# Patient Record
Sex: Female | Born: 1985
Health system: Southern US, Community
[De-identification: ages and names within clinical notes are randomized; demographics above are authoritative.]

## PROBLEM LIST (undated history)

## (undated) DIAGNOSIS — G4730 Sleep apnea, unspecified: Secondary | ICD-10-CM

## (undated) DIAGNOSIS — C189 Malignant neoplasm of colon, unspecified: Secondary | ICD-10-CM

## (undated) DIAGNOSIS — T7840XA Allergy, unspecified, initial encounter: Secondary | ICD-10-CM

## (undated) DIAGNOSIS — J45909 Unspecified asthma, uncomplicated: Secondary | ICD-10-CM

## (undated) DIAGNOSIS — K8020 Calculus of gallbladder without cholecystitis without obstruction: Secondary | ICD-10-CM

## (undated) DIAGNOSIS — K635 Polyp of colon: Secondary | ICD-10-CM

## (undated) DIAGNOSIS — C20 Malignant neoplasm of rectum: Secondary | ICD-10-CM

## (undated) DIAGNOSIS — G4710 Hypersomnia, unspecified: Secondary | ICD-10-CM

## (undated) DIAGNOSIS — E669 Obesity, unspecified: Secondary | ICD-10-CM

## (undated) DIAGNOSIS — K56609 Unspecified intestinal obstruction, unspecified as to partial versus complete obstruction: Secondary | ICD-10-CM

## (undated) HISTORY — PX: COLON SURGERY: SHX602

## (undated) HISTORY — DX: Polyp of colon: K63.5

## (undated) HISTORY — DX: Calculus of gallbladder without cholecystitis without obstruction: K80.20

## (undated) HISTORY — PX: HYSTEROTOMY: SHX1776

## (undated) HISTORY — DX: Sleep apnea, unspecified: G47.30

## (undated) HISTORY — PX: ABDOMINAL HYSTERECTOMY: SHX81

## (undated) HISTORY — DX: Obesity, unspecified: E66.9

## (undated) HISTORY — DX: Malignant neoplasm of colon, unspecified: C18.9

## (undated) HISTORY — PX: CHOLECYSTECTOMY: SHX55

## (undated) HISTORY — DX: Allergy, unspecified, initial encounter: T78.40XA

## (undated) HISTORY — PX: HERNIA REPAIR: SHX51

## (undated) HISTORY — PX: OTHER SURGICAL HISTORY: SHX169

## (undated) HISTORY — DX: Hypersomnia, unspecified: G47.10

## (undated) HISTORY — DX: Unspecified intestinal obstruction, unspecified as to partial versus complete obstruction: K56.609

## (undated) HISTORY — PX: TONSILLECTOMY AND ADENOIDECTOMY: SUR1326

---
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
## 2007-02-22 ENCOUNTER — Ambulatory Visit: Payer: Self-pay

## 2011-09-30 HISTORY — PX: OTHER SURGICAL HISTORY: SHX169

## 2011-09-30 HISTORY — PX: ABDOMINAL HYSTERECTOMY: SHX81

## 2011-09-30 HISTORY — PX: COLON SURGERY: SHX602

## 2011-11-04 ENCOUNTER — Telehealth: Payer: Self-pay | Admitting: Internal Medicine

## 2011-11-04 NOTE — Telephone Encounter (Signed)
[phone] Pt called has a new patient appointment 3/4 would like to be seen sooner if possible Pt having diagestive issues,  Going to bath room several times a day very painful   Pt started gulton free diet to see if this would help 3 weeks ago. Pt does not have primary care dr

## 2011-11-04 NOTE — Telephone Encounter (Signed)
Can I assume you have not found a way to work her in using two 15 minute slots ?

## 2011-11-05 NOTE — Telephone Encounter (Signed)
Appointment rescheduled.

## 2011-11-12 ENCOUNTER — Ambulatory Visit (INDEPENDENT_AMBULATORY_CARE_PROVIDER_SITE_OTHER): Payer: Self-pay | Admitting: Internal Medicine

## 2011-11-12 ENCOUNTER — Encounter: Payer: Self-pay | Admitting: *Deleted

## 2011-11-12 ENCOUNTER — Encounter: Payer: Self-pay | Admitting: Internal Medicine

## 2011-11-12 DIAGNOSIS — G4730 Sleep apnea, unspecified: Secondary | ICD-10-CM | POA: Insufficient documentation

## 2011-11-12 DIAGNOSIS — R197 Diarrhea, unspecified: Secondary | ICD-10-CM

## 2011-11-12 DIAGNOSIS — K529 Noninfective gastroenteritis and colitis, unspecified: Secondary | ICD-10-CM | POA: Insufficient documentation

## 2011-11-12 DIAGNOSIS — F5112 Insufficient sleep syndrome: Secondary | ICD-10-CM | POA: Insufficient documentation

## 2011-11-12 DIAGNOSIS — G4710 Hypersomnia, unspecified: Secondary | ICD-10-CM | POA: Insufficient documentation

## 2011-11-12 LAB — CBC WITH DIFFERENTIAL/PLATELET
Basophils Absolute: 0 10*3/uL (ref 0.0–0.1)
Basophils Relative: 0.4 % (ref 0.0–3.0)
Eosinophils Absolute: 0.2 10*3/uL (ref 0.0–0.7)
Eosinophils Relative: 3.6 % (ref 0.0–5.0)
HCT: 36.9 % (ref 36.0–46.0)
Hemoglobin: 12.1 g/dL (ref 12.0–15.0)
Lymphocytes Relative: 31 % (ref 12.0–46.0)
Lymphs Abs: 1.8 10*3/uL (ref 0.7–4.0)
MCHC: 32.8 g/dL (ref 30.0–36.0)
MCV: 86.1 fl (ref 78.0–100.0)
Monocytes Absolute: 0.4 10*3/uL (ref 0.1–1.0)
Monocytes Relative: 7.3 % (ref 3.0–12.0)
Neutro Abs: 3.4 10*3/uL (ref 1.4–7.7)
Neutrophils Relative %: 57.7 % (ref 43.0–77.0)
Platelets: 219 10*3/uL (ref 150.0–400.0)
RBC: 4.29 Mil/uL (ref 3.87–5.11)
RDW: 14.3 % (ref 11.5–14.6)
WBC: 6 10*3/uL (ref 4.5–10.5)

## 2011-11-12 LAB — COMPREHENSIVE METABOLIC PANEL
ALT: 18 U/L (ref 0–35)
AST: 19 U/L (ref 0–37)
Albumin: 3.9 g/dL (ref 3.5–5.2)
Alkaline Phosphatase: 57 U/L (ref 39–117)
BUN: 14 mg/dL (ref 6–23)
CO2: 24 mEq/L (ref 19–32)
Calcium: 9.2 mg/dL (ref 8.4–10.5)
Chloride: 105 mEq/L (ref 96–112)
Creatinine, Ser: 0.8 mg/dL (ref 0.4–1.2)
GFR: 91.03 mL/min (ref >60.00)
Glucose, Bld: 84 mg/dL (ref 70–99)
Potassium: 3.8 mEq/L (ref 3.5–5.1)
Sodium: 138 mEq/L (ref 135–145)
Total Bilirubin: 0.6 mg/dL (ref 0.3–1.2)
Total Protein: 6.9 g/dL (ref 6.0–8.3)

## 2011-11-12 LAB — MAGNESIUM: Magnesium: 1.9 mg/dL (ref 1.5–2.5)

## 2011-11-12 LAB — TSH: TSH: 0.7 u[IU]/mL (ref 0.35–5.50)

## 2011-11-12 LAB — SEDIMENTATION RATE: Sed Rate: 21 mm/hr (ref 0–22)

## 2011-11-12 MED ORDER — DIPHENOXYLATE-ATROPINE 2.5-0.025 MG PO TABS: 1.0000 | ORAL_TABLET | Freq: Four times a day (QID) | ORAL | Status: AC | PRN

## 2011-11-12 NOTE — Assessment & Plan Note (Signed)
Prior trials of Ritalin in college not helpful.  Now using metidate ., prescribed by Dr. Hazle Coca at [facility].

## 2011-11-12 NOTE — Progress Notes (Signed)
  Subjective:    Patient ID: Rachael Cowan, female    DOB: [birth year], 26 y.o.   MRN: [id number]  HPI  Rachael Cowan is a 26 year old white female who presents today to establish primary care. Chief complaint is chronic diarrhea which has been present since June 2012,  she has been having  8 to 10 ,  small volume liquid stools daily since Junew With no prior history of diarrhea or constipation, there is no family history of irritable bowel disease or inflammatory bowel disease she has occasionally noticed some blood  mixe d in with stools.   She has lost approximately 10 pounds over the last several weeks by switching to a gluten-free diet. The dietary changes have not affect the diarrhea very much she continues to have numerous stools daily . there is no history of  prior travel or camping and  she has not been  not sexually active since before the diarrhea began .  she did not not grow up on a farm.  She has  city water,   she has a long list of environmental allergies and was previously received allergy shots, but has not been taking any antihistamines for several months and  Past Medical History  Diagnosis Date  . Allergy   . Sleep apnea 2005    resolved with ENT surgery,  Erline Hau  . Hypersomnolence disorder 2005    managed with metidate   No current outpatient prescriptions on file prior to visit.    .      Review of Systems  Constitutional: Negative for fever, chills and unexpected weight change.  HENT: Negative for hearing loss, ear pain, nosebleeds, congestion, sore throat, facial swelling, rhinorrhea, sneezing, mouth sores, trouble swallowing, neck pain, neck stiffness, voice change, postnasal drip, sinus pressure, tinnitus and ear discharge.   Eyes: Negative for pain, discharge, redness and visual disturbance.  Respiratory: Negative for cough, chest tightness, shortness of breath, wheezing and stridor.   Cardiovascular: Negative for chest pain, palpitations and leg swelling.    Gastrointestinal: Positive for diarrhea.  Musculoskeletal: Negative for myalgias and arthralgias.  Skin: Negative for color change.  Neurological: Negative for dizziness, weakness, light-headedness and headaches.  Hematological: Negative for adenopathy.  Psychiatric/Behavioral: Positive for sleep disturbance.       Objective:   Physical Exam  Constitutional: She is oriented to person, place, and time. She appears well-developed and well-nourished.  HENT:  Mouth/Throat: Oropharynx is clear and moist.  Eyes: EOM are normal. Pupils are equal, round, and reactive to light. No scleral icterus.  Neck: Normal range of motion. Neck supple. No JVD present. No thyromegaly present.  Cardiovascular: Normal rate, regular rhythm, normal heart sounds and intact distal pulses.   Pulmonary/Chest: Effort normal and breath sounds normal.  Abdominal: Soft. Bowel sounds are normal. She exhibits no mass. There is no tenderness.  Musculoskeletal: Normal range of motion. She exhibits no edema.  Lymphadenopathy:    She has no cervical adenopathy.  Neurological: She is alert and oriented to person, place, and time.  Skin: Skin is warm and dry.  Psychiatric: She has a normal mood and affect.          Assessment & Plan:

## 2011-11-12 NOTE — Patient Instructions (Signed)
We will call you with the referral to the GI clinic for evaluation of your diarrhea and will call you with the results of your blood and stool tests  You may use the lomotil up to 4 times daily to control your diarrhea.

## 2011-11-14 ENCOUNTER — Telehealth: Payer: Self-pay | Admitting: *Deleted

## 2011-11-14 ENCOUNTER — Encounter: Payer: Self-pay | Admitting: Internal Medicine

## 2011-11-14 ENCOUNTER — Other Ambulatory Visit: Payer: BC Managed Care – PPO

## 2011-11-14 DIAGNOSIS — K529 Noninfective gastroenteritis and colitis, unspecified: Secondary | ICD-10-CM

## 2011-11-14 DIAGNOSIS — Z1211 Encounter for screening for malignant neoplasm of colon: Secondary | ICD-10-CM

## 2011-11-14 LAB — CELIAC DISEASE AB SCREEN W/RFX
Deamidated Gliadin Abs, IgA: 2 U (ref 0–19)
Immunoglobulin A, (IgA) QN, Serum: 116 mg/dL (ref 91–414)
t-Transglutaminase (tTG) IgA: <2 U/mL (ref 0–3)

## 2011-11-14 LAB — FECAL OCCULT BLOOD, IMMUNOCHEMICAL: Fecal Occult Bld: POSITIVE

## 2011-11-14 LAB — C-REACTIVE PROTEIN: CRP: 6.9 mg/L — ABNORMAL HIGH (ref 0.0–4.9)

## 2011-11-14 NOTE — Assessment & Plan Note (Signed)
Etiology is unclear and given the recurrence of blood in her stools inflammatory bowel disorder versus anal fissure could be at play here. Will check stool studies, electrolytes,  renal function,  thyroid function,  celiac panel and refer to GI for endoscopy. And given her prescription for Lomotil which she can use up to 4 daily.

## 2011-11-14 NOTE — Telephone Encounter (Signed)
NOted

## 2011-11-14 NOTE — Telephone Encounter (Signed)
Nothing to do.  We knew she was having blood in her stools,.  She has other labs pending and is being referred to GI for evaluation

## 2011-11-14 NOTE — Assessment & Plan Note (Signed)
By history, resolved with surgery on her soft palate. She has no symptoms of sleep apnea currently.

## 2011-11-14 NOTE — Telephone Encounter (Signed)
Got call from lab stating that patient's recent IFOB (stool test) was positive. They will be faxing over result.  Please advise.

## 2011-12-01 ENCOUNTER — Ambulatory Visit: Payer: Self-pay | Admitting: Internal Medicine

## 2011-12-03 LAB — GIARDIA ANTIGEN: Giardia Screen (EIA): NEGATIVE

## 2011-12-03 LAB — FECAL LACTOFERRIN, QUANT: Lactoferrin: POSITIVE

## 2011-12-06 LAB — STOOL CULTURE

## 2011-12-10 ENCOUNTER — Ambulatory Visit: Payer: Self-pay | Admitting: Internal Medicine

## 2012-01-05 ENCOUNTER — Other Ambulatory Visit: Payer: Self-pay

## 2012-01-05 ENCOUNTER — Ambulatory Visit: Payer: Self-pay | Admitting: Oncology

## 2012-01-05 ENCOUNTER — Telehealth: Payer: Self-pay

## 2012-01-05 DIAGNOSIS — C20 Malignant neoplasm of rectum: Secondary | ICD-10-CM

## 2012-01-05 LAB — CBC CANCER CENTER
Basophil #: 0 x10 3/mm (ref 0.0–0.1)
Basophil %: 0.4 %
Eosinophil #: 0.1 x10 3/mm (ref 0.0–0.7)
Eosinophil %: 2.4 %
HCT: 36.4 % (ref 35.0–47.0)
HGB: 12.1 g/dL (ref 12.0–16.0)
Lymphocyte #: 1.8 x10 3/mm (ref 1.0–3.6)
Lymphocyte %: 31.3 %
MCH: 27.6 pg (ref 26.0–34.0)
MCHC: 33.4 g/dL (ref 32.0–36.0)
MCV: 83 fL (ref 80–100)
Monocyte #: 0.5 x10 3/mm (ref 0.0–0.7)
Monocyte %: 8.2 %
Neutrophil #: 3.3 x10 3/mm (ref 1.4–6.5)
Neutrophil %: 57.7 %
Platelet: 245 x10 3/mm (ref 150–440)
RBC: 4.39 10*6/uL (ref 3.80–5.20)
RDW: 15.2 % — ABNORMAL HIGH (ref 11.5–14.5)
WBC: 5.8 x10 3/mm (ref 3.6–11.0)

## 2012-01-05 LAB — COMPREHENSIVE METABOLIC PANEL
Albumin: 3.7 g/dL (ref 3.4–5.0)
Alkaline Phosphatase: 70 U/L (ref 50–136)
Anion Gap: 10 (ref 7–16)
BUN: 15 mg/dL (ref 7–18)
Bilirubin,Total: 0.7 mg/dL (ref 0.2–1.0)
Calcium, Total: 9 mg/dL (ref 8.5–10.1)
Chloride: 105 mmol/L (ref 98–107)
Co2: 27 mmol/L (ref 21–32)
Creatinine: 0.8 mg/dL (ref 0.60–1.30)
EGFR (African American): >60
EGFR (Non-African Amer.): >60
Glucose: 85 mg/dL (ref 65–99)
Osmolality: 283 (ref 275–301)
Potassium: 3.5 mmol/L (ref 3.5–5.1)
SGOT(AST): 19 U/L (ref 15–37)
SGPT (ALT): 26 U/L
Sodium: 142 mmol/L (ref 136–145)
Total Protein: 7 g/dL (ref 6.4–8.2)

## 2012-01-05 NOTE — Telephone Encounter (Signed)
Pt has been instructed and meds reviewed.  She will call with any further questions or concerns.

## 2012-01-06 LAB — CEA: CEA: 19.8 ng/mL — ABNORMAL HIGH (ref 0.0–4.7)

## 2012-01-07 ENCOUNTER — Telehealth: Payer: Self-pay | Admitting: Internal Medicine

## 2012-01-07 DIAGNOSIS — K624 Stenosis of anus and rectum: Secondary | ICD-10-CM

## 2012-01-08 ENCOUNTER — Telehealth: Payer: Self-pay | Admitting: Gastroenterology

## 2012-01-08 ENCOUNTER — Telehealth: Payer: Self-pay | Admitting: Internal Medicine

## 2012-01-08 ENCOUNTER — Telehealth: Payer: Self-pay

## 2012-01-08 NOTE — Telephone Encounter (Signed)
Shawn called and cx the pt for tomorrow she is going to [facility] instead

## 2012-01-08 NOTE — Telephone Encounter (Signed)
Pt's mother had a question about where the pt's procedure will be. I explained to her it would be at [facility] and we discussed her instructions she had no other questions

## 2012-01-08 NOTE — Telephone Encounter (Signed)
I received her colonoscopy report but not the path report on the stricture Dr. Mechele Collin found.  Has his office contacted her with the results ?

## 2012-01-09 ENCOUNTER — Ambulatory Visit (HOSPITAL_COMMUNITY)
Admission: RE | Admit: 2012-01-09 | Payer: BC Managed Care – PPO | Source: Ambulatory Visit | Admitting: Gastroenterology

## 2012-01-09 ENCOUNTER — Encounter (HOSPITAL_COMMUNITY): Admission: RE | Payer: Self-pay | Source: Ambulatory Visit

## 2012-01-09 SURGERY — ULTRASOUND, LOWER GI TRACT, ENDOSCOPIC
Anesthesia: Moderate Sedation

## 2012-01-09 NOTE — Telephone Encounter (Signed)
Ok, thanks  Please let the referring provider know (Dr. Doylene Canning at [place])

## 2012-01-09 NOTE — Telephone Encounter (Signed)
I tried calling patient but she does not have a voicemail set up.  I will try again.

## 2012-01-09 NOTE — Telephone Encounter (Signed)
appt cx with anna at [facility]

## 2012-01-12 NOTE — Telephone Encounter (Signed)
Patient does not have voicemail set up, still no answer. Will try calling again later.

## 2012-01-13 ENCOUNTER — Encounter: Payer: Self-pay | Admitting: Internal Medicine

## 2012-01-13 DIAGNOSIS — Z85048 Personal history of other malignant neoplasm of rectum, rectosigmoid junction, and anus: Secondary | ICD-10-CM | POA: Insufficient documentation

## 2012-01-13 DIAGNOSIS — C20 Malignant neoplasm of rectum: Secondary | ICD-10-CM

## 2012-01-13 NOTE — Assessment & Plan Note (Signed)
Found on March 2013 colonoscopy for bloody diarrhea.  Referred to [facility] andd Mosca,  With plans for referral to [facility] for surgery. Staging underway

## 2012-01-13 NOTE — Telephone Encounter (Signed)
OPened in error.

## 2012-01-14 ENCOUNTER — Ambulatory Visit: Payer: Self-pay | Admitting: Oncology

## 2012-01-14 NOTE — Telephone Encounter (Signed)
I got in touch with patients mother and she stated patient has been contacted and she met with Dr. Koleen Nimrod last week and she is at [facility] right now meeting with another doctor, patient does have rectal cancer.

## 2012-01-22 ENCOUNTER — Ambulatory Visit: Payer: Self-pay | Admitting: Vascular Surgery

## 2012-01-22 LAB — HCG, QUANTITATIVE, PREGNANCY: Beta Hcg, Quant.: <1 m[IU]/mL — ABNORMAL LOW

## 2012-01-28 ENCOUNTER — Ambulatory Visit: Payer: Self-pay | Admitting: Oncology

## 2012-02-02 LAB — CBC CANCER CENTER
Basophil #: 0 x10 3/mm (ref 0.0–0.1)
Basophil %: 0.6 %
Eosinophil #: 0.2 x10 3/mm (ref 0.0–0.7)
Eosinophil %: 3.5 %
HCT: 36.3 % (ref 35.0–47.0)
HGB: 11.4 g/dL — ABNORMAL LOW (ref 12.0–16.0)
Lymphocyte #: 1.9 x10 3/mm (ref 1.0–3.6)
Lymphocyte %: 27 %
MCH: 26.7 pg (ref 26.0–34.0)
MCHC: 31.4 g/dL — ABNORMAL LOW (ref 32.0–36.0)
MCV: 85 fL (ref 80–100)
Monocyte #: 0.5 x10 3/mm (ref 0.2–0.9)
Monocyte %: 6.6 %
Neutrophil #: 4.4 x10 3/mm (ref 1.4–6.5)
Neutrophil %: 62.3 %
Platelet: 180 x10 3/mm (ref 150–440)
RBC: 4.26 10*6/uL (ref 3.80–5.20)
RDW: 15.8 % — ABNORMAL HIGH (ref 11.5–14.5)
WBC: 7.1 x10 3/mm (ref 3.6–11.0)

## 2012-02-02 LAB — COMPREHENSIVE METABOLIC PANEL
Albumin: 3.2 g/dL — ABNORMAL LOW (ref 3.4–5.0)
Alkaline Phosphatase: 77 U/L (ref 50–136)
Anion Gap: 7 (ref 7–16)
BUN: 15 mg/dL (ref 7–18)
Bilirubin,Total: 0.2 mg/dL (ref 0.2–1.0)
Calcium, Total: 8.7 mg/dL (ref 8.5–10.1)
Chloride: 109 mmol/L — ABNORMAL HIGH (ref 98–107)
Co2: 27 mmol/L (ref 21–32)
Creatinine: 0.84 mg/dL (ref 0.60–1.30)
EGFR (African American): >60
EGFR (Non-African Amer.): >60
Glucose: 100 mg/dL — ABNORMAL HIGH (ref 65–99)
Osmolality: 286 (ref 275–301)
Potassium: 4 mmol/L (ref 3.5–5.1)
SGOT(AST): 13 U/L — ABNORMAL LOW (ref 15–37)
SGPT (ALT): 27 U/L
Sodium: 143 mmol/L (ref 136–145)
Total Protein: 6.4 g/dL (ref 6.4–8.2)

## 2012-02-09 LAB — CBC CANCER CENTER
Basophil #: 0 x10 3/mm (ref 0.0–0.1)
Basophil %: 0.6 %
Eosinophil #: 0.1 x10 3/mm (ref 0.0–0.7)
Eosinophil %: 3.6 %
HCT: 35.1 % (ref 35.0–47.0)
HGB: 11.1 g/dL — ABNORMAL LOW (ref 12.0–16.0)
Lymphocyte #: 0.6 x10 3/mm — ABNORMAL LOW (ref 1.0–3.6)
Lymphocyte %: 19.2 %
MCH: 27 pg (ref 26.0–34.0)
MCHC: 31.6 g/dL — ABNORMAL LOW (ref 32.0–36.0)
MCV: 85 fL (ref 80–100)
Monocyte #: 0.3 x10 3/mm (ref 0.2–0.9)
Monocyte %: 8.6 %
Neutrophil #: 2.2 x10 3/mm (ref 1.4–6.5)
Neutrophil %: 68 %
Platelet: 160 x10 3/mm (ref 150–440)
RBC: 4.11 10*6/uL (ref 3.80–5.20)
RDW: 16.3 % — ABNORMAL HIGH (ref 11.5–14.5)
WBC: 3.2 x10 3/mm — ABNORMAL LOW (ref 3.6–11.0)

## 2012-02-16 LAB — CBC CANCER CENTER
Basophil #: 0 x10 3/mm (ref 0.0–0.1)
Basophil %: 0.5 %
Eosinophil #: 0.1 x10 3/mm (ref 0.0–0.7)
Eosinophil %: 5.4 %
HCT: 36.3 % (ref 35.0–47.0)
HGB: 11.6 g/dL — ABNORMAL LOW (ref 12.0–16.0)
Lymphocyte #: 0.5 x10 3/mm — ABNORMAL LOW (ref 1.0–3.6)
Lymphocyte %: 18.1 %
MCH: 27.1 pg (ref 26.0–34.0)
MCHC: 31.9 g/dL — ABNORMAL LOW (ref 32.0–36.0)
MCV: 85 fL (ref 80–100)
Monocyte #: 0.3 x10 3/mm (ref 0.2–0.9)
Monocyte %: 9.9 %
Neutrophil #: 1.7 x10 3/mm (ref 1.4–6.5)
Neutrophil %: 66.1 %
Platelet: 134 x10 3/mm — ABNORMAL LOW (ref 150–440)
RBC: 4.28 10*6/uL (ref 3.80–5.20)
RDW: 16.7 % — ABNORMAL HIGH (ref 11.5–14.5)
WBC: 2.6 x10 3/mm — ABNORMAL LOW (ref 3.6–11.0)

## 2012-02-24 LAB — CBC CANCER CENTER
Basophil #: 0 x10 3/mm (ref 0.0–0.1)
Basophil %: 0.5 %
Eosinophil #: 0.1 x10 3/mm (ref 0.0–0.7)
Eosinophil %: 4.3 %
HCT: 36.6 % (ref 35.0–47.0)
HGB: 11.7 g/dL — ABNORMAL LOW (ref 12.0–16.0)
Lymphocyte #: 0.3 x10 3/mm — ABNORMAL LOW (ref 1.0–3.6)
Lymphocyte %: 10.9 %
MCH: 27.2 pg (ref 26.0–34.0)
MCHC: 32.1 g/dL (ref 32.0–36.0)
MCV: 85 fL (ref 80–100)
Monocyte #: 0.3 x10 3/mm (ref 0.2–0.9)
Monocyte %: 11.3 %
Neutrophil #: 2 x10 3/mm (ref 1.4–6.5)
Neutrophil %: 73 %
Platelet: 125 x10 3/mm — ABNORMAL LOW (ref 150–440)
RBC: 4.32 10*6/uL (ref 3.80–5.20)
RDW: 17.4 % — ABNORMAL HIGH (ref 11.5–14.5)
WBC: 2.8 x10 3/mm — ABNORMAL LOW (ref 3.6–11.0)

## 2012-02-28 ENCOUNTER — Ambulatory Visit: Payer: Self-pay | Admitting: Oncology

## 2012-03-01 LAB — CBC CANCER CENTER
Basophil #: 0 x10 3/mm (ref 0.0–0.1)
Basophil %: 0.6 %
Eosinophil #: 0.1 x10 3/mm (ref 0.0–0.7)
Eosinophil %: 4.6 %
HCT: 36.8 % (ref 35.0–47.0)
HGB: 11.7 g/dL — ABNORMAL LOW (ref 12.0–16.0)
Lymphocyte #: 0.3 x10 3/mm — ABNORMAL LOW (ref 1.0–3.6)
Lymphocyte %: 8.4 %
MCH: 27.1 pg (ref 26.0–34.0)
MCHC: 31.7 g/dL — ABNORMAL LOW (ref 32.0–36.0)
MCV: 85 fL (ref 80–100)
Monocyte #: 0.3 x10 3/mm (ref 0.2–0.9)
Monocyte %: 8.5 %
Neutrophil #: 2.3 x10 3/mm (ref 1.4–6.5)
Neutrophil %: 77.9 %
Platelet: 129 x10 3/mm — ABNORMAL LOW (ref 150–440)
RBC: 4.32 10*6/uL (ref 3.80–5.20)
RDW: 17.8 % — ABNORMAL HIGH (ref 11.5–14.5)
WBC: 3 x10 3/mm — ABNORMAL LOW (ref 3.6–11.0)

## 2012-03-08 LAB — CBC CANCER CENTER
Basophil #: 0 x10 3/mm (ref 0.0–0.1)
Basophil %: 0.3 %
Eosinophil #: 0.1 x10 3/mm (ref 0.0–0.7)
Eosinophil %: 4.5 %
HCT: 35.6 % (ref 35.0–47.0)
HGB: 11.5 g/dL — ABNORMAL LOW (ref 12.0–16.0)
Lymphocyte #: 0.3 x10 3/mm — ABNORMAL LOW (ref 1.0–3.6)
Lymphocyte %: 8.7 %
MCH: 27.3 pg (ref 26.0–34.0)
MCHC: 32.3 g/dL (ref 32.0–36.0)
MCV: 84 fL (ref 80–100)
Monocyte #: 0.4 x10 3/mm (ref 0.2–0.9)
Monocyte %: 10.9 %
Neutrophil #: 2.5 x10 3/mm (ref 1.4–6.5)
Neutrophil %: 75.6 %
Platelet: 159 x10 3/mm (ref 150–440)
RBC: 4.22 10*6/uL (ref 3.80–5.20)
RDW: 17.9 % — ABNORMAL HIGH (ref 11.5–14.5)
WBC: 3.3 x10 3/mm — ABNORMAL LOW (ref 3.6–11.0)

## 2012-03-15 LAB — CBC CANCER CENTER
Basophil #: 0 x10 3/mm (ref 0.0–0.1)
Basophil %: 0.3 %
Eosinophil #: 0.1 x10 3/mm (ref 0.0–0.7)
Eosinophil %: 2.5 %
HCT: 37 % (ref 35.0–47.0)
HGB: 12 g/dL (ref 12.0–16.0)
Lymphocyte #: 0.2 x10 3/mm — ABNORMAL LOW (ref 1.0–3.6)
Lymphocyte %: 6.7 %
MCH: 27.5 pg (ref 26.0–34.0)
MCHC: 32.3 g/dL (ref 32.0–36.0)
MCV: 85 fL (ref 80–100)
Monocyte #: 0.3 x10 3/mm (ref 0.2–0.9)
Monocyte %: 9.9 %
Neutrophil #: 2.7 x10 3/mm (ref 1.4–6.5)
Neutrophil %: 80.6 %
Platelet: 149 x10 3/mm — ABNORMAL LOW (ref 150–440)
RBC: 4.35 10*6/uL (ref 3.80–5.20)
RDW: 18 % — ABNORMAL HIGH (ref 11.5–14.5)
WBC: 3.4 x10 3/mm — ABNORMAL LOW (ref 3.6–11.0)

## 2012-03-16 LAB — CEA: CEA: 5.1 ng/mL — ABNORMAL HIGH (ref 0.0–4.7)

## 2012-03-29 ENCOUNTER — Ambulatory Visit: Payer: Self-pay | Admitting: Oncology

## 2012-04-29 ENCOUNTER — Ambulatory Visit: Payer: Self-pay | Admitting: Oncology

## 2012-05-07 DIAGNOSIS — J45909 Unspecified asthma, uncomplicated: Secondary | ICD-10-CM | POA: Insufficient documentation

## 2012-05-07 DIAGNOSIS — G4710 Hypersomnia, unspecified: Secondary | ICD-10-CM | POA: Insufficient documentation

## 2012-05-07 DIAGNOSIS — G4733 Obstructive sleep apnea (adult) (pediatric): Secondary | ICD-10-CM | POA: Insufficient documentation

## 2012-05-13 DIAGNOSIS — C20 Malignant neoplasm of rectum: Secondary | ICD-10-CM

## 2012-05-13 DIAGNOSIS — Z780 Asymptomatic menopausal state: Secondary | ICD-10-CM | POA: Insufficient documentation

## 2012-05-13 HISTORY — DX: Malignant neoplasm of rectum: C20

## 2012-05-14 DIAGNOSIS — G8918 Other acute postprocedural pain: Secondary | ICD-10-CM | POA: Insufficient documentation

## 2012-05-28 DIAGNOSIS — Z4800 Encounter for change or removal of nonsurgical wound dressing: Secondary | ICD-10-CM | POA: Insufficient documentation

## 2012-05-28 DIAGNOSIS — Z0001 Encounter for general adult medical examination with abnormal findings: Secondary | ICD-10-CM | POA: Insufficient documentation

## 2012-05-30 ENCOUNTER — Ambulatory Visit: Payer: Self-pay | Admitting: Oncology

## 2012-06-01 DIAGNOSIS — R509 Fever, unspecified: Secondary | ICD-10-CM | POA: Insufficient documentation

## 2012-06-01 DIAGNOSIS — R1084 Generalized abdominal pain: Secondary | ICD-10-CM | POA: Insufficient documentation

## 2012-06-29 ENCOUNTER — Ambulatory Visit: Payer: Self-pay | Admitting: Oncology

## 2012-06-30 LAB — BASIC METABOLIC PANEL
Anion Gap: 12 (ref 7–16)
BUN: 8 mg/dL (ref 7–18)
Calcium, Total: 8.9 mg/dL (ref 8.5–10.1)
Chloride: 106 mmol/L (ref 98–107)
Co2: 23 mmol/L (ref 21–32)
Creatinine: 0.73 mg/dL (ref 0.60–1.30)
EGFR (African American): >60
EGFR (Non-African Amer.): >60
Glucose: 103 mg/dL — ABNORMAL HIGH (ref 65–99)
Osmolality: 280 (ref 275–301)
Potassium: 3.7 mmol/L (ref 3.5–5.1)
Sodium: 141 mmol/L (ref 136–145)

## 2012-06-30 LAB — CBC CANCER CENTER
Basophil #: 0.1 x10 3/mm (ref 0.0–0.1)
Basophil %: 2 %
Eosinophil #: 0.1 x10 3/mm (ref 0.0–0.7)
Eosinophil %: 3.1 %
HCT: 31.8 % — ABNORMAL LOW (ref 35.0–47.0)
HGB: 10.3 g/dL — ABNORMAL LOW (ref 12.0–16.0)
Lymphocyte #: 0.5 x10 3/mm — ABNORMAL LOW (ref 1.0–3.6)
Lymphocyte %: 15.3 %
MCH: 27.6 pg (ref 26.0–34.0)
MCHC: 32.4 g/dL (ref 32.0–36.0)
MCV: 85 fL (ref 80–100)
Monocyte #: 0.2 x10 3/mm (ref 0.2–0.9)
Monocyte %: 6.8 %
Neutrophil #: 2.5 x10 3/mm (ref 1.4–6.5)
Neutrophil %: 72.8 %
Platelet: 208 x10 3/mm (ref 150–440)
RBC: 3.74 10*6/uL — ABNORMAL LOW (ref 3.80–5.20)
RDW: 16 % — ABNORMAL HIGH (ref 11.5–14.5)
WBC: 3.4 x10 3/mm — ABNORMAL LOW (ref 3.6–11.0)

## 2012-07-21 LAB — CBC CANCER CENTER
Basophil #: 0 x10 3/mm (ref 0.0–0.1)
Basophil %: 1 %
Eosinophil #: 0 x10 3/mm (ref 0.0–0.7)
Eosinophil %: 2 %
HCT: 32 % — ABNORMAL LOW (ref 35.0–47.0)
HGB: 10.2 g/dL — ABNORMAL LOW (ref 12.0–16.0)
Lymphocyte #: 0.5 x10 3/mm — ABNORMAL LOW (ref 1.0–3.6)
Lymphocyte %: 23.4 %
MCH: 26.7 pg (ref 26.0–34.0)
MCHC: 31.8 g/dL — ABNORMAL LOW (ref 32.0–36.0)
MCV: 84 fL (ref 80–100)
Monocyte #: 0.4 x10 3/mm (ref 0.2–0.9)
Monocyte %: 19.2 %
Neutrophil #: 1.1 x10 3/mm — ABNORMAL LOW (ref 1.4–6.5)
Neutrophil %: 54.4 %
Platelet: 181 x10 3/mm (ref 150–440)
RBC: 3.81 10*6/uL (ref 3.80–5.20)
RDW: 16.2 % — ABNORMAL HIGH (ref 11.5–14.5)
WBC: 1.9 x10 3/mm — CL (ref 3.6–11.0)

## 2012-07-21 LAB — COMPREHENSIVE METABOLIC PANEL
Albumin: 3.2 g/dL — ABNORMAL LOW (ref 3.4–5.0)
Alkaline Phosphatase: 93 U/L (ref 50–136)
Anion Gap: 10 (ref 7–16)
BUN: 9 mg/dL (ref 7–18)
Bilirubin,Total: 0.2 mg/dL (ref 0.2–1.0)
Calcium, Total: 8.8 mg/dL (ref 8.5–10.1)
Chloride: 108 mmol/L — ABNORMAL HIGH (ref 98–107)
Co2: 24 mmol/L (ref 21–32)
Creatinine: 0.72 mg/dL (ref 0.60–1.30)
EGFR (African American): >60
EGFR (Non-African Amer.): >60
Glucose: 109 mg/dL — ABNORMAL HIGH (ref 65–99)
Osmolality: 282 (ref 275–301)
Potassium: 3.4 mmol/L — ABNORMAL LOW (ref 3.5–5.1)
SGOT(AST): 18 U/L (ref 15–37)
SGPT (ALT): 36 U/L (ref 12–78)
Sodium: 142 mmol/L (ref 136–145)
Total Protein: 6.4 g/dL (ref 6.4–8.2)

## 2012-07-22 LAB — CEA: CEA: 0.7 ng/mL (ref 0.0–4.7)

## 2012-07-28 LAB — COMPREHENSIVE METABOLIC PANEL
Albumin: 3.3 g/dL — ABNORMAL LOW (ref 3.4–5.0)
Alkaline Phosphatase: 198 U/L — ABNORMAL HIGH (ref 50–136)
Anion Gap: 13 (ref 7–16)
BUN: 13 mg/dL (ref 7–18)
Bilirubin,Total: 1 mg/dL (ref 0.2–1.0)
Calcium, Total: 9.3 mg/dL (ref 8.5–10.1)
Chloride: 104 mmol/L (ref 98–107)
Co2: 23 mmol/L (ref 21–32)
Creatinine: 0.73 mg/dL (ref 0.60–1.30)
EGFR (African American): >60
EGFR (Non-African Amer.): >60
Glucose: 94 mg/dL (ref 65–99)
Osmolality: 279 (ref 275–301)
Potassium: 4 mmol/L (ref 3.5–5.1)
SGOT(AST): 806 U/L — ABNORMAL HIGH (ref 15–37)
SGPT (ALT): 399 U/L — ABNORMAL HIGH (ref 12–78)
Sodium: 140 mmol/L (ref 136–145)
Total Protein: 6.9 g/dL (ref 6.4–8.2)

## 2012-07-28 LAB — CBC CANCER CENTER
Basophil #: 0 x10 3/mm (ref 0.0–0.1)
Basophil %: 0.6 %
Eosinophil #: 0 x10 3/mm (ref 0.0–0.7)
Eosinophil %: 1.1 %
HCT: 34 % — ABNORMAL LOW (ref 35.0–47.0)
HGB: 10.8 g/dL — ABNORMAL LOW (ref 12.0–16.0)
Lymphocyte #: 0.6 x10 3/mm — ABNORMAL LOW (ref 1.0–3.6)
Lymphocyte %: 15.6 %
MCH: 26.5 pg (ref 26.0–34.0)
MCHC: 31.8 g/dL — ABNORMAL LOW (ref 32.0–36.0)
MCV: 83 fL (ref 80–100)
Monocyte #: 0.3 x10 3/mm (ref 0.2–0.9)
Monocyte %: 7.2 %
Neutrophil #: 3.1 x10 3/mm (ref 1.4–6.5)
Neutrophil %: 75.5 %
Platelet: 160 x10 3/mm (ref 150–440)
RBC: 4.08 10*6/uL (ref 3.80–5.20)
RDW: 16.8 % — ABNORMAL HIGH (ref 11.5–14.5)
WBC: 4.1 x10 3/mm (ref 3.6–11.0)

## 2012-07-28 LAB — MAGNESIUM: Magnesium: 1.8 mg/dL

## 2012-07-30 ENCOUNTER — Ambulatory Visit: Payer: Self-pay | Admitting: Oncology

## 2012-08-04 LAB — COMPREHENSIVE METABOLIC PANEL
Albumin: 3.3 g/dL — ABNORMAL LOW (ref 3.4–5.0)
Alkaline Phosphatase: 120 U/L (ref 50–136)
Anion Gap: 13 (ref 7–16)
BUN: 17 mg/dL (ref 7–18)
Bilirubin,Total: 0.2 mg/dL (ref 0.2–1.0)
Calcium, Total: 8.7 mg/dL (ref 8.5–10.1)
Chloride: 100 mmol/L (ref 98–107)
Co2: 26 mmol/L (ref 21–32)
Creatinine: 0.8 mg/dL (ref 0.60–1.30)
EGFR (African American): >60
EGFR (Non-African Amer.): >60
Glucose: 92 mg/dL (ref 65–99)
Osmolality: 279 (ref 275–301)
Potassium: 3.6 mmol/L (ref 3.5–5.1)
SGOT(AST): 35 U/L (ref 15–37)
SGPT (ALT): 181 U/L — ABNORMAL HIGH (ref 12–78)
Sodium: 139 mmol/L (ref 136–145)
Total Protein: 6.7 g/dL (ref 6.4–8.2)

## 2012-08-04 LAB — CBC CANCER CENTER
Basophil #: 0 x10 3/mm (ref 0.0–0.1)
Basophil %: 0.7 %
Eosinophil #: 0.1 x10 3/mm (ref 0.0–0.7)
Eosinophil %: 1.9 %
HCT: 33.9 % — ABNORMAL LOW (ref 35.0–47.0)
HGB: 10.8 g/dL — ABNORMAL LOW (ref 12.0–16.0)
Lymphocyte #: 0.7 x10 3/mm — ABNORMAL LOW (ref 1.0–3.6)
Lymphocyte %: 21.7 %
MCH: 26.7 pg (ref 26.0–34.0)
MCHC: 31.9 g/dL — ABNORMAL LOW (ref 32.0–36.0)
MCV: 84 fL (ref 80–100)
Monocyte #: 0.3 x10 3/mm (ref 0.2–0.9)
Monocyte %: 7.8 %
Neutrophil #: 2.3 x10 3/mm (ref 1.4–6.5)
Neutrophil %: 67.9 %
Platelet: 173 x10 3/mm (ref 150–440)
RBC: 4.04 10*6/uL (ref 3.80–5.20)
RDW: 16.8 % — ABNORMAL HIGH (ref 11.5–14.5)
WBC: 3.4 x10 3/mm — ABNORMAL LOW (ref 3.6–11.0)

## 2012-08-11 LAB — CBC CANCER CENTER
Basophil #: 0 x10 3/mm (ref 0.0–0.1)
Basophil %: 0.8 %
Eosinophil #: 0.1 x10 3/mm (ref 0.0–0.7)
Eosinophil %: 2.4 %
HCT: 33.2 % — ABNORMAL LOW (ref 35.0–47.0)
HGB: 10.7 g/dL — ABNORMAL LOW (ref 12.0–16.0)
Lymphocyte #: 0.8 x10 3/mm — ABNORMAL LOW (ref 1.0–3.6)
Lymphocyte %: 26.1 %
MCH: 26.9 pg (ref 26.0–34.0)
MCHC: 32.1 g/dL (ref 32.0–36.0)
MCV: 84 fL (ref 80–100)
Monocyte #: 0.2 x10 3/mm (ref 0.2–0.9)
Monocyte %: 7.8 %
Neutrophil #: 1.8 x10 3/mm (ref 1.4–6.5)
Neutrophil %: 62.9 %
Platelet: 140 x10 3/mm — ABNORMAL LOW (ref 150–440)
RBC: 3.97 10*6/uL (ref 3.80–5.20)
RDW: 17.6 % — ABNORMAL HIGH (ref 11.5–14.5)
WBC: 2.9 x10 3/mm — ABNORMAL LOW (ref 3.6–11.0)

## 2012-08-11 LAB — COMPREHENSIVE METABOLIC PANEL
Albumin: 3.3 g/dL — ABNORMAL LOW (ref 3.4–5.0)
Alkaline Phosphatase: 103 U/L (ref 50–136)
Anion Gap: 13 (ref 7–16)
BUN: 11 mg/dL (ref 7–18)
Bilirubin,Total: 0.3 mg/dL (ref 0.2–1.0)
Calcium, Total: 8.9 mg/dL (ref 8.5–10.1)
Chloride: 105 mmol/L (ref 98–107)
Co2: 23 mmol/L (ref 21–32)
Creatinine: 0.91 mg/dL (ref 0.60–1.30)
EGFR (African American): >60
EGFR (Non-African Amer.): >60
Glucose: 95 mg/dL (ref 65–99)
Osmolality: 280 (ref 275–301)
Potassium: 3.3 mmol/L — ABNORMAL LOW (ref 3.5–5.1)
SGOT(AST): 17 U/L (ref 15–37)
SGPT (ALT): 45 U/L (ref 12–78)
Sodium: 141 mmol/L (ref 136–145)
Total Protein: 6.7 g/dL (ref 6.4–8.2)

## 2012-08-18 LAB — CBC CANCER CENTER
Basophil #: 0 x10 3/mm (ref 0.0–0.1)
Basophil %: 1.2 %
Eosinophil #: 0.1 x10 3/mm (ref 0.0–0.7)
Eosinophil %: 3.4 %
HCT: 34.4 % — ABNORMAL LOW (ref 35.0–47.0)
HGB: 11.2 g/dL — ABNORMAL LOW (ref 12.0–16.0)
Lymphocyte #: 0.8 x10 3/mm — ABNORMAL LOW (ref 1.0–3.6)
Lymphocyte %: 40.1 %
MCH: 26.7 pg (ref 26.0–34.0)
MCHC: 32.5 g/dL (ref 32.0–36.0)
MCV: 82 fL (ref 80–100)
Monocyte #: 0.1 x10 3/mm — ABNORMAL LOW (ref 0.2–0.9)
Monocyte %: 7.4 %
Neutrophil #: 0.9 x10 3/mm — ABNORMAL LOW (ref 1.4–6.5)
Neutrophil %: 47.9 %
Platelet: 156 x10 3/mm (ref 150–440)
RBC: 4.19 10*6/uL (ref 3.80–5.20)
RDW: 17.7 % — ABNORMAL HIGH (ref 11.5–14.5)
WBC: 2 x10 3/mm — CL (ref 3.6–11.0)

## 2012-08-18 LAB — COMPREHENSIVE METABOLIC PANEL
Albumin: 3.4 g/dL (ref 3.4–5.0)
Alkaline Phosphatase: 101 U/L (ref 50–136)
Anion Gap: 10 (ref 7–16)
BUN: 16 mg/dL (ref 7–18)
Bilirubin,Total: 0.3 mg/dL (ref 0.2–1.0)
Calcium, Total: 9.1 mg/dL (ref 8.5–10.1)
Chloride: 102 mmol/L (ref 98–107)
Co2: 28 mmol/L (ref 21–32)
Creatinine: 1.02 mg/dL (ref 0.60–1.30)
EGFR (African American): >60
EGFR (Non-African Amer.): >60
Glucose: 110 mg/dL — ABNORMAL HIGH (ref 65–99)
Osmolality: 281 (ref 275–301)
Potassium: 3.4 mmol/L — ABNORMAL LOW (ref 3.5–5.1)
SGOT(AST): 17 U/L (ref 15–37)
SGPT (ALT): 34 U/L (ref 12–78)
Sodium: 140 mmol/L (ref 136–145)
Total Protein: 6.8 g/dL (ref 6.4–8.2)

## 2012-08-25 LAB — COMPREHENSIVE METABOLIC PANEL
Albumin: 3.3 g/dL — ABNORMAL LOW (ref 3.4–5.0)
Alkaline Phosphatase: 105 U/L (ref 50–136)
Anion Gap: 14 (ref 7–16)
BUN: 11 mg/dL (ref 7–18)
Bilirubin,Total: 0.4 mg/dL (ref 0.2–1.0)
Calcium, Total: 8.9 mg/dL (ref 8.5–10.1)
Chloride: 103 mmol/L (ref 98–107)
Co2: 23 mmol/L (ref 21–32)
Creatinine: 0.85 mg/dL (ref 0.60–1.30)
EGFR (African American): >60
EGFR (Non-African Amer.): >60
Glucose: 96 mg/dL (ref 65–99)
Osmolality: 279 (ref 275–301)
Potassium: 3.1 mmol/L — ABNORMAL LOW (ref 3.5–5.1)
SGOT(AST): 18 U/L (ref 15–37)
SGPT (ALT): 26 U/L (ref 12–78)
Sodium: 140 mmol/L (ref 136–145)
Total Protein: 6.6 g/dL (ref 6.4–8.2)

## 2012-08-25 LAB — CBC CANCER CENTER
Basophil #: 0 x10 3/mm (ref 0.0–0.1)
Basophil %: 0.8 %
Eosinophil #: 0 x10 3/mm (ref 0.0–0.7)
Eosinophil %: 2.4 %
HCT: 32.4 % — ABNORMAL LOW (ref 35.0–47.0)
HGB: 10.7 g/dL — ABNORMAL LOW (ref 12.0–16.0)
Lymphocyte #: 0.5 x10 3/mm — ABNORMAL LOW (ref 1.0–3.6)
Lymphocyte %: 26.7 %
MCH: 26.4 pg (ref 26.0–34.0)
MCHC: 32.9 g/dL (ref 32.0–36.0)
MCV: 80 fL (ref 80–100)
Monocyte #: 0.3 x10 3/mm (ref 0.2–0.9)
Monocyte %: 13.5 %
Neutrophil #: 1.1 x10 3/mm — ABNORMAL LOW (ref 1.4–6.5)
Neutrophil %: 56.6 %
Platelet: 117 x10 3/mm — ABNORMAL LOW (ref 150–440)
RBC: 4.04 10*6/uL (ref 3.80–5.20)
RDW: 17.6 % — ABNORMAL HIGH (ref 11.5–14.5)
WBC: 2 x10 3/mm — CL (ref 3.6–11.0)

## 2012-08-29 ENCOUNTER — Ambulatory Visit: Payer: Self-pay | Admitting: Oncology

## 2012-09-01 LAB — CBC CANCER CENTER
Basophil #: 0 x10 3/mm (ref 0.0–0.1)
Basophil %: 1.2 %
Eosinophil #: 0 x10 3/mm (ref 0.0–0.7)
Eosinophil %: 2.1 %
HCT: 34 % — ABNORMAL LOW (ref 35.0–47.0)
HGB: 11.3 g/dL — ABNORMAL LOW (ref 12.0–16.0)
Lymphocyte #: 0.7 x10 3/mm — ABNORMAL LOW (ref 1.0–3.6)
Lymphocyte %: 32.4 %
MCH: 27.1 pg (ref 26.0–34.0)
MCHC: 33.3 g/dL (ref 32.0–36.0)
MCV: 81 fL (ref 80–100)
Monocyte #: 0.3 x10 3/mm (ref 0.2–0.9)
Monocyte %: 15.1 %
Neutrophil #: 1.1 x10 3/mm — ABNORMAL LOW (ref 1.4–6.5)
Neutrophil %: 49.2 %
Platelet: 183 x10 3/mm (ref 150–440)
RBC: 4.18 10*6/uL (ref 3.80–5.20)
RDW: 18 % — ABNORMAL HIGH (ref 11.5–14.5)
WBC: 2.2 x10 3/mm — ABNORMAL LOW (ref 3.6–11.0)

## 2012-09-08 LAB — COMPREHENSIVE METABOLIC PANEL
Albumin: 3.8 g/dL (ref 3.4–5.0)
Alkaline Phosphatase: 102 U/L (ref 50–136)
Anion Gap: 11 (ref 7–16)
BUN: 17 mg/dL (ref 7–18)
Bilirubin,Total: 0.2 mg/dL (ref 0.2–1.0)
Calcium, Total: 8.9 mg/dL (ref 8.5–10.1)
Chloride: 101 mmol/L (ref 98–107)
Co2: 26 mmol/L (ref 21–32)
Creatinine: 0.92 mg/dL (ref 0.60–1.30)
EGFR (African American): >60
EGFR (Non-African Amer.): >60
Glucose: 91 mg/dL (ref 65–99)
Osmolality: 277 (ref 275–301)
Potassium: 4 mmol/L (ref 3.5–5.1)
SGOT(AST): 18 U/L (ref 15–37)
SGPT (ALT): 36 U/L (ref 12–78)
Sodium: 138 mmol/L (ref 136–145)
Total Protein: 7.4 g/dL (ref 6.4–8.2)

## 2012-09-08 LAB — CBC CANCER CENTER
Basophil #: 0 x10 3/mm (ref 0.0–0.1)
Basophil %: 1.2 %
Eosinophil #: 0 x10 3/mm (ref 0.0–0.7)
Eosinophil %: 1.5 %
HCT: 38 % (ref 35.0–47.0)
HGB: 12.6 g/dL (ref 12.0–16.0)
Lymphocyte #: 0.8 x10 3/mm — ABNORMAL LOW (ref 1.0–3.6)
Lymphocyte %: 24.1 %
MCH: 26.9 pg (ref 26.0–34.0)
MCHC: 33.2 g/dL (ref 32.0–36.0)
MCV: 81 fL (ref 80–100)
Monocyte #: 0.3 x10 3/mm (ref 0.2–0.9)
Monocyte %: 7.7 %
Neutrophil #: 2.2 x10 3/mm (ref 1.4–6.5)
Neutrophil %: 65.5 %
Platelet: 191 x10 3/mm (ref 150–440)
RBC: 4.68 10*6/uL (ref 3.80–5.20)
RDW: 17.4 % — ABNORMAL HIGH (ref 11.5–14.5)
WBC: 3.3 x10 3/mm — ABNORMAL LOW (ref 3.6–11.0)

## 2012-09-15 LAB — CBC CANCER CENTER
Basophil #: 0 x10 3/mm (ref 0.0–0.1)
Basophil %: 1 %
Eosinophil #: 0.1 x10 3/mm (ref 0.0–0.7)
Eosinophil %: 2.2 %
HCT: 32.7 % — ABNORMAL LOW (ref 35.0–47.0)
HGB: 11 g/dL — ABNORMAL LOW (ref 12.0–16.0)
Lymphocyte #: 0.6 x10 3/mm — ABNORMAL LOW (ref 1.0–3.6)
Lymphocyte %: 22.1 %
MCH: 27.1 pg (ref 26.0–34.0)
MCHC: 33.6 g/dL (ref 32.0–36.0)
MCV: 81 fL (ref 80–100)
Monocyte #: 0.3 x10 3/mm (ref 0.2–0.9)
Monocyte %: 9.6 %
Neutrophil #: 1.8 x10 3/mm (ref 1.4–6.5)
Neutrophil %: 65.1 %
Platelet: 113 x10 3/mm — ABNORMAL LOW (ref 150–440)
RBC: 4.05 10*6/uL (ref 3.80–5.20)
RDW: 18.1 % — ABNORMAL HIGH (ref 11.5–14.5)
WBC: 2.8 x10 3/mm — ABNORMAL LOW (ref 3.6–11.0)

## 2012-09-15 LAB — COMPREHENSIVE METABOLIC PANEL
Albumin: 3.3 g/dL — ABNORMAL LOW (ref 3.4–5.0)
Alkaline Phosphatase: 92 U/L (ref 50–136)
Anion Gap: 12 (ref 7–16)
BUN: 14 mg/dL (ref 7–18)
Bilirubin,Total: 0.3 mg/dL (ref 0.2–1.0)
Calcium, Total: 8.7 mg/dL (ref 8.5–10.1)
Chloride: 103 mmol/L (ref 98–107)
Co2: 24 mmol/L (ref 21–32)
Creatinine: 0.89 mg/dL (ref 0.60–1.30)
EGFR (African American): >60
EGFR (Non-African Amer.): >60
Glucose: 125 mg/dL — ABNORMAL HIGH (ref 65–99)
Osmolality: 279 (ref 275–301)
Potassium: 3.2 mmol/L — ABNORMAL LOW (ref 3.5–5.1)
SGOT(AST): 24 U/L (ref 15–37)
SGPT (ALT): 49 U/L (ref 12–78)
Sodium: 139 mmol/L (ref 136–145)
Total Protein: 6.6 g/dL (ref 6.4–8.2)

## 2012-09-29 ENCOUNTER — Ambulatory Visit: Payer: Self-pay | Admitting: Oncology

## 2012-09-30 LAB — COMPREHENSIVE METABOLIC PANEL
Albumin: 3.2 g/dL — ABNORMAL LOW (ref 3.4–5.0)
Alkaline Phosphatase: 100 U/L (ref 50–136)
Anion Gap: 9 (ref 7–16)
BUN: 18 mg/dL (ref 7–18)
Bilirubin,Total: 0.3 mg/dL (ref 0.2–1.0)
Calcium, Total: 8.6 mg/dL (ref 8.5–10.1)
Chloride: 105 mmol/L (ref 98–107)
Co2: 25 mmol/L (ref 21–32)
Creatinine: 0.84 mg/dL (ref 0.60–1.30)
EGFR (African American): >60
EGFR (Non-African Amer.): >60
Glucose: 128 mg/dL — ABNORMAL HIGH (ref 65–99)
Osmolality: 281 (ref 275–301)
Potassium: 3.5 mmol/L (ref 3.5–5.1)
SGOT(AST): 25 U/L (ref 15–37)
SGPT (ALT): 59 U/L (ref 12–78)
Sodium: 139 mmol/L (ref 136–145)
Total Protein: 6.5 g/dL (ref 6.4–8.2)

## 2012-09-30 LAB — CBC CANCER CENTER
Basophil #: 0 x10 3/mm (ref 0.0–0.1)
Basophil %: 1 %
Eosinophil #: 0.1 x10 3/mm (ref 0.0–0.7)
Eosinophil %: 2.1 %
HCT: 33.5 % — ABNORMAL LOW (ref 35.0–47.0)
HGB: 11.2 g/dL — ABNORMAL LOW (ref 12.0–16.0)
Lymphocyte #: 0.6 x10 3/mm — ABNORMAL LOW (ref 1.0–3.6)
Lymphocyte %: 21.4 %
MCH: 27 pg (ref 26.0–34.0)
MCHC: 33.3 g/dL (ref 32.0–36.0)
MCV: 81 fL (ref 80–100)
Monocyte #: 0.3 x10 3/mm (ref 0.2–0.9)
Monocyte %: 11.1 %
Neutrophil #: 1.8 x10 3/mm (ref 1.4–6.5)
Neutrophil %: 64.4 %
Platelet: 100 x10 3/mm — ABNORMAL LOW (ref 150–440)
RBC: 4.13 10*6/uL (ref 3.80–5.20)
RDW: 18.7 % — ABNORMAL HIGH (ref 11.5–14.5)
WBC: 2.7 x10 3/mm — ABNORMAL LOW (ref 3.6–11.0)

## 2012-10-01 LAB — CEA: CEA: 2.4 ng/mL (ref 0.0–4.7)

## 2012-10-14 LAB — CBC CANCER CENTER
Basophil #: 0 x10 3/mm (ref 0.0–0.1)
Basophil %: 0.9 %
Eosinophil #: 0.1 x10 3/mm (ref 0.0–0.7)
Eosinophil %: 1.4 %
HCT: 34.1 % — ABNORMAL LOW (ref 35.0–47.0)
HGB: 11.6 g/dL — ABNORMAL LOW (ref 12.0–16.0)
Lymphocyte #: 0.7 x10 3/mm — ABNORMAL LOW (ref 1.0–3.6)
Lymphocyte %: 20.9 %
MCH: 27.6 pg (ref 26.0–34.0)
MCHC: 33.9 g/dL (ref 32.0–36.0)
MCV: 81 fL (ref 80–100)
Monocyte #: 0.4 x10 3/mm (ref 0.2–0.9)
Monocyte %: 10.7 %
Neutrophil #: 2.4 x10 3/mm (ref 1.4–6.5)
Neutrophil %: 66.1 %
Platelet: 129 x10 3/mm — ABNORMAL LOW (ref 150–440)
RBC: 4.2 10*6/uL (ref 3.80–5.20)
RDW: 18.8 % — ABNORMAL HIGH (ref 11.5–14.5)
WBC: 3.6 x10 3/mm (ref 3.6–11.0)

## 2012-10-14 LAB — COMPREHENSIVE METABOLIC PANEL
Albumin: 3.3 g/dL — ABNORMAL LOW (ref 3.4–5.0)
Alkaline Phosphatase: 110 U/L (ref 50–136)
Anion Gap: 12 (ref 7–16)
BUN: 14 mg/dL (ref 7–18)
Bilirubin,Total: 0.3 mg/dL (ref 0.2–1.0)
Calcium, Total: 8.6 mg/dL (ref 8.5–10.1)
Chloride: 104 mmol/L (ref 98–107)
Co2: 24 mmol/L (ref 21–32)
Creatinine: 0.93 mg/dL (ref 0.60–1.30)
EGFR (African American): >60
EGFR (Non-African Amer.): >60
Glucose: 115 mg/dL — ABNORMAL HIGH (ref 65–99)
Osmolality: 281 (ref 275–301)
Potassium: 3.7 mmol/L (ref 3.5–5.1)
SGOT(AST): 33 U/L (ref 15–37)
SGPT (ALT): 71 U/L (ref 12–78)
Sodium: 140 mmol/L (ref 136–145)
Total Protein: 6.9 g/dL (ref 6.4–8.2)

## 2012-10-28 LAB — CBC CANCER CENTER
Basophil #: 0 x10 3/mm (ref 0.0–0.1)
Basophil %: 1.1 %
Eosinophil #: 0 x10 3/mm (ref 0.0–0.7)
Eosinophil %: 1.3 %
HCT: 34.7 % — ABNORMAL LOW (ref 35.0–47.0)
HGB: 11.7 g/dL — ABNORMAL LOW (ref 12.0–16.0)
Lymphocyte #: 0.7 x10 3/mm — ABNORMAL LOW (ref 1.0–3.6)
Lymphocyte %: 23.9 %
MCH: 27.6 pg (ref 26.0–34.0)
MCHC: 33.7 g/dL (ref 32.0–36.0)
MCV: 82 fL (ref 80–100)
Monocyte #: 0.4 x10 3/mm (ref 0.2–0.9)
Monocyte %: 13.6 %
Neutrophil #: 1.9 x10 3/mm (ref 1.4–6.5)
Neutrophil %: 60.1 %
Platelet: 85 x10 3/mm — ABNORMAL LOW (ref 150–440)
RBC: 4.23 10*6/uL (ref 3.80–5.20)
RDW: 18.4 % — ABNORMAL HIGH (ref 11.5–14.5)
WBC: 3.1 x10 3/mm — ABNORMAL LOW (ref 3.6–11.0)

## 2012-10-28 LAB — BASIC METABOLIC PANEL
Anion Gap: 12 (ref 7–16)
BUN: 16 mg/dL (ref 7–18)
Calcium, Total: 8.9 mg/dL (ref 8.5–10.1)
Chloride: 102 mmol/L (ref 98–107)
Co2: 26 mmol/L (ref 21–32)
Creatinine: 0.95 mg/dL (ref 0.60–1.30)
EGFR (African American): >60
EGFR (Non-African Amer.): >60
Glucose: 113 mg/dL — ABNORMAL HIGH (ref 65–99)
Osmolality: 281 (ref 275–301)
Potassium: 3.7 mmol/L (ref 3.5–5.1)
Sodium: 140 mmol/L (ref 136–145)

## 2012-10-30 ENCOUNTER — Ambulatory Visit: Payer: Self-pay | Admitting: Oncology

## 2012-11-12 LAB — BASIC METABOLIC PANEL
Anion Gap: 12 (ref 7–16)
BUN: 14 mg/dL (ref 7–18)
Calcium, Total: 8.7 mg/dL (ref 8.5–10.1)
Chloride: 103 mmol/L (ref 98–107)
Co2: 25 mmol/L (ref 21–32)
Creatinine: 0.83 mg/dL (ref 0.60–1.30)
EGFR (African American): >60
EGFR (Non-African Amer.): >60
Glucose: 137 mg/dL — ABNORMAL HIGH (ref 65–99)
Osmolality: 282 (ref 275–301)
Potassium: 3.1 mmol/L — ABNORMAL LOW (ref 3.5–5.1)
Sodium: 140 mmol/L (ref 136–145)

## 2012-11-12 LAB — CBC CANCER CENTER
Basophil #: 0 x10 3/mm (ref 0.0–0.1)
Basophil %: 1.2 %
Eosinophil #: 0 x10 3/mm (ref 0.0–0.7)
Eosinophil %: 2.2 %
HCT: 30.7 % — ABNORMAL LOW (ref 35.0–47.0)
HGB: 10.4 g/dL — ABNORMAL LOW (ref 12.0–16.0)
Lymphocyte #: 0.6 x10 3/mm — ABNORMAL LOW (ref 1.0–3.6)
Lymphocyte %: 26.7 %
MCH: 28.5 pg (ref 26.0–34.0)
MCHC: 34 g/dL (ref 32.0–36.0)
MCV: 84 fL (ref 80–100)
Monocyte #: 0.4 x10 3/mm (ref 0.2–0.9)
Monocyte %: 17 %
Neutrophil #: 1.1 x10 3/mm — ABNORMAL LOW (ref 1.4–6.5)
Neutrophil %: 52.9 %
Platelet: 67 x10 3/mm — ABNORMAL LOW (ref 150–440)
RBC: 3.65 10*6/uL — ABNORMAL LOW (ref 3.80–5.20)
RDW: 19.3 % — ABNORMAL HIGH (ref 11.5–14.5)
WBC: 2.1 x10 3/mm — ABNORMAL LOW (ref 3.6–11.0)

## 2012-11-25 LAB — CBC CANCER CENTER
Basophil #: 0 x10 3/mm (ref 0.0–0.1)
Basophil %: 1.2 %
Eosinophil #: 0 x10 3/mm (ref 0.0–0.7)
Eosinophil %: 1.2 %
HCT: 32.2 % — ABNORMAL LOW (ref 35.0–47.0)
HGB: 11 g/dL — ABNORMAL LOW (ref 12.0–16.0)
Lymphocyte #: 0.8 x10 3/mm — ABNORMAL LOW (ref 1.0–3.6)
Lymphocyte %: 25.1 %
MCH: 29.2 pg (ref 26.0–34.0)
MCHC: 34.1 g/dL (ref 32.0–36.0)
MCV: 86 fL (ref 80–100)
Monocyte #: 0.4 x10 3/mm (ref 0.2–0.9)
Monocyte %: 11.1 %
Neutrophil #: 2 x10 3/mm (ref 1.4–6.5)
Neutrophil %: 61.4 %
Platelet: 154 x10 3/mm (ref 150–440)
RBC: 3.76 10*6/uL — ABNORMAL LOW (ref 3.80–5.20)
RDW: 18.8 % — ABNORMAL HIGH (ref 11.5–14.5)
WBC: 3.2 x10 3/mm — ABNORMAL LOW (ref 3.6–11.0)

## 2012-11-25 LAB — COMPREHENSIVE METABOLIC PANEL
Albumin: 3.1 g/dL — ABNORMAL LOW (ref 3.4–5.0)
Alkaline Phosphatase: 92 U/L (ref 50–136)
Anion Gap: 10 (ref 7–16)
BUN: 18 mg/dL (ref 7–18)
Bilirubin,Total: 0.3 mg/dL (ref 0.2–1.0)
Calcium, Total: 8.6 mg/dL (ref 8.5–10.1)
Chloride: 105 mmol/L (ref 98–107)
Co2: 25 mmol/L (ref 21–32)
Creatinine: 0.91 mg/dL (ref 0.60–1.30)
EGFR (African American): >60
EGFR (Non-African Amer.): >60
Glucose: 125 mg/dL — ABNORMAL HIGH (ref 65–99)
Osmolality: 283 (ref 275–301)
Potassium: 3.3 mmol/L — ABNORMAL LOW (ref 3.5–5.1)
SGOT(AST): 31 U/L (ref 15–37)
SGPT (ALT): 37 U/L (ref 12–78)
Sodium: 140 mmol/L (ref 136–145)
Total Protein: 6.6 g/dL (ref 6.4–8.2)

## 2012-11-27 ENCOUNTER — Ambulatory Visit: Payer: Self-pay | Admitting: Oncology

## 2012-12-09 LAB — CBC CANCER CENTER
Basophil #: 0 x10 3/mm (ref 0.0–0.1)
Basophil %: 0.6 %
Eosinophil #: 0.1 x10 3/mm (ref 0.0–0.7)
Eosinophil %: 2.7 %
HCT: 31.5 % — ABNORMAL LOW (ref 35.0–47.0)
HGB: 10.8 g/dL — ABNORMAL LOW (ref 12.0–16.0)
Lymphocyte #: 0.5 x10 3/mm — ABNORMAL LOW (ref 1.0–3.6)
Lymphocyte %: 17.5 %
MCH: 29.6 pg (ref 26.0–34.0)
MCHC: 34.3 g/dL (ref 32.0–36.0)
MCV: 86 fL (ref 80–100)
Monocyte #: 0.3 x10 3/mm (ref 0.2–0.9)
Monocyte %: 9.8 %
Neutrophil #: 2 x10 3/mm (ref 1.4–6.5)
Neutrophil %: 69.4 %
Platelet: 94 x10 3/mm — ABNORMAL LOW (ref 150–440)
RBC: 3.65 10*6/uL — ABNORMAL LOW (ref 3.80–5.20)
RDW: 18.1 % — ABNORMAL HIGH (ref 11.5–14.5)
WBC: 2.9 x10 3/mm — ABNORMAL LOW (ref 3.6–11.0)

## 2012-12-09 LAB — COMPREHENSIVE METABOLIC PANEL
Albumin: 3.3 g/dL — ABNORMAL LOW (ref 3.4–5.0)
Alkaline Phosphatase: 94 U/L (ref 50–136)
Anion Gap: 11 (ref 7–16)
BUN: 13 mg/dL (ref 7–18)
Bilirubin,Total: 0.5 mg/dL (ref 0.2–1.0)
Calcium, Total: 8.7 mg/dL (ref 8.5–10.1)
Chloride: 103 mmol/L (ref 98–107)
Co2: 25 mmol/L (ref 21–32)
Creatinine: 1.1 mg/dL (ref 0.60–1.30)
EGFR (African American): >60
EGFR (Non-African Amer.): >60
Glucose: 121 mg/dL — ABNORMAL HIGH (ref 65–99)
Osmolality: 279 (ref 275–301)
Potassium: 3.2 mmol/L — ABNORMAL LOW (ref 3.5–5.1)
SGOT(AST): 27 U/L (ref 15–37)
SGPT (ALT): 31 U/L (ref 12–78)
Sodium: 139 mmol/L (ref 136–145)
Total Protein: 6.8 g/dL (ref 6.4–8.2)

## 2012-12-23 LAB — COMPREHENSIVE METABOLIC PANEL
Albumin: 3.2 g/dL — ABNORMAL LOW (ref 3.4–5.0)
Alkaline Phosphatase: 108 U/L (ref 50–136)
Anion Gap: 8 (ref 7–16)
BUN: 13 mg/dL (ref 7–18)
Bilirubin,Total: 0.2 mg/dL (ref 0.2–1.0)
Calcium, Total: 8.7 mg/dL (ref 8.5–10.1)
Chloride: 103 mmol/L (ref 98–107)
Co2: 27 mmol/L (ref 21–32)
Creatinine: 0.89 mg/dL (ref 0.60–1.30)
EGFR (African American): >60
EGFR (Non-African Amer.): >60
Glucose: 95 mg/dL (ref 65–99)
Osmolality: 276 (ref 275–301)
Potassium: 3.6 mmol/L (ref 3.5–5.1)
SGOT(AST): 47 U/L — ABNORMAL HIGH (ref 15–37)
SGPT (ALT): 54 U/L (ref 12–78)
Sodium: 138 mmol/L (ref 136–145)
Total Protein: 6.9 g/dL (ref 6.4–8.2)

## 2012-12-23 LAB — CBC CANCER CENTER
Basophil #: 0 x10 3/mm (ref 0.0–0.1)
Basophil %: 1 %
Eosinophil #: 0 x10 3/mm (ref 0.0–0.7)
Eosinophil %: 1.7 %
HCT: 32.3 % — ABNORMAL LOW (ref 35.0–47.0)
HGB: 10.8 g/dL — ABNORMAL LOW (ref 12.0–16.0)
Lymphocyte #: 0.5 x10 3/mm — ABNORMAL LOW (ref 1.0–3.6)
Lymphocyte %: 20.7 %
MCH: 29 pg (ref 26.0–34.0)
MCHC: 33.6 g/dL (ref 32.0–36.0)
MCV: 87 fL (ref 80–100)
Monocyte #: 0.3 x10 3/mm (ref 0.2–0.9)
Monocyte %: 13.7 %
Neutrophil #: 1.5 x10 3/mm (ref 1.4–6.5)
Neutrophil %: 62.9 %
Platelet: 87 x10 3/mm — ABNORMAL LOW (ref 150–440)
RBC: 3.73 10*6/uL — ABNORMAL LOW (ref 3.80–5.20)
RDW: 17.3 % — ABNORMAL HIGH (ref 11.5–14.5)
WBC: 2.4 x10 3/mm — ABNORMAL LOW (ref 3.6–11.0)

## 2012-12-28 ENCOUNTER — Ambulatory Visit: Payer: Self-pay | Admitting: Oncology

## 2012-12-30 LAB — COMPREHENSIVE METABOLIC PANEL
Albumin: 3.2 g/dL — ABNORMAL LOW (ref 3.4–5.0)
Alkaline Phosphatase: 95 U/L (ref 50–136)
Anion Gap: 11 (ref 7–16)
BUN: 17 mg/dL (ref 7–18)
Bilirubin,Total: 0.3 mg/dL (ref 0.2–1.0)
Calcium, Total: 8.7 mg/dL (ref 8.5–10.1)
Chloride: 102 mmol/L (ref 98–107)
Co2: 25 mmol/L (ref 21–32)
Creatinine: 0.94 mg/dL (ref 0.60–1.30)
EGFR (African American): >60
EGFR (Non-African Amer.): >60
Glucose: 92 mg/dL (ref 65–99)
Osmolality: 277 (ref 275–301)
Potassium: 3.5 mmol/L (ref 3.5–5.1)
SGOT(AST): 33 U/L (ref 15–37)
SGPT (ALT): 40 U/L (ref 12–78)
Sodium: 138 mmol/L (ref 136–145)
Total Protein: 6.9 g/dL (ref 6.4–8.2)

## 2012-12-30 LAB — CBC CANCER CENTER
Basophil #: 0 x10 3/mm (ref 0.0–0.1)
Basophil %: 0.9 %
Eosinophil #: 0 x10 3/mm (ref 0.0–0.7)
Eosinophil %: 0.8 %
HCT: 33.6 % — ABNORMAL LOW (ref 35.0–47.0)
HGB: 11 g/dL — ABNORMAL LOW (ref 12.0–16.0)
Lymphocyte #: 0.6 x10 3/mm — ABNORMAL LOW (ref 1.0–3.6)
Lymphocyte %: 25.3 %
MCH: 28.7 pg (ref 26.0–34.0)
MCHC: 32.9 g/dL (ref 32.0–36.0)
MCV: 87 fL (ref 80–100)
Monocyte #: 0.4 x10 3/mm (ref 0.2–0.9)
Monocyte %: 19.3 %
Neutrophil #: 1.2 x10 3/mm — ABNORMAL LOW (ref 1.4–6.5)
Neutrophil %: 53.7 %
Platelet: 150 x10 3/mm (ref 150–440)
RBC: 3.85 10*6/uL (ref 3.80–5.20)
RDW: 16.9 % — ABNORMAL HIGH (ref 11.5–14.5)
WBC: 2.2 x10 3/mm — ABNORMAL LOW (ref 3.6–11.0)

## 2013-01-20 LAB — COMPREHENSIVE METABOLIC PANEL
Albumin: 3.3 g/dL — ABNORMAL LOW (ref 3.4–5.0)
Alkaline Phosphatase: 105 U/L (ref 50–136)
Anion Gap: 8 (ref 7–16)
BUN: 10 mg/dL (ref 7–18)
Bilirubin,Total: 0.2 mg/dL (ref 0.2–1.0)
Calcium, Total: 8.7 mg/dL (ref 8.5–10.1)
Chloride: 104 mmol/L (ref 98–107)
Co2: 25 mmol/L (ref 21–32)
Creatinine: 0.89 mg/dL (ref 0.60–1.30)
Glucose: 109 mg/dL — ABNORMAL HIGH (ref 65–99)
Osmolality: 273 (ref 275–301)
Potassium: 3.3 mmol/L — ABNORMAL LOW (ref 3.5–5.1)
SGOT(AST): 37 U/L (ref 15–37)
SGPT (ALT): 33 U/L (ref 12–78)
Sodium: 137 mmol/L (ref 136–145)
Total Protein: 6.7 g/dL (ref 6.4–8.2)

## 2013-01-20 LAB — CBC CANCER CENTER
Basophil #: 0 x10 3/mm (ref 0.0–0.1)
Basophil %: 1.1 %
Eosinophil #: 0.1 x10 3/mm (ref 0.0–0.7)
Eosinophil %: 3.2 %
HCT: 33.3 % — ABNORMAL LOW (ref 35.0–47.0)
HGB: 11.1 g/dL — ABNORMAL LOW (ref 12.0–16.0)
Lymphocyte #: 0.6 x10 3/mm — ABNORMAL LOW (ref 1.0–3.6)
Lymphocyte %: 29.8 %
MCH: 29.1 pg (ref 26.0–34.0)
MCHC: 33.2 g/dL (ref 32.0–36.0)
MCV: 88 fL (ref 80–100)
Monocyte #: 0.4 x10 3/mm (ref 0.2–0.9)
Monocyte %: 19.1 %
Neutrophil #: 0.9 x10 3/mm — ABNORMAL LOW (ref 1.4–6.5)
Neutrophil %: 46.8 %
Platelet: 137 x10 3/mm — ABNORMAL LOW (ref 150–440)
RBC: 3.81 10*6/uL (ref 3.80–5.20)
RDW: 16.2 % — ABNORMAL HIGH (ref 11.5–14.5)
WBC: 2 x10 3/mm — CL (ref 3.6–11.0)

## 2013-01-21 LAB — CEA: CEA: 2.1 ng/mL (ref 0.0–4.7)

## 2013-01-27 ENCOUNTER — Ambulatory Visit: Payer: Self-pay | Admitting: Oncology

## 2013-03-29 ENCOUNTER — Ambulatory Visit: Payer: Self-pay | Admitting: Oncology

## 2013-04-22 ENCOUNTER — Ambulatory Visit: Payer: Self-pay | Admitting: Oncology

## 2013-04-25 LAB — CBC CANCER CENTER
Basophil #: 0 x10 3/mm (ref 0.0–0.1)
Basophil %: 0.7 %
Eosinophil #: 0.1 x10 3/mm (ref 0.0–0.7)
Eosinophil %: 1.9 %
HCT: 35 % (ref 35.0–47.0)
HGB: 12.4 g/dL (ref 12.0–16.0)
Lymphocyte #: 0.9 x10 3/mm — ABNORMAL LOW (ref 1.0–3.6)
Lymphocyte %: 20.9 %
MCH: 31.2 pg (ref 26.0–34.0)
MCHC: 35.3 g/dL (ref 32.0–36.0)
MCV: 88 fL (ref 80–100)
Monocyte #: 0.3 x10 3/mm (ref 0.2–0.9)
Monocyte %: 6.6 %
Neutrophil #: 2.9 x10 3/mm (ref 1.4–6.5)
Neutrophil %: 69.9 %
Platelet: 158 x10 3/mm (ref 150–440)
RBC: 3.97 10*6/uL (ref 3.80–5.20)
RDW: 15.2 % — ABNORMAL HIGH (ref 11.5–14.5)
WBC: 4.2 x10 3/mm (ref 3.6–11.0)

## 2013-04-25 LAB — COMPREHENSIVE METABOLIC PANEL
Albumin: 3.4 g/dL (ref 3.4–5.0)
Alkaline Phosphatase: 101 U/L (ref 50–136)
Anion Gap: 9 (ref 7–16)
BUN: 14 mg/dL (ref 7–18)
Bilirubin,Total: 0.2 mg/dL (ref 0.2–1.0)
Calcium, Total: 8.7 mg/dL (ref 8.5–10.1)
Chloride: 106 mmol/L (ref 98–107)
Co2: 26 mmol/L (ref 21–32)
Creatinine: 0.93 mg/dL (ref 0.60–1.30)
EGFR (African American): >60
EGFR (Non-African Amer.): >60
Glucose: 104 mg/dL — ABNORMAL HIGH (ref 65–99)
Osmolality: 282 (ref 275–301)
Potassium: 3.7 mmol/L (ref 3.5–5.1)
SGOT(AST): 19 U/L (ref 15–37)
SGPT (ALT): 30 U/L (ref 12–78)
Sodium: 141 mmol/L (ref 136–145)
Total Protein: 6.6 g/dL (ref 6.4–8.2)

## 2013-04-26 LAB — CEA: CEA: 1.2 ng/mL (ref 0.0–4.7)

## 2013-04-29 ENCOUNTER — Ambulatory Visit: Payer: Self-pay | Admitting: Oncology

## 2013-06-06 ENCOUNTER — Ambulatory Visit: Payer: Self-pay | Admitting: Oncology

## 2013-06-22 ENCOUNTER — Encounter: Payer: Self-pay | Admitting: Internal Medicine

## 2013-06-22 ENCOUNTER — Ambulatory Visit (INDEPENDENT_AMBULATORY_CARE_PROVIDER_SITE_OTHER): Payer: BC Managed Care – PPO | Admitting: Internal Medicine

## 2013-06-22 DIAGNOSIS — G4730 Sleep apnea, unspecified: Secondary | ICD-10-CM

## 2013-06-22 DIAGNOSIS — Z23 Encounter for immunization: Secondary | ICD-10-CM

## 2013-06-22 DIAGNOSIS — F5112 Insufficient sleep syndrome: Secondary | ICD-10-CM

## 2013-06-22 DIAGNOSIS — E785 Hyperlipidemia, unspecified: Secondary | ICD-10-CM

## 2013-06-22 DIAGNOSIS — R5381 Other malaise: Secondary | ICD-10-CM

## 2013-06-22 DIAGNOSIS — E8941 Symptomatic postprocedural ovarian failure: Secondary | ICD-10-CM

## 2013-06-22 DIAGNOSIS — E8940 Asymptomatic postprocedural ovarian failure: Secondary | ICD-10-CM

## 2013-06-22 DIAGNOSIS — C20 Malignant neoplasm of rectum: Secondary | ICD-10-CM

## 2013-06-22 DIAGNOSIS — E669 Obesity, unspecified: Secondary | ICD-10-CM

## 2013-06-22 DIAGNOSIS — G4710 Hypersomnia, unspecified: Secondary | ICD-10-CM

## 2013-06-22 DIAGNOSIS — J3489 Other specified disorders of nose and nasal sinuses: Secondary | ICD-10-CM

## 2013-06-22 DIAGNOSIS — E538 Deficiency of other specified B group vitamins: Secondary | ICD-10-CM

## 2013-06-22 DIAGNOSIS — J309 Allergic rhinitis, unspecified: Secondary | ICD-10-CM

## 2013-06-22 LAB — COMPREHENSIVE METABOLIC PANEL
ALT: 34 U/L (ref 0–35)
AST: 26 U/L (ref 0–37)
Albumin: 3.7 g/dL (ref 3.5–5.2)
Alkaline Phosphatase: 57 U/L (ref 39–117)
BUN: 12 mg/dL (ref 6–23)
CO2: 23 mEq/L (ref 19–32)
Calcium: 9.2 mg/dL (ref 8.4–10.5)
Chloride: 107 mEq/L (ref 96–112)
Creatinine, Ser: 0.6 mg/dL (ref 0.4–1.2)
GFR: 119.59 mL/min (ref >60.00)
Glucose, Bld: 89 mg/dL (ref 70–99)
Potassium: 3.8 mEq/L (ref 3.5–5.1)
Sodium: 137 mEq/L (ref 135–145)
Total Bilirubin: 0.4 mg/dL (ref 0.3–1.2)
Total Protein: 6.7 g/dL (ref 6.0–8.3)

## 2013-06-22 LAB — CBC WITH DIFFERENTIAL/PLATELET
Basophils Absolute: 0 10*3/uL (ref 0.0–0.1)
Basophils Relative: 0.5 % (ref 0.0–3.0)
Eosinophils Absolute: 0.1 10*3/uL (ref 0.0–0.7)
Eosinophils Relative: 2.1 % (ref 0.0–5.0)
HCT: 34.9 % — ABNORMAL LOW (ref 36.0–46.0)
Hemoglobin: 12.2 g/dL (ref 12.0–15.0)
Lymphocytes Relative: 24.6 % (ref 12.0–46.0)
Lymphs Abs: 1 10*3/uL (ref 0.7–4.0)
MCHC: 34.9 g/dL (ref 30.0–36.0)
MCV: 90.8 fl (ref 78.0–100.0)
Monocytes Absolute: 0.3 10*3/uL (ref 0.1–1.0)
Monocytes Relative: 6.8 % (ref 3.0–12.0)
Neutro Abs: 2.6 10*3/uL (ref 1.4–7.7)
Neutrophils Relative %: 66 % (ref 43.0–77.0)
Platelets: 166 10*3/uL (ref 150.0–400.0)
RBC: 3.84 Mil/uL — ABNORMAL LOW (ref 3.87–5.11)
RDW: 14.3 % (ref 11.5–14.6)
WBC: 3.9 10*3/uL — ABNORMAL LOW (ref 4.5–10.5)

## 2013-06-22 LAB — LIPID PANEL
Cholesterol: 216 mg/dL — ABNORMAL HIGH (ref 0–200)
HDL: 85.4 mg/dL (ref >39.00)
Total CHOL/HDL Ratio: 3
Triglycerides: 295 mg/dL — ABNORMAL HIGH (ref 0.0–149.0)
VLDL: 59 mg/dL — ABNORMAL HIGH (ref 0.0–40.0)

## 2013-06-22 LAB — LDL CHOLESTEROL, DIRECT: Direct LDL: 84.6 mg/dL

## 2013-06-22 LAB — VITAMIN B12: Vitamin B-12: 167 pg/mL — ABNORMAL LOW (ref 211–911)

## 2013-06-22 LAB — TSH: TSH: 3.23 u[IU]/mL (ref 0.35–5.50)

## 2013-06-22 MED ORDER — MONTELUKAST SODIUM 10 MG PO TABS: 10.0000 mg | ORAL_TABLET | Freq: Every day | ORAL | Status: DC

## 2013-06-22 MED ORDER — ESTRADIOL 0.5 MG PO TABS: 0.5000 mg | ORAL_TABLET | Freq: Every day | ORAL | Status: DC

## 2013-06-22 MED ORDER — SULFAMETHOXAZOLE-TMP DS 800-160 MG PO TABS: 1.0000 | ORAL_TABLET | Freq: Two times a day (BID) | ORAL | Status: DC

## 2013-06-22 NOTE — Progress Notes (Signed)
Patient ID: Rachael Cowan, female   DOB: May 06, 1986, 27 y.o.   MRN: [id number]  Patient Active Problem List   Diagnosis Date Noted  . B12 deficiency 06/23/2013  . Purulent rhinorrhea 06/23/2013  . Obesity, unspecified 06/23/2013  . Other and unspecified hyperlipidemia 06/23/2013  . Rectal cancer 01/13/2012  . Primary hypersomnolence disorder 11/12/2011  . Sleep apnea     Subjective:  CC:   Chief Complaint  Patient presents with  . Follow-up    after chemo to discuss estrogen levels.    HPI:   Rachael Cowan a 27 y.o. female who presents  With several issues to discuss.  1) Weight gain. Patient was Lost to follow Up after initial establishment of care in Feb 2013 propmted a GI referral for persistent diarrhea with wt loss and subsequent diagnosis of rectal cancer. She underwent total colectomy with permanent ostomy. She also underwent total abdominal hysterectomy and oophorectomy and has been taking hormone replacement therapy since her surgery which has been prescribed by her gynecologist at [facility]. During this ordeal her BMI dropped 28 to < 25  But is njow 33.  She is frustrated at her inability to lose weight despite following a sensible diet. Her nadir was 126 pounds and she is gained 50 pounds since then. She believes that her starting weight was 160 pounds before she started losing weight due to the undiagnosed rectal cancer and diarrhea.  She has been heavy throughout high school and early 92s.  2) nasal crusting and rhinorrhea. She has a history of allergic rhinitis but has had no relief with over-the-counter antihistamines. She has developed crusting and pain in the area of crusting.   Past Medical History  Diagnosis Date  . Allergy   . Sleep apnea 2005    resolved with ENT surgery,  Erline Hau  . Hypersomnolence disorder 2005    managed with metidate    Past Surgical History  Procedure Laterality Date  . Arthroscopic repair acl  2006    right knee Dr Gerre Pebbles at [facility]        The following portions of the patient's history were reviewed and updated as appropriate: Allergies, current medications, and problem list.    Review of Systems:   12 Pt  review of systems was negative except those addressed in the HPI,     History   Social History  . Marital Status: Single    Spouse Name: N/A    Number of Children: N/A  . Years of Education: N/A   Occupational History  . Not on file.   Social History Main Topics  . Smoking status: Never Smoker   . Smokeless tobacco: Not on file  . Alcohol Use: 4.2 oz/week    7 Glasses of wine per week  . Drug Use: Not on file  . Sexual Activity: Not on file   Other Topics Concern  . Not on file   Social History Narrative  . No narrative on file    Objective:  Filed Vitals:   06/22/13 0808  BP: 100/72  Pulse: 70  Temp: 98.1 F (36.7 C)  Resp: 14     General appearance: alert, cooperative and appears stated age Ears: normal TM's and external ear canals both ears Throat: lips, mucosa, and tongue normal; teeth and gums normal Nose:  Bilateral crusting,  Some purulent drainage noted.   Neck: no adenopathy, no carotid bruit, supple, symmetrical, trachea midline and thyroid not enlarged, symmetric, no tenderness/mass/nodules Back: symmetric, no curvature. ROM normal.  No CVA tenderness. Lungs: clear to auscultation bilaterally Heart: regular rate and rhythm, S1, S2 normal, no murmur, click, rub or gallop Abdomen: soft, non-tender; bowel sounds normal; no masses,  no organomegaly Pulses: 2+ and symmetric Skin: Skin color, texture, turgor normal. No rashes or lesions Lymph nodes: Cervical, supraclavicular, and axillary nodes normal.  Assessment and Plan:  B12 deficiency Diagnosed today during comprehensive evaluation for obesity. I suspect that her B12 deficiency was a result of her GI surgery. She will return for B12 injections. I recommend she continue injections  rather than switching to oral.  However she has needle phobia she can use sublingual tablets.  Primary hypersomnolence disorder Managed by Dr. Katrinka Blazing at the [facility] with Metadate  Purulent rhinorrhea Complicated by allergic rhinitis which is uncontrolled. Exam today suggests she may have a superinfection with staph which may be causing her purulent discharge and bleeding. I will treat empirically with Septra DS and repeat a trial of Singulair for allergic rhinitis since over-the-counter antihistamines have failed. Also recommend using petroleum jelly on a Q-tip to moisturize nasal passages once her infection has cleared, along with daily use of simply saline to hydrate passages.  Sleep apnea Given her recent weight gain and fatigue we may need to consider repeating her sleep study. Her history of sleep apnea apparently resolved after surgery on her soft palate.  Obesity, unspecified I have addressed  BMI and recommended a low glycemic index diet utilizing smaller more frequent meals to increase metabolism.  I have also recommended that patient start exercising with a goal of 30 minutes of aerobic exercise a minimum of 5 days per week. Screening for lipid disorders, thyroid and diabetes was done today.    Other and unspecified hyperlipidemia Slight triglyceridemia noted on today's labs is likely due to the fact that she had coffee with cream prior to blood work today we will repeat in 6 months.   A total of 40 minutes was spent with patient more than half of which was spent in counseling, reviewing records from other providers and coordination of care.   Updated Medication List Outpatient Encounter Prescriptions as of 06/22/2013  Medication Sig Dispense Refill  . methylphenidate (METADATE CD) 20 MG CR capsule Take 20 mg by mouth 2 (two) times daily.      . [DISCONTINUED] estrogens, conjugated, (PREMARIN) 0.9 MG tablet Take 0.9 mg by mouth daily. Take daily for 21 days then do not take for 7  days.      Marland Kitchen estradiol (ESTRACE) 0.5 MG tablet Take 1 tablet (0.5 mg total) by mouth daily.  30 tablet  2  . montelukast (SINGULAIR) 10 MG tablet Take 1 tablet (10 mg total) by mouth at bedtime.  30 tablet  3  . sulfamethoxazole-trimethoprim (BACTRIM DS) 800-160 MG per tablet Take 1 tablet by mouth 2 (two) times daily.  14 tablet  0  . [DISCONTINUED] diphenhydrAMINE (NIGHTTIME SLEEP AID) 25 MG tablet Take 2 by mouth at bedtime.       No facility-administered encounter medications on file as of 06/22/2013.     Orders Placed This Encounter  Procedures  . Flu Vaccine QUAD 36+ mos PF IM (Fluarix)  . Vitamin B12  . Folate RBC  . CBC with Differential  . Comprehensive metabolic panel  . TSH  . Lipid panel  . LDL cholesterol, direct    No Follow-up on file.

## 2013-06-22 NOTE — Patient Instructions (Addendum)
You can try  allegra up to 180 mg daily if the singulair is not helpful  Simply saline or Neil's sinus rinse twice daily for your nose  Septra DS to clear up any MRSA (staph infection)  Use vaseline on a q tip for the dryness   I  Reduced your estrogen dose to 0.5 mg   Estradiol .  If flashes worsen,,, increase to 2 tablets daily and call me   Fasting labs today  This is Dr. Chucky May version of a  "Low GI"  Diet:  It will  allow you to lose 4 to 8  lbs  per month if you follow it carefully.  Your goal with exercise is a minimum of 30 minutes of aerobic exercise 5 days per week (Walking does not count once it becomes easy!)    All of the foods can be found at grocery stores and in bulk at [employer].  The Atkins protein bars and shakes are available in more varieties at Target, WalMart and [employer]'s Foods.     7 AM Breakfast:  Choose from the following:  Low carbohydrate Protein  Shakes (I recommend the EAS AdvantEdge "Carb Control" shakes  Or the low carb shakes by Atkins.    2.5 carbs   Arnold's "Sandwhich Thin"toasted  w/ peanut butter (no jelly: about 20 net carbs  "Bagel Thin" with cream cheese and salmon: about 20 carbs   a scrambled egg/bacon/cheese burrito made with Mission's "carb balance" whole wheat tortilla  (about 10 net carbs )   Avoid cereal and bananas, oatmeal and cream of wheat and grits. They are loaded with carbohydrates!   10 AM: high protein snack  Protein bar by Atkins (the snack size, under 200 cal, usually < 6 net carbs).    A stick of cheese:  Around 1 carb,  100 cal     Dannon Light n Fit [place] Yogurt  (80 cal, 8 carbs)  Other so called "protein bars" and Greek yogurts tend to be loaded with carbohydrates.  Remember, in food advertising, the word "energy" is synonymous for " carbohydrate."  Lunch:   A Sandwich using the bread choices listed, Can use any  Eggs,  lunchmeat, grilled meat or canned tuna), avocado, regular mayo/mustard  and cheese.  A Salad using blue  cheese, ranch,  Goddess or vinagrette,  No croutons or "confetti" and no "candied nuts" but regular nuts OK.   No pretzels or chips.  Pickles and miniature sweet peppers are a good low carb alternative that provide a "crunch"  The bread is the only source of carbohydrate in a sandwich and  can be decreased by trying some of these alternatives to traditional loaf bread  Joseph's makes a pita bread and a flat bread that are 50 cal and 4 net carbs available at BJs and WalMart.  This can be toasted to use with hummous as well  Toufayan makes a low carb flatbread that's 100 cal and 9 net carbs available at [employer] and [employer] makes 2 sizes of  Low carb whole wheat tortilla  (The large one is 210 cal and 6 net carbs) Avoid "Low fat dressings, as well as Reyne Dumas and [address] dressings They are loaded with sugar!   3 PM/ Mid day  Snack:  Consider  1 ounce of  almonds, walnuts, pistachios, pecans, peanuts,  Macadamia nuts or a nut medley.  Avoid "granola"; the dried cranberries and raisins are loaded with carbohydrates. Mixed nuts as  long as there are no raisins,  cranberries or dried fruit.     6 PM  Dinner:     Meat/fowl/fish with a green salad, and either broccoli, cauliflower, green beans, spinach, brussel sprouts or  Lima beans. DO NOT BREAD THE PROTEIN!!      There is a low carb pasta by Dreamfield's that is acceptable and tastes great: only 5 digestible carbs/serving.( All grocery stores but BJs carry it )  Try Kai Levins Angelo's chicken piccata or chicken or eggplant parm over low carb pasta.(Lowes and BJs)   Clifton Custard Sanchez's "Carnitas" (pulled pork, no sauce,  0 carbs) or his beef pot roast to make a dinner burrito (at BJ's)  Pesto over low carb pasta (bj's sells a good quality pesto in the center refrigerated section of the deli   Whole wheat pasta is still full of digestible carbs and  Not as low in glycemic index as Dreamfield's.   Brown rice is still rice,  So skip the rice and  noodles if you eat [place] or [place] (or at least limit to 1/2 cup)  9 PM snack :   Breyer's "low carb" fudgsicle or  ice cream bar (Carb Smart line), or  Weight Watcher's ice cream bar , or another "no sugar added" ice cream;  a serving of fresh berries/cherries with whipped cream   Cheese or DANNON'S LlGHT N FIT [place] YOGURT  Avoid bananas, pineapple, grapes  and watermelon on a regular basis because they are high in sugar.  THINK OF THEM AS DESSERT  Remember that snack Substitutions should be less than 10 NET carbs per serving and meals < 20 carbs. Remember to subtract fiber grams to get the "net carbs."

## 2013-06-23 ENCOUNTER — Encounter: Payer: Self-pay | Admitting: *Deleted

## 2013-06-23 DIAGNOSIS — E785 Hyperlipidemia, unspecified: Secondary | ICD-10-CM | POA: Insufficient documentation

## 2013-06-23 DIAGNOSIS — E538 Deficiency of other specified B group vitamins: Secondary | ICD-10-CM | POA: Insufficient documentation

## 2013-06-23 DIAGNOSIS — E669 Obesity, unspecified: Secondary | ICD-10-CM | POA: Insufficient documentation

## 2013-06-23 DIAGNOSIS — J3489 Other specified disorders of nose and nasal sinuses: Secondary | ICD-10-CM | POA: Insufficient documentation

## 2013-06-23 LAB — FOLATE RBC: RBC Folate: 1041 ng/mL (ref >=366)

## 2013-06-23 NOTE — Assessment & Plan Note (Signed)
Slight triglyceridemia noted on today's labs is likely due to the fact that she had coffee with cream prior to blood work today we will repeat in 6 months.

## 2013-06-23 NOTE — Assessment & Plan Note (Signed)
Given her recent weight gain and fatigue we may need to consider repeating her sleep study. Her history of sleep apnea apparently resolved after surgery on her soft palate. 

## 2013-06-23 NOTE — Assessment & Plan Note (Addendum)
Diagnosed today during comprehensive evaluation for obesity. I suspect that her B12 deficiency was a result of her GI surgery. She will return for B12 injections. I recommend she continue injections  rather than switching to oral. However she has needle phobia she can use sublingual tablets.

## 2013-06-23 NOTE — Assessment & Plan Note (Signed)
Managed by Dr. Katrinka Blazing at the [facility] with [facility]

## 2013-06-23 NOTE — Assessment & Plan Note (Signed)
Complicated by allergic rhinitis which is uncontrolled. Exam today suggests she may have a superinfection with staph which may be causing her purulent discharge and bleeding. I will treat empirically with Septra DS and repeat a trial of Singulair for allergic rhinitis since over-the-counter antihistamines have failed.

## 2013-06-23 NOTE — Assessment & Plan Note (Signed)
I have addressed  BMI and recommended a low glycemic index diet utilizing smaller more frequent meals to increase metabolism.  I have also recommended that patient start exercising with a goal of 30 minutes of aerobic exercise a minimum of 5 days per week. Screening for lipid disorders, thyroid and diabetes was  done today.   

## 2013-06-29 ENCOUNTER — Ambulatory Visit: Payer: Self-pay | Admitting: Oncology

## 2013-07-13 ENCOUNTER — Ambulatory Visit (INDEPENDENT_AMBULATORY_CARE_PROVIDER_SITE_OTHER): Payer: BC Managed Care – PPO | Admitting: *Deleted

## 2013-07-13 DIAGNOSIS — E538 Deficiency of other specified B group vitamins: Secondary | ICD-10-CM

## 2013-07-13 MED ORDER — CYANOCOBALAMIN 1000 MCG/ML IJ SOLN
1000.0000 ug | Freq: Once | INTRAMUSCULAR | Status: AC
  Administered 2013-07-13: 1000 ug via INTRAMUSCULAR

## 2013-07-20 ENCOUNTER — Ambulatory Visit (INDEPENDENT_AMBULATORY_CARE_PROVIDER_SITE_OTHER): Payer: BC Managed Care – PPO | Admitting: *Deleted

## 2013-07-20 DIAGNOSIS — E538 Deficiency of other specified B group vitamins: Secondary | ICD-10-CM

## 2013-07-20 MED ORDER — CYANOCOBALAMIN 1000 MCG/ML IJ SOLN
1000.0000 ug | Freq: Once | INTRAMUSCULAR | Status: AC
  Administered 2013-07-20: 1000 ug via INTRAMUSCULAR

## 2013-07-27 ENCOUNTER — Ambulatory Visit (INDEPENDENT_AMBULATORY_CARE_PROVIDER_SITE_OTHER): Payer: BC Managed Care – PPO | Admitting: *Deleted

## 2013-07-27 DIAGNOSIS — E538 Deficiency of other specified B group vitamins: Secondary | ICD-10-CM

## 2013-07-27 MED ORDER — CYANOCOBALAMIN 1000 MCG/ML IJ SOLN
1000.0000 ug | Freq: Once | INTRAMUSCULAR | Status: AC
  Administered 2013-07-27: 1000 ug via INTRAMUSCULAR

## 2013-08-16 ENCOUNTER — Ambulatory Visit: Payer: Self-pay | Admitting: Oncology

## 2013-08-16 LAB — COMPREHENSIVE METABOLIC PANEL
Albumin: 3.4 g/dL (ref 3.4–5.0)
Alkaline Phosphatase: 75 U/L (ref 50–136)
Anion Gap: 8 (ref 7–16)
BUN: 15 mg/dL (ref 7–18)
Bilirubin,Total: 0.3 mg/dL (ref 0.2–1.0)
Calcium, Total: 8.9 mg/dL (ref 8.5–10.1)
Chloride: 104 mmol/L (ref 98–107)
Co2: 27 mmol/L (ref 21–32)
Creatinine: 0.87 mg/dL (ref 0.60–1.30)
EGFR (African American): >60
EGFR (Non-African Amer.): >60
Glucose: 86 mg/dL (ref 65–99)
Osmolality: 278 (ref 275–301)
Potassium: 4 mmol/L (ref 3.5–5.1)
SGOT(AST): 24 U/L (ref 15–37)
SGPT (ALT): 47 U/L (ref 12–78)
Sodium: 139 mmol/L (ref 136–145)
Total Protein: 6.8 g/dL (ref 6.4–8.2)

## 2013-08-16 LAB — CBC CANCER CENTER
Basophil #: 0 x10 3/mm (ref 0.0–0.1)
Basophil %: 0.8 %
Eosinophil #: 0.1 x10 3/mm (ref 0.0–0.7)
Eosinophil %: 1.4 %
HCT: 36.9 % (ref 35.0–47.0)
HGB: 12.6 g/dL (ref 12.0–16.0)
Lymphocyte #: 1.2 x10 3/mm (ref 1.0–3.6)
Lymphocyte %: 28.8 %
MCH: 31.5 pg (ref 26.0–34.0)
MCHC: 34 g/dL (ref 32.0–36.0)
MCV: 92 fL (ref 80–100)
Monocyte #: 0.4 x10 3/mm (ref 0.2–0.9)
Monocyte %: 8.5 %
Neutrophil #: 2.6 x10 3/mm (ref 1.4–6.5)
Neutrophil %: 60.5 %
Platelet: 161 x10 3/mm (ref 150–440)
RBC: 3.99 10*6/uL (ref 3.80–5.20)
RDW: 13.2 % (ref 11.5–14.5)
WBC: 4.3 x10 3/mm (ref 3.6–11.0)

## 2013-08-17 LAB — CEA: CEA: 1.6 ng/mL (ref 0.0–4.7)

## 2013-08-29 ENCOUNTER — Ambulatory Visit: Payer: Self-pay | Admitting: Oncology

## 2013-08-31 ENCOUNTER — Ambulatory Visit: Payer: BC Managed Care – PPO

## 2013-08-31 ENCOUNTER — Ambulatory Visit (INDEPENDENT_AMBULATORY_CARE_PROVIDER_SITE_OTHER): Payer: BC Managed Care – PPO | Admitting: *Deleted

## 2013-08-31 DIAGNOSIS — E538 Deficiency of other specified B group vitamins: Secondary | ICD-10-CM

## 2013-08-31 MED ORDER — CYANOCOBALAMIN 1000 MCG/ML IJ SOLN
1000.0000 ug | Freq: Once | INTRAMUSCULAR | Status: AC
  Administered 2013-08-31: 1000 ug via INTRAMUSCULAR

## 2013-10-04 ENCOUNTER — Ambulatory Visit: Payer: BC Managed Care – PPO

## 2013-12-16 ENCOUNTER — Ambulatory Visit: Payer: Self-pay | Admitting: Oncology

## 2013-12-19 LAB — COMPREHENSIVE METABOLIC PANEL
Albumin: 3.6 g/dL (ref 3.4–5.0)
Alkaline Phosphatase: 78 U/L
Anion Gap: 12 (ref 7–16)
BUN: 12 mg/dL (ref 7–18)
Bilirubin,Total: 0.3 mg/dL (ref 0.2–1.0)
Calcium, Total: 9.1 mg/dL (ref 8.5–10.1)
Chloride: 104 mmol/L (ref 98–107)
Co2: 26 mmol/L (ref 21–32)
Creatinine: 0.89 mg/dL (ref 0.60–1.30)
EGFR (African American): >60
EGFR (Non-African Amer.): >60
Glucose: 85 mg/dL (ref 65–99)
Osmolality: 282 (ref 275–301)
Potassium: 4.3 mmol/L (ref 3.5–5.1)
SGOT(AST): 20 U/L (ref 15–37)
SGPT (ALT): 32 U/L (ref 12–78)
Sodium: 142 mmol/L (ref 136–145)
Total Protein: 7.5 g/dL (ref 6.4–8.2)

## 2013-12-19 LAB — CBC CANCER CENTER
Basophil #: 0 x10 3/mm (ref 0.0–0.1)
Basophil %: 0.5 %
Eosinophil #: 0.1 x10 3/mm (ref 0.0–0.7)
Eosinophil %: 2.1 %
HCT: 38.5 % (ref 35.0–47.0)
HGB: 12.9 g/dL (ref 12.0–16.0)
Lymphocyte #: 1.4 x10 3/mm (ref 1.0–3.6)
Lymphocyte %: 23.3 %
MCH: 30.5 pg (ref 26.0–34.0)
MCHC: 33.5 g/dL (ref 32.0–36.0)
MCV: 91 fL (ref 80–100)
Monocyte #: 0.4 x10 3/mm (ref 0.2–0.9)
Monocyte %: 7.6 %
Neutrophil #: 3.9 x10 3/mm (ref 1.4–6.5)
Neutrophil %: 66.5 %
Platelet: 178 x10 3/mm (ref 150–440)
RBC: 4.23 10*6/uL (ref 3.80–5.20)
RDW: 13.9 % (ref 11.5–14.5)
WBC: 5.9 x10 3/mm (ref 3.6–11.0)

## 2013-12-20 LAB — CEA: CEA: 1 ng/mL (ref 0.0–4.7)

## 2013-12-28 ENCOUNTER — Ambulatory Visit: Payer: Self-pay | Admitting: Oncology

## 2014-05-08 ENCOUNTER — Ambulatory Visit: Payer: Self-pay | Admitting: Oncology

## 2014-05-08 LAB — COMPREHENSIVE METABOLIC PANEL
Albumin: 3.3 g/dL — ABNORMAL LOW (ref 3.4–5.0)
Alkaline Phosphatase: 81 U/L
Anion Gap: 12 (ref 7–16)
BUN: 14 mg/dL (ref 7–18)
Bilirubin,Total: 0.4 mg/dL (ref 0.2–1.0)
Calcium, Total: 8.7 mg/dL (ref 8.5–10.1)
Chloride: 102 mmol/L (ref 98–107)
Co2: 23 mmol/L (ref 21–32)
Creatinine: 0.98 mg/dL (ref 0.60–1.30)
EGFR (African American): >60
EGFR (Non-African Amer.): >60
Glucose: 97 mg/dL (ref 65–99)
Osmolality: 274 (ref 275–301)
Potassium: 4.2 mmol/L (ref 3.5–5.1)
SGOT(AST): 20 U/L (ref 15–37)
SGPT (ALT): 36 U/L
Sodium: 137 mmol/L (ref 136–145)
Total Protein: 6.8 g/dL (ref 6.4–8.2)

## 2014-05-08 LAB — CBC CANCER CENTER
Basophil #: 0 x10 3/mm (ref 0.0–0.1)
Basophil %: 0.8 %
Eosinophil #: 0.1 x10 3/mm (ref 0.0–0.7)
Eosinophil %: 1.7 %
HCT: 38.5 % (ref 35.0–47.0)
HGB: 12.9 g/dL (ref 12.0–16.0)
Lymphocyte #: 1.3 x10 3/mm (ref 1.0–3.6)
Lymphocyte %: 24.9 %
MCH: 31.3 pg (ref 26.0–34.0)
MCHC: 33.5 g/dL (ref 32.0–36.0)
MCV: 94 fL (ref 80–100)
Monocyte #: 0.3 x10 3/mm (ref 0.2–0.9)
Monocyte %: 5.6 %
Neutrophil #: 3.4 x10 3/mm (ref 1.4–6.5)
Neutrophil %: 67 %
Platelet: 178 x10 3/mm (ref 150–440)
RBC: 4.12 10*6/uL (ref 3.80–5.20)
RDW: 13.9 % (ref 11.5–14.5)
WBC: 5.1 x10 3/mm (ref 3.6–11.0)

## 2014-05-08 LAB — BASIC METABOLIC PANEL
BUN: 15 mg/dL (ref 4–21)
Creatinine: 1 mg/dL (ref 0.5–1.1)
Potassium: 4.2 mmol/L (ref 3.4–5.3)
Sodium: 137 mmol/L (ref 137–147)

## 2014-05-08 LAB — HEPATIC FUNCTION PANEL
ALT: 36 U/L — AB (ref 7–35)
AST: 20 U/L (ref 13–35)
Alkaline Phosphatase: 81 U/L (ref 25–125)

## 2014-05-08 LAB — CBC AND DIFFERENTIAL
Hemoglobin: 12.9 g/dL (ref 12.0–16.0)
Platelets: 179 10*3/uL (ref 150–399)
WBC: 5.1 10^3/mL

## 2014-05-10 LAB — CEA: CEA: 0.8 ng/mL (ref 0.0–4.7)

## 2014-05-30 ENCOUNTER — Ambulatory Visit: Payer: Self-pay | Admitting: Oncology

## 2014-06-06 ENCOUNTER — Encounter: Payer: Self-pay | Admitting: Internal Medicine

## 2014-06-06 DIAGNOSIS — C20 Malignant neoplasm of rectum: Secondary | ICD-10-CM

## 2015-01-21 NOTE — Op Note (Signed)
PATIENT NAME:  FARRIE, SANN MR#:  [id number] DATE OF BIRTH:  [birth year]  DATE OF PROCEDURE:  01/22/2012  PREOPERATIVE DIAGNOSIS: Rectal cancer.   POSTOPERATIVE DIAGNOSIS: Rectal cancer.  PROCEDURES:  1. Ultrasound guidance for vascular access, right jugular vein.  2. Fluoroscopic guidance for placement of catheter.  3. Placement of CT compatible Infuse-a-Port, right jugular vein.   SURGEON: Algernon Huxley, MD   ANESTHESIA: Local with moderate conscious sedation.   ESTIMATED BLOOD LOSS: Minimal.   FLUOROSCOPY TIME: Less than one minute.   CONTRAST USED: None.   INDICATION FOR PROCEDURE: The patient is a 29 year old white female with recently diagnosed rectal cancer. She is scheduled to start chemotherapy and radiation within the next couple of weeks and needs a Port-A-Cath for venous access. Risks and benefits were discussed. Informed consent was obtained.   DESCRIPTION OF PROCEDURE: The patient was brought to the Vascular Interventional Radiology Suite. Neck and chest were sterilely prepped and draped and a sterile surgical field was created. The right jugular vein was visualized with ultrasound and found to widely patent. It was then accessed under direct ultrasound guidance without difficulty with a Seldinger needle. J-wire was placed. After skin nick and dilatation, the peel-away sheath was placed over the wire and the wire and dilator were removed. I then anesthetized an area two fingerbreadths below the right clavicle and created a small transverse incision, dissected down to the pectoral fascia and created an inferior pocket where the port would be housed. It was secured with two Prolene sutures to the chest wall. I connected it to the catheter. The catheter was tunneled from the subclavicular incision to the access site. Fluoroscopic guidance was then used to cut the catheter to an appropriate length, placed through the peel-away sheath, and the peel-away sheath was removed. The  catheter tip was placed in excellent location just into the right atrium and there were no kinks within the catheter. The wounds were irrigated with gentamicin impregnated saline. The subclavicular incision was closed with a running 3-0 Vicryl and a running 4-0 Monocryl. The access incision was closed with a single 4-0 Monocryl. The Huber needle was then used to access the port without difficulty. It withdrew blood well and flushed easily with heparinized saline and Dermabond was then placed as a dressing. The patient tolerated the procedure well and was taken to the recovery room in stable condition.   ____________________________ Algernon Huxley, MD jsd:drc D: 01/22/2012 11:56:08 ET T: 01/22/2012 12:54:16 ET JOB#: [id number]  cc: Algernon Huxley, MD, <Dictator> Martie Lee. Oliva Bustard, MD Algernon Huxley MD ELECTRONICALLY SIGNED 01/26/2012 16:37

## 2015-01-21 NOTE — Consult Note (Signed)
Reason for Visit: This 29 year old Female patient presents to the clinic for initial evaluation of .   Referred by Dr. Oliva Bustard.  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 She is also having some problems with painful bowel movements. She was seen by GI and underwent lower endoscopy showing a scatteredmass extending 10 cm from the anal verge. Anus did not appear to be involved. There was some stricture noted. Biopsy was obtained and positive for moderately differentiated invasive adenocarcinoma. Patient has had about a 10 pound weight loss. She underwent PET/CT scan showing PET positive area within the rectum and distal sigmoid colon. Endoscopic ultrasound showed perirectal lymph node by verbal report. Patient has been seen by medical oncology and surgery at [facility]. They are planning to do an MRI scan of her pelvis next week to better delineate possibility of vaginal involvement. She is having no vaginal bleeding at this time. I been asked to consult the patient regarding preoperative chemoradiation.  Past Hx:    Sleep Apnea: 1994   Cancer, Colon or Rectal: 01-Jan-2012  Past, Family and Social History:   Past Medical History positive    Respiratory Sleep apnea    Cancer colon    Past Surgical History Tonsillectomy, ACL surgery    Family History positive    Family History Comments Family history positive for rheumatoid arthritis, depression, hypertension negative for colon cancer.    Social History noncontributory    Additional Past Medical and Surgical History  Accompanied by mother and grandmother today   Allergies:   Cheese: Unknown  NKDA: None  Home Meds:  Home Medications: Medication Instructions Status  Norco 325 mg-5 mg tablet 1 tab(s) orally every 8 hours x 10 days, As Needed Active  Metadate CD 20 mg/24 hr oral capsule, extended release 1 cap(s) orally 2 times a day (with meals) Active   Review of Systems:   General negative    Performance Status (ECOG) 0    Skin negative    Breast negative    Ophthalmologic negative    ENMT negative    Respiratory and Thorax negative    Cardiovascular negative    Gastrointestinal see HPI    Genitourinary negative    Musculoskeletal negative    Neurological negative    Psychiatric negative    Hematology/Lymphatics negative    Endocrine negative    Allergic/Immunologic negative   Physical Exam:  General/Skin/HEENT:   General normal    Skin normal    Eyes normal    ENMT normal    Head and Neck normal    Additional PE Well-developed well-nourished female in NAD. Lungs are clear to A&P cardiac examination shows regular rate and rhythm. Abdomen is benign with no organomegaly or masses identified. No inguinal adenopathy is detected. Positive bowel sounds all 4 quadrants. No masses or organomegaly are identified.   Breasts/Resp/CV/GI/GU:   Respiratory and Thorax normal    Cardiovascular normal    Gastrointestinal normal    Genitourinary normal   MS/Neuro/Psych/Lymph:   Musculoskeletal normal    Neurological normal    Lymphatics  normal   Other Results:  Radiology Results: Nuclear Med:    17-Apr-13 10:10, PET/CT Scan Colorectal Cancer Initial Stage   PET/CT Scan Colorectal Cancer Initial Stage    REASON FOR EXAM:    rectal CA  COMMENTS:       PROCEDURE: PET - PET/CT INIT STAGING COLORECTAL  - Jan 14 2012 10:10AM     RESULT: The patient has a fasting blood glucose level of 100 mg/dL. The   patient received an injection of 11.94 mCi of fluorine-18  labeled   fluorodeoxyglucose in the left antecubital region at 8:26 a.m. with   imaging obtained from the base of the brain into the thighs between 9:35   a.m. and 10:56 a.m. Noncontrast low-dose CT is performed over the same   region for the purposes of attenuation, correction and fusion. The   noncontrast CT, attenuation corrected PET images and fused PET/CT data   are reconstructed utilizing the [employer]Via software in the axial, coronal   and sagittal planes for interpretation. There is no previous similar   study for comparison.  The CT images demonstrate diffuse thickening in the wall of the rectum   and rectosigmoid region with irregular margins and some surrounding   stranding. The region demonstrates a maximum SUV of 10.72 with a mean of   9.39. There is no evidence of adjacent adenopathy or abnormal   localization. Remaining uptake in the pelvis, abdomen and thoracic   regions appears normal. No focal abnormal hepatic localization or mass is   evident. No focal abnormal breast uptake is seen. The thyroid lobes   appear unremarkable. The base of the brain shows no abnormal localization.    IMPRESSION:   1. PET positive area within the rectum and distal sigmoid colon. Given   the history and the degree of uptake this may be secondary to malignant   disease. Differential considerations could alternatively be for an   inflammatory process such as colitis or proctitis. That, however, is felt   to be less likely.  Thank you for the opportunity to contribute to the care of your patient.           Verified By: Sundra Aland, M.D., MD   Assessment and Plan:  Impression:   clinical stage IIIBadenocarcinoma of the rectum in 29 year old female.  Plan:   at this time I discussed the case personally with Dr. Oliva Bustard. Believe patient would be a good candidate for preoperative chemoradiation therapy prior to surgical resection. Patient is a 42 scheduled for MRI scan at [facility] next week I  have scheduled her for CT simulation shortly thereafter. Would plan on delivering approximately 4500 cGy to her pelvis and boost area of tumor involvement up to 5400 cGy using three-dimensional treatment planning. Risks and benefits of treatment including possibility of diarrhea, dysuria, fatigue were all explained to the patient and her family. Will review her MRI scan I have asked him to bring that with her when she returns to our clinic. Appointments were made. She is also scheduled to have a Port-A-Cath placed.  I would like to take this opportunity to thank you for allowing me to continue to participate in this patient's care.  CC Referral:   cc: Dr. Nicole Cella   Electronic Signatures: Baruch Gouty, Roda Shutters (MD)  (Signed 18-Apr-13 11:36)  Authored: HPI, Diagnosis, Past Hx, PFSH, Allergies, Home Meds, ROS, Physical Exam, Other Results, Encounter Assessment and Plan, CC Referring Physician   Last Updated: 18-Apr-13 11:36 by Armstead Peaks (MD)

## 2015-10-03 ENCOUNTER — Other Ambulatory Visit: Payer: Self-pay | Admitting: *Deleted

## 2015-10-03 DIAGNOSIS — C20 Malignant neoplasm of rectum: Secondary | ICD-10-CM

## 2015-10-09 ENCOUNTER — Inpatient Hospital Stay: Payer: BLUE CROSS/BLUE SHIELD | Admitting: Oncology

## 2015-10-09 ENCOUNTER — Inpatient Hospital Stay: Payer: BLUE CROSS/BLUE SHIELD

## 2015-10-09 ENCOUNTER — Ambulatory Visit: Payer: BLUE CROSS/BLUE SHIELD | Admitting: Oncology

## 2015-10-12 ENCOUNTER — Inpatient Hospital Stay (HOSPITAL_BASED_OUTPATIENT_CLINIC_OR_DEPARTMENT_OTHER): Payer: BLUE CROSS/BLUE SHIELD | Admitting: Oncology

## 2015-10-12 ENCOUNTER — Inpatient Hospital Stay: Payer: BLUE CROSS/BLUE SHIELD

## 2015-10-12 ENCOUNTER — Inpatient Hospital Stay: Payer: BLUE CROSS/BLUE SHIELD | Attending: Oncology

## 2015-10-12 VITALS — BP 152/81 | HR 0 | Temp 97.6°F | Wt 217.6 lb

## 2015-10-12 DIAGNOSIS — Z905 Acquired absence of kidney: Secondary | ICD-10-CM | POA: Diagnosis not present

## 2015-10-12 DIAGNOSIS — Z90722 Acquired absence of ovaries, bilateral: Secondary | ICD-10-CM

## 2015-10-12 DIAGNOSIS — Z933 Colostomy status: Secondary | ICD-10-CM

## 2015-10-12 DIAGNOSIS — C20 Malignant neoplasm of rectum: Secondary | ICD-10-CM

## 2015-10-12 DIAGNOSIS — Z9221 Personal history of antineoplastic chemotherapy: Secondary | ICD-10-CM | POA: Diagnosis not present

## 2015-10-12 DIAGNOSIS — Z85048 Personal history of other malignant neoplasm of rectum, rectosigmoid junction, and anus: Secondary | ICD-10-CM | POA: Insufficient documentation

## 2015-10-12 DIAGNOSIS — Z90710 Acquired absence of both cervix and uterus: Secondary | ICD-10-CM

## 2015-10-12 DIAGNOSIS — Z452 Encounter for adjustment and management of vascular access device: Secondary | ICD-10-CM | POA: Insufficient documentation

## 2015-10-12 DIAGNOSIS — Z23 Encounter for immunization: Secondary | ICD-10-CM | POA: Insufficient documentation

## 2015-10-12 DIAGNOSIS — C801 Malignant (primary) neoplasm, unspecified: Secondary | ICD-10-CM

## 2015-10-12 DIAGNOSIS — Z923 Personal history of irradiation: Secondary | ICD-10-CM | POA: Diagnosis not present

## 2015-10-12 DIAGNOSIS — Z79899 Other long term (current) drug therapy: Secondary | ICD-10-CM | POA: Insufficient documentation

## 2015-10-12 DIAGNOSIS — R1010 Upper abdominal pain, unspecified: Secondary | ICD-10-CM | POA: Insufficient documentation

## 2015-10-12 LAB — CBC WITH DIFFERENTIAL/PLATELET
Basophils Absolute: 0 10*3/uL (ref 0–0.1)
Basophils Relative: 1 %
Eosinophils Absolute: 0.1 10*3/uL (ref 0–0.7)
Eosinophils Relative: 2 %
HCT: 39.8 % (ref 35.0–47.0)
Hemoglobin: 13.7 g/dL (ref 12.0–16.0)
Lymphocytes Relative: 31 %
Lymphs Abs: 1.9 10*3/uL (ref 1.0–3.6)
MCH: 30.3 pg (ref 26.0–34.0)
MCHC: 34.4 g/dL (ref 32.0–36.0)
MCV: 88.3 fL (ref 80.0–100.0)
Monocytes Absolute: 0.3 10*3/uL (ref 0.2–0.9)
Monocytes Relative: 4 %
Neutro Abs: 3.6 10*3/uL (ref 1.4–6.5)
Neutrophils Relative %: 62 %
Platelets: 199 10*3/uL (ref 150–440)
RBC: 4.51 MIL/uL (ref 3.80–5.20)
RDW: 14.6 % — ABNORMAL HIGH (ref 11.5–14.5)
WBC: 5.9 10*3/uL (ref 3.6–11.0)

## 2015-10-12 LAB — COMPREHENSIVE METABOLIC PANEL
ALT: 31 U/L (ref 14–54)
AST: 23 U/L (ref 15–41)
Albumin: 4.1 g/dL (ref 3.5–5.0)
Alkaline Phosphatase: 81 U/L (ref 38–126)
Anion gap: 8 (ref 5–15)
BUN: 17 mg/dL (ref 6–20)
CO2: 22 mmol/L (ref 22–32)
Calcium: 9.2 mg/dL (ref 8.9–10.3)
Chloride: 104 mmol/L (ref 101–111)
Creatinine, Ser: 0.67 mg/dL (ref 0.44–1.00)
GFR calc Af Amer: >60 mL/min (ref >60)
GFR calc non Af Amer: >60 mL/min (ref >60)
Glucose, Bld: 135 mg/dL — ABNORMAL HIGH (ref 65–99)
Potassium: 3.7 mmol/L (ref 3.5–5.1)
Sodium: 134 mmol/L — ABNORMAL LOW (ref 135–145)
Total Bilirubin: 0.5 mg/dL (ref 0.3–1.2)
Total Protein: 7.5 g/dL (ref 6.5–8.1)

## 2015-10-12 MED ORDER — HEPARIN SOD (PORK) LOCK FLUSH 100 UNIT/ML IV SOLN
500.0000 [IU] | Freq: Once | INTRAVENOUS | Status: AC
  Administered 2015-10-12: 500 [IU] via INTRAVENOUS
  Filled 2015-10-12: qty 5

## 2015-10-12 MED ORDER — INFLUENZA VAC SPLIT QUAD 0.5 ML IM SUSY
0.5000 mL | PREFILLED_SYRINGE | Freq: Once | INTRAMUSCULAR | Status: AC
  Administered 2015-10-12: 0.5 mL via INTRAMUSCULAR
  Filled 2015-10-12: qty 0.5

## 2015-10-12 MED ORDER — SODIUM CHLORIDE 0.9 % IJ SOLN
10.0000 mL | INTRAMUSCULAR | Status: DC | PRN
  Administered 2015-10-12: 10 mL via INTRAVENOUS
  Filled 2015-10-12: qty 10

## 2015-10-12 NOTE — Progress Notes (Signed)
Rachael Cowan  Telephone:(336) [phone]  Fax:(336) [place] DOB: [birth year]  MR#: [id number]  RWE#:[id number]  Patient Care Team: [name], MD as PCP - General (Internal Medicine)  CHIEF COMPLAINT:  Chief Complaint  Patient presents with  . Rectal Cancer    INTERVAL HISTORY:  Patient is here for continued follow-up and further evaluation regarding history of rectal cancer, diagnosed when she was 30 years old. Patient has not been seen in our office since summer of 2015. She reports overall feeling very well. She is currently living in [place]. She reports some mild upper abdominal discomfort prior to having a bowel movement. She has a colostomy in place, denies any blood in stool or change in color. She denies any nausea, vomiting, or decreased appetite.  REVIEW OF SYSTEMS:   Review of Systems  Constitutional: Negative for fever, chills, weight loss, malaise/fatigue and diaphoresis.  HENT: Negative.   Eyes: Negative.   Respiratory: Negative for cough, hemoptysis, sputum production, shortness of breath and wheezing.   Cardiovascular: Negative for chest pain, palpitations, orthopnea, claudication, leg swelling and PND.  Gastrointestinal: Positive for abdominal pain. Negative for heartburn, nausea, vomiting, diarrhea, constipation, blood in stool and melena.       Occasional abdominal discomfort prior to bowel movements.  Genitourinary: Negative.   Musculoskeletal: Negative.   Skin: Negative.   Neurological: Negative for dizziness, tingling, focal weakness, seizures and weakness.  Endo/Heme/Allergies: Does not bruise/bleed easily.  Psychiatric/Behavioral: Negative for depression. The patient is not nervous/anxious and does not have insomnia.     As per HPI. Otherwise, a complete review of systems is negatve.  ONCOLOGY HISTORY: Oncology History   82. 30 year old lady who was having rectal bleeding over period  of last few months, underwent  colonoscopy on December 31, 2011.  Significant stricture of the distal rectum.  Malignant appearing tumor was found.  Ulcerated lesion.  Scattered area extending from about 10 cm the anal area involving anus itself. T4, N1, M0 (based on endoscopy ultrasound). Patient is also undergoing MRI scan of pelvis. 2. Presented with rectal cancer by colonoscopy.  Invasive adenocarcinoma 3. Starting radiation therapy.  MRI scan reviewed invasin of the vaginal wall 4. Radiation therapy and concurrent chemotherapy may 2013 5. Finished radiation and chemotherapy on March 17, 2012 6. Status post rectosigmoidectomy, hysterectomy and oophorectomy.(May 13, 2012). ypT2 ypNOtumor. 7. Start chemotherapy with FOLFOX (September, 2013) 8. Oxaliplatin held in October 2013 due to elevated liver enzymes. 9. Restarted Oxaliplatin in November 2013. 10. Patient finished FOLFOX chemotherapy in April of 2014 (11th cycle.  Because of neutropenia last treatment was discontinued)      Rectal cancer ([place])   01/13/2012 Initial Diagnosis Rectal cancer [facility]    PAST MEDICAL HISTORY: Past Medical History  Diagnosis Date  . Allergy   . Sleep apnea 2005    resolved with ENT surgery,  [name]  . Hypersomnolence disorder 2005    managed with metidate    PAST SURGICAL HISTORY: Past Surgical History  Procedure Laterality Date  . Arthroscopic repair acl  2006    right knee Dr Donna Christen at [place] [facility] Family History  Problem Relation Age of Onset  . Hyperlipidemia Neg Hx     GYNECOLOGIC HISTORY:  Patient's last menstrual period was 10/25/2011. patient is status post hysterectomy and nephrectomy from 05/13/2012.    ADVANCED DIRECTIVES:    HEALTH MAINTENANCE: Social History  Substance Use Topics  . Smoking status: Never  Smoker   . Smokeless tobacco: Not on file  . Alcohol Use: 4.2 oz/week    7 Glasses of wine per week     Colonoscopy:  PAP:  Bone density:  Lipid panel:  No Known  Allergies  Current Outpatient Prescriptions  Medication Sig Dispense Refill  . estrogens, conjugated, (PREMARIN) 0.9 MG tablet Take 0.9 mg by mouth daily. Take daily for 21 days then do not take for 7 days.    . methylphenidate (METADATE CD) 20 MG CR capsule Take 20 mg by mouth 2 (two) times daily.     No current facility-administered medications for this visit.   Facility-Administered Medications Ordered in Other Visits  Medication Dose Route Frequency Provider Last Rate Last Dose  . heparin lock flush 100 unit/mL  500 Units Intravenous Once Evlyn Kanner, NP      . sodium chloride 0.9 % injection 10 mL  10 mL Intravenous PRN Evlyn Kanner, NP   10 mL at 10/12/15 1152    OBJECTIVE: BP 152/81 mmHg  Pulse 0  Temp(Src) 97.6 F (36.4 C) (Tympanic)  Wt 217 lb 9.5 oz (98.7 kg)  LMP 10/25/2011   Body mass index is 40.45 kg/(m^2).    ECOG FS:0 - Asymptomatic  General: Well-developed, well-nourished, no acute distress. Eyes: Pink conjunctiva, anicteric sclera. HEENT: Normocephalic, moist mucous membranes, clear oropharnyx. Lungs: Clear to auscultation bilaterally. Heart: Regular rate and rhythm. No rubs, murmurs, or gallops. Abdomen: Soft, nontender, nondistended. No organomegaly noted, normoactive bowel sounds. Colostomy in place and functioning well. Musculoskeletal: No edema, cyanosis, or clubbing. Neuro: Alert, answering all questions appropriately. Cranial nerves grossly intact. Skin: No rashes or petechiae noted. Psych: Normal affect. Lymphatics: No cervical, clavicular, or axillary LAD.   LAB RESULTS:  Appointment on 10/12/2015  Component Date Value Ref Range Status  . WBC 10/12/2015 5.9  3.6 - 11.0 K/uL Final  . RBC 10/12/2015 4.51  3.80 - 5.20 MIL/uL Final  . Hemoglobin 10/12/2015 13.7  12.0 - 16.0 g/dL Final  . HCT 10/12/2015 39.8  35.0 - 47.0 % Final  . MCV 10/12/2015 88.3  80.0 - 100.0 fL Final  . MCH 10/12/2015 30.3  26.0 - 34.0 pg Final  . MCHC 10/12/2015  34.4  32.0 - 36.0 g/dL Final  . RDW 10/12/2015 14.6* 11.5 - 14.5 % Final  . Platelets 10/12/2015 199  150 - 440 K/uL Final  . Neutrophils Relative % 10/12/2015 62   Final  . Neutro Abs 10/12/2015 3.6  1.4 - 6.5 K/uL Final  . Lymphocytes Relative 10/12/2015 31   Final  . Lymphs Abs 10/12/2015 1.9  1.0 - 3.6 K/uL Final  . Monocytes Relative 10/12/2015 4   Final  . Monocytes Absolute 10/12/2015 0.3  0.2 - 0.9 K/uL Final  . Eosinophils Relative 10/12/2015 2   Final  . Eosinophils Absolute 10/12/2015 0.1  0 - 0.7 K/uL Final  . Basophils Relative 10/12/2015 1   Final  . Basophils Absolute 10/12/2015 0.0  0 - 0.1 K/uL Final  . Sodium 10/12/2015 134* 135 - 145 mmol/L Final  . Potassium 10/12/2015 3.7  3.5 - 5.1 mmol/L Final  . Chloride 10/12/2015 104  101 - 111 mmol/L Final  . CO2 10/12/2015 22  22 - 32 mmol/L Final  . Glucose, Bld 10/12/2015 135* 65 - 99 mg/dL Final  . BUN 10/12/2015 17  6 - 20 mg/dL Final  . Creatinine, Ser 10/12/2015 0.67  0.44 - 1.00 mg/dL Final  . Calcium 10/12/2015 9.2  8.9 -  10.3 mg/dL Final  . Total Protein 10/12/2015 7.5  6.5 - 8.1 g/dL Final  . Albumin 10/12/2015 4.1  3.5 - 5.0 g/dL Final  . AST 10/12/2015 23  15 - 41 U/L Final  . ALT 10/12/2015 31  14 - 54 U/L Final  . Alkaline Phosphatase 10/12/2015 81  38 - 126 U/L Final  . Total Bilirubin 10/12/2015 0.5  0.3 - 1.2 mg/dL Final  . GFR calc non Af Amer 10/12/2015 >60  >60 mL/min Final  . GFR calc Af Amer 10/12/2015 >60  >60 mL/min Final   Comment: (NOTE) The eGFR has been calculated using the CKD EPI equation. This calculation has not been validated in all clinical situations. eGFR's persistently <60 mL/min signify possible Chronic Kidney Disease.   . Anion gap 10/12/2015 8  5 - 15 Final    STUDIES: No results found.  ASSESSMENT:  Rectal cancer, Locally advanced invasive adenocarcinoma.  PLAN:   1. Invasive rectal cancer, likely advanced disease. T4, N1, M0 tumor. Diagnosed in April 2013. Patient is  status post rectosigmoidectomy, hysterectomy, as well as bilateral oophorectomy. Clinically there is no evidence of recurrent disease upon exam. She does have some intermittent abdominal discomfort prior to bowel movement through colostomy. Patient has not had any imaging in over 1 year. We'll schedule for CT scan of abdomen and pelvis for evaluation. Patient also needs a follow-up colonoscopy, referral to Dr. Vira Agar will be entered.  Following hysterectomy and oophorectomy, patient was placed on estrogen supplement. She is therefore at high risk for development of breast cancer. Would like to go ahead with yearly mammography exams. Mammogram order has been entered in the system discussed with patient who is in agreement.  We'll continue with follow-up after patient has had CT scan as well as mammography. Routine evaluation will then be yearly. Patient expressed understanding and was in agreement with this plan. She also understands that She can call clinic at any time with any questions, concerns, or complaints.   Dr. Oliva Bustard was an active participant in the planning of care for this patient.   Evlyn Kanner, NP   10/12/2015 11:51 AM

## 2015-10-12 NOTE — Progress Notes (Signed)
Patient states she would like a flu shot and needs to have her port flushed.  States she can come on Monday.

## 2015-10-13 LAB — CEA: CEA: 1.1 ng/mL (ref 0.0–4.7)

## 2015-10-16 ENCOUNTER — Ambulatory Visit
Admission: RE | Admit: 2015-10-16 | Discharge: 2015-10-16 | Disposition: A | Discharge status: Home / Self Care | Payer: BLUE CROSS/BLUE SHIELD | Source: Ambulatory Visit | Attending: Family Medicine | Admitting: Family Medicine

## 2015-10-16 DIAGNOSIS — C20 Malignant neoplasm of rectum: Secondary | ICD-10-CM

## 2015-10-16 DIAGNOSIS — Z85048 Personal history of other malignant neoplasm of rectum, rectosigmoid junction, and anus: Secondary | ICD-10-CM | POA: Diagnosis not present

## 2015-10-16 DIAGNOSIS — K435 Parastomal hernia without obstruction or  gangrene: Secondary | ICD-10-CM | POA: Diagnosis not present

## 2015-10-16 HISTORY — DX: Malignant neoplasm of rectum: C20

## 2015-10-16 MED ORDER — IOHEXOL 300 MG/ML  SOLN
100.0000 mL | Freq: Once | INTRAMUSCULAR | Status: AC | PRN
  Administered 2015-10-16: 100 mL via INTRAVENOUS

## 2015-10-16 NOTE — Progress Notes (Signed)
Progress Notes   MARIKO NOWAKOWSKI (MR# [id number])      Progress Notes Info    Author Note Status Last Update User Last Update Date/Time   Evlyn Kanner, NP Sign at close encounter Evlyn Kanner, NP 10/12/2015 11:52 AM    Progress Notes    [facility] [place]  [phone] [phone]     RENDI MAPEL DOB: [birth year] MR#: [id number] JEH#:[id number]  Patient Care Team: [name], MD as PCP - General (Internal Medicine)  CHIEF COMPLAINT:  Chief Complaint  Patient presents with  . Rectal Cancer    INTERVAL HISTORY:  Patient is here for continued follow-up and further evaluation regarding history of rectal cancer, diagnosed when she was 30 years old. Patient has not been seen in our office since summer of 2015. She reports overall feeling very well. She is currently living in [place]. She reports some mild upper abdominal discomfort prior to having a bowel movement. She has a colostomy in place, denies any blood in stool or change in color. She denies any nausea, vomiting, or decreased appetite.  REVIEW OF SYSTEMS:  Review of Systems  Constitutional: Negative for fever, chills, weight loss, malaise/fatigue and diaphoresis.  HENT: Negative.  Eyes: Negative.  Respiratory: Negative for cough, hemoptysis, sputum production, shortness of breath and wheezing.  Cardiovascular: Negative for chest pain, palpitations, orthopnea, claudication, leg swelling and PND.  Gastrointestinal: Positive for abdominal pain. Negative for heartburn, nausea, vomiting, diarrhea, constipation, blood in stool and melena.   Occasional abdominal discomfort prior to bowel movements.  Genitourinary: Negative.  Musculoskeletal: Negative.  Skin: Negative.  Neurological: Negative for dizziness, tingling, focal weakness, seizures and weakness.  Endo/Heme/Allergies: Does not bruise/bleed easily.  Psychiatric/Behavioral: Negative  for depression. The patient is not nervous/anxious and does not have insomnia.    As per HPI. Otherwise, a complete review of systems is negatve.  ONCOLOGY HISTORY: Oncology History   54. 30 year old lady who was having rectal bleeding over period of last few months, underwent colonoscopy on December 31, 2011. Significant stricture of the distal rectum. Malignant appearing tumor was found. Ulcerated lesion. Scattered area extending from about 10 cm the anal area involving anus itself. T4, N1, M0 (based on endoscopy ultrasound). Patient is also undergoing MRI scan of pelvis. 2. Presented with rectal cancer by colonoscopy. Invasive adenocarcinoma 3. Starting radiation therapy. MRI scan reviewed invasin of the vaginal wall 4. Radiation therapy and concurrent chemotherapy may 2013 5. Finished radiation and chemotherapy on March 17, 2012 6. Status post rectosigmoidectomy, hysterectomy and oophorectomy.(May 13, 2012). ypT2 ypNOtumor. 7. Start chemotherapy with FOLFOX (September, 2013) 8. Oxaliplatin held in October 2013 due to elevated liver enzymes. 9. Restarted Oxaliplatin in November 2013. 10. Patient finished FOLFOX chemotherapy in April of 2014 (11th cycle. Because of neutropenia last treatment was discontinued)      Rectal cancer ([place])   01/13/2012 Initial Diagnosis Rectal cancer [facility])    PAST MEDICAL HISTORY: Past Medical History  Diagnosis Date  . Allergy   . Sleep apnea 2005    resolved with ENT surgery, [name]  . Hypersomnolence disorder 2005    managed with metidate    PAST SURGICAL HISTORY: Past Surgical History  Procedure Laterality Date  . Arthroscopic repair acl  2006    right knee Dr Donna Christen at [place] Family History  Problem Relation Age of Onset  . Hyperlipidemia Neg Hx     GYNECOLOGIC HISTORY:  Patient's last menstrual period was 10/25/2011. patient is status post hysterectomy and  nephrectomy from 05/13/2012.   ADVANCED DIRECTIVES:    HEALTH MAINTENANCE: Social History  Substance Use Topics  . Smoking status: Never Smoker   . Smokeless tobacco: Not on file  . Alcohol Use: 4.2 oz/week    7 Glasses of wine per week    Colonoscopy: PAP: Bone density: Lipid panel:  No Known Allergies  Current Outpatient Prescriptions  Medication Sig Dispense Refill  . estrogens, conjugated, (PREMARIN) 0.9 MG tablet Take 0.9 mg by mouth daily. Take daily for 21 days then do not take for 7 days.    . methylphenidate (METADATE CD) 20 MG CR capsule Take 20 mg by mouth 2 (two) times daily.     No current facility-administered medications for this visit.   Facility-Administered Medications Ordered in Other Visits  Medication Dose Route Frequency Provider Last Rate Last Dose  . heparin lock flush 100 unit/mL 500 Units Intravenous Once Evlyn Kanner, NP    . sodium chloride 0.9 % injection 10 mL 10 mL Intravenous PRN Evlyn Kanner, NP  10 mL at 10/12/15 1152    OBJECTIVE: BP 152/81 mmHg  Pulse 0  Temp(Src) 97.6 F (36.4 C) (Tympanic)  Wt 217 lb 9.5 oz (98.7 kg)  LMP 10/25/2011 Body mass index is 40.45 kg/(m^2). ECOG FS:0 - Asymptomatic  General: Well-developed, well-nourished, no acute distress. Eyes: Pink conjunctiva, anicteric sclera. HEENT: Normocephalic, moist mucous membranes, clear oropharnyx. Lungs: Clear to auscultation bilaterally. Heart: Regular rate and rhythm. No rubs, murmurs, or gallops. Abdomen: Soft, nontender, nondistended. No organomegaly noted, normoactive bowel sounds. Colostomy in place and functioning well. Musculoskeletal: No edema, cyanosis, or clubbing. Neuro: Alert, answering all questions appropriately. Cranial nerves grossly intact. Skin: No rashes or petechiae noted. Psych: Normal affect. Lymphatics: No  cervical, clavicular, or axillary LAD.   LAB RESULTS:  Appointment on 10/12/2015  Component Date Value Ref Range Status  . WBC 10/12/2015 5.9  3.6 - 11.0 K/uL Final  . RBC 10/12/2015 4.51  3.80 - 5.20 MIL/uL Final  . Hemoglobin 10/12/2015 13.7  12.0 - 16.0 g/dL Final  . HCT 10/12/2015 39.8  35.0 - 47.0 % Final  . MCV 10/12/2015 88.3  80.0 - 100.0 fL Final  . MCH 10/12/2015 30.3  26.0 - 34.0 pg Final  . MCHC 10/12/2015 34.4  32.0 - 36.0 g/dL Final  . RDW 10/12/2015 14.6* 11.5 - 14.5 % Final  . Platelets 10/12/2015 199  150 - 440 K/uL Final  . Neutrophils Relative % 10/12/2015 62   Final  . Neutro Abs 10/12/2015 3.6  1.4 - 6.5 K/uL Final  . Lymphocytes Relative 10/12/2015 31   Final  . Lymphs Abs 10/12/2015 1.9  1.0 - 3.6 K/uL Final  . Monocytes Relative 10/12/2015 4   Final  . Monocytes Absolute 10/12/2015 0.3  0.2 - 0.9 K/uL Final  . Eosinophils Relative 10/12/2015 2   Final  . Eosinophils Absolute 10/12/2015 0.1  0 - 0.7 K/uL Final  . Basophils Relative 10/12/2015 1   Final  . Basophils Absolute 10/12/2015 0.0  0 - 0.1 K/uL Final  . Sodium 10/12/2015 134* 135 - 145 mmol/L Final  . Potassium 10/12/2015 3.7  3.5 - 5.1 mmol/L Final  . Chloride 10/12/2015 104  101 - 111 mmol/L Final  . CO2 10/12/2015 22  22 - 32 mmol/L Final  . Glucose, Bld 10/12/2015 135* 65 - 99 mg/dL Final  . BUN 10/12/2015 17  6 - 20  mg/dL Final  . Creatinine, Ser 10/12/2015 0.67  0.44 - 1.00 mg/dL Final  . Calcium 10/12/2015 9.2  8.9 - 10.3 mg/dL Final  . Total Protein 10/12/2015 7.5  6.5 - 8.1 g/dL Final  . Albumin 10/12/2015 4.1  3.5 - 5.0 g/dL Final  . AST 10/12/2015 23  15 - 41 U/L Final  . ALT 10/12/2015 31  14 - 54 U/L Final  . Alkaline Phosphatase 10/12/2015 81  38 - 126 U/L Final  . Total Bilirubin  10/12/2015 0.5  0.3 - 1.2 mg/dL Final  . GFR calc non Af Amer 10/12/2015 >60  >60 mL/min Final  . GFR calc Af Amer 10/12/2015 >60  >60 mL/min Final   Comment: (NOTE) The eGFR has been calculated using the CKD EPI equation. This calculation has not been validated in all clinical situations. eGFR's persistently <60 mL/min signify possible Chronic Kidney Disease.   . Anion gap 10/12/2015 8  5 - 15 Final    STUDIES: No results found.  ASSESSMENT:  Rectal cancer, Locally advanced invasive adenocarcinoma.  PLAN:  1. Invasive rectal cancer, likely advanced disease. T4, N1, M0 tumor. Diagnosed in April 2013. Patient is status post rectosigmoidectomy, hysterectomy, as well as bilateral oophorectomy. Clinically there is no evidence of recurrent disease upon exam. She does have some intermittent abdominal discomfort prior to bowel movement through colostomy. Patient has not had any imaging in over 1 year. We'll schedule for CT scan of abdomen and pelvis for evaluation. Patient also needs a follow-up colonoscopy, referral to Dr. Vira Agar will be entered.  Following hysterectomy and oophorectomy, patient was placed on estrogen supplement. She is therefore at high risk for development of breast cancer. Would like to go ahead with yearly mammography exams. Mammogram order has been entered in the system discussed with patient who is in agreement.  We'll continue with follow-up after patient has had CT scan as well as mammography. Routine evaluation will then be yearly. Patient expressed understanding and was in agreement with this plan. She also understands that She can call clinic at any time with any questions, concerns, or complaints.   Dr. Oliva Bustard was an active participant in the planning of care for this patient. Attending's note Patient was seen by me along with nurse practitioner.  All the recommendation with repeating his scan because of abdominal  discomfort Considering mammogram Repeat CEA was 1.1.  All of the lab data has been reviewed. Edith Lord  Evlyn Kanner, NP 10/12/2015 11:51 AM

## 2015-11-05 ENCOUNTER — Other Ambulatory Visit: Payer: Self-pay | Admitting: Family Medicine

## 2015-11-07 ENCOUNTER — Inpatient Hospital Stay: Payer: BLUE CROSS/BLUE SHIELD | Admitting: Oncology

## 2015-11-15 ENCOUNTER — Telehealth: Payer: Self-pay | Admitting: *Deleted

## 2015-11-15 MED ORDER — ESTROGENS CONJUGATED 0.9 MG PO TABS: 0.9000 mg | ORAL_TABLET | Freq: Every day | ORAL | Status: DC

## 2015-11-15 NOTE — Telephone Encounter (Signed)
Refill escribed to pharmacy.  

## 2015-11-19 ENCOUNTER — Inpatient Hospital Stay: Payer: BLUE CROSS/BLUE SHIELD | Admitting: Oncology

## 2015-12-14 ENCOUNTER — Encounter: Payer: Self-pay | Admitting: *Deleted

## 2015-12-14 ENCOUNTER — Encounter
Admission: RE | Disposition: A | Discharge status: Home / Self Care | Payer: Self-pay | Source: Ambulatory Visit | Attending: Unknown Physician Specialty

## 2015-12-14 ENCOUNTER — Ambulatory Visit: Payer: BLUE CROSS/BLUE SHIELD | Admitting: Anesthesiology

## 2015-12-14 ENCOUNTER — Ambulatory Visit
Admission: RE | Admit: 2015-12-14 | Discharge: 2015-12-14 | Disposition: A | Discharge status: Home / Self Care | Payer: BLUE CROSS/BLUE SHIELD | Source: Ambulatory Visit | Attending: Unknown Physician Specialty | Admitting: Unknown Physician Specialty

## 2015-12-14 DIAGNOSIS — Z79899 Other long term (current) drug therapy: Secondary | ICD-10-CM | POA: Insufficient documentation

## 2015-12-14 DIAGNOSIS — Z85048 Personal history of other malignant neoplasm of rectum, rectosigmoid junction, and anus: Secondary | ICD-10-CM | POA: Insufficient documentation

## 2015-12-14 DIAGNOSIS — D125 Benign neoplasm of sigmoid colon: Secondary | ICD-10-CM | POA: Insufficient documentation

## 2015-12-14 DIAGNOSIS — Z79890 Hormone replacement therapy (postmenopausal): Secondary | ICD-10-CM | POA: Diagnosis not present

## 2015-12-14 DIAGNOSIS — Z90710 Acquired absence of both cervix and uterus: Secondary | ICD-10-CM | POA: Diagnosis not present

## 2015-12-14 DIAGNOSIS — Z9889 Other specified postprocedural states: Secondary | ICD-10-CM | POA: Diagnosis not present

## 2015-12-14 DIAGNOSIS — G4710 Hypersomnia, unspecified: Secondary | ICD-10-CM | POA: Diagnosis not present

## 2015-12-14 DIAGNOSIS — L98499 Non-pressure chronic ulcer of skin of other sites with unspecified severity: Secondary | ICD-10-CM | POA: Diagnosis not present

## 2015-12-14 DIAGNOSIS — Z933 Colostomy status: Secondary | ICD-10-CM | POA: Insufficient documentation

## 2015-12-14 HISTORY — PX: COLONOSCOPY WITH PROPOFOL: SHX5780

## 2015-12-14 SURGERY — COLONOSCOPY WITH PROPOFOL
Anesthesia: General

## 2015-12-14 MED ORDER — PROPOFOL 10 MG/ML IV BOLUS
INTRAVENOUS | Status: DC | PRN
  Administered 2015-12-14: 60 mg via INTRAVENOUS

## 2015-12-14 MED ORDER — PROPOFOL 500 MG/50ML IV EMUL
INTRAVENOUS | Status: DC | PRN
Start: 2015-12-14 — End: 2015-12-14
  Administered 2015-12-14: 140 ug/kg/min via INTRAVENOUS

## 2015-12-14 MED ORDER — SODIUM CHLORIDE 0.9 % IV SOLN
INTRAVENOUS | Status: DC
  Administered 2015-12-14: 14:00:00 via INTRAVENOUS

## 2015-12-14 MED ORDER — FENTANYL CITRATE (PF) 100 MCG/2ML IJ SOLN
INTRAMUSCULAR | Status: DC | PRN
  Administered 2015-12-14: 25 ug via INTRAVENOUS

## 2015-12-14 MED ORDER — SODIUM CHLORIDE 0.9 % IV SOLN: INTRAVENOUS | Status: DC

## 2015-12-14 MED ORDER — LIDOCAINE HCL (CARDIAC) 20 MG/ML IV SOLN
INTRAVENOUS | Status: DC | PRN
  Administered 2015-12-14: 30 mg via INTRAVENOUS

## 2015-12-14 MED ORDER — MIDAZOLAM HCL 5 MG/5ML IJ SOLN
INTRAMUSCULAR | Status: DC | PRN
  Administered 2015-12-14: 0.5 mg via INTRAVENOUS

## 2015-12-14 NOTE — H&P (Signed)
   Primary Care Physician:  [name], MD Primary Gastroenterologist:  Dr. Gerlene Burdock  Pre-Procedure History & Physical: HPI:  Rachael Cowan is a 30 y.o. female is here for an colonoscopy.   Past Medical History  Diagnosis Date  . Allergy   . Sleep apnea 2005    resolved with ENT surgery,  [name]  . Hypersomnolence disorder 2005    managed with metidate  . Rectal cancer ([place]) 05/13/2012    Resection with colostomy and hysterectomy.    Past Surgical History  Procedure Laterality Date  . Arthroscopic repair acl  2006    right knee Dr Donna Christen at [facility]  . Abdominal hysterectomy    . Colon surgery      Prior to Admission medications   Medication Sig Start Date End Date Taking? Authorizing Provider  estrogens, conjugated, (PREMARIN) 0.9 MG tablet Take 1 tablet (0.9 mg total) by mouth daily. 11/15/15   Evlyn Kanner, NP  methylphenidate (METADATE CD) 20 MG CR capsule Take 20 mg by mouth 2 (two) times daily.    Historical Provider, MD    Allergies as of 12/04/2015  . (No Known Allergies)    Family History  Problem Relation Age of Onset  . Hyperlipidemia Neg Hx     Social History   Social History  . Marital Status: Single    Spouse Name: N/A  . Number of Children: N/A  . Years of Education: N/A   Occupational History  . Not on file.   Social History Main Topics  . Smoking status: Never Smoker   . Smokeless tobacco: Never Used  . Alcohol Use: 4.2 oz/week    7 Glasses of wine per week  . Drug Use: No  . Sexual Activity: Not on file   Other Topics Concern  . Not on file   Social History Narrative    Review of Systems: See HPI, otherwise negative ROS  Physical Exam: BP 123/80 mmHg  Pulse 97  Temp(Src) 96.3 F (35.7 C) (Tympanic)  Resp 18  Ht 5\' 1"  (1.549 m)  Wt 99.791 kg (220 lb)  BMI 41.59 kg/m2  SpO2 98%  LMP 10/25/2011 General:   Alert,  pleasant and cooperative in NAD Head:  Normocephalic and atraumatic. Neck:  Supple; no masses or  thyromegaly. Lungs:  Clear throughout to auscultation.    Heart:  Regular rate and rhythm. Abdomen:  Soft, nontender and nondistended. Normal bowel sounds, without guarding, and without rebound.   Neurologic:  Alert and  oriented x4;  grossly normal neurologically.  Impression/Plan: Rachael Cowan is here for an colonoscopy to be performed for follow up colon cancer of rectum removed at surgery  Risks, benefits, limitations, and alternatives regarding  colonoscopy have been reviewed with the patient.  Questions have been answered.  All parties agreeable.   Gaylyn Cheers, MD  12/14/2015, 1:43 PM

## 2015-12-14 NOTE — Op Note (Signed)
[facility] Patient Name: Rachael Cowan Procedure Date: 12/14/2015 1:44 PM MRN: [id number] Account #: [id number] Date of Birth: 27-May-1986 Admit Type: Outpatient Age: 30 Room: [facility] ENDO ROOM 1 Gender: Female Note Status: Finalized Procedure:            Colonoscopy Indications:          High risk colon cancer surveillance: Personal history                        of colon cancer Providers:            Manya Silvas, MD Referring MD:         Martie Lee. Oliva Bustard, MD (Referring MD), Deborra Medina, MD                        (Referring MD) Medicines:            Propofol per Anesthesia Complications:        No immediate complications. Procedure:            Pre-Anesthesia Assessment:                       - After reviewing the risks and benefits, the patient                        was deemed in satisfactory condition to undergo the                        procedure.                       After obtaining informed consent, the colonoscope was                        passed under direct vision. Throughout the procedure,                        the patient's blood pressure, pulse, and oxygen                        saturations were monitored continuously. The                        Colonoscope was introduced through the anus and                        advanced to the the cecum, identified by appendiceal                        orifice and ileocecal valve. The colonoscopy was                        performed without difficulty. The patient tolerated the                        procedure well. The quality of the bowel preparation                        was excellent. Findings:      Scope passed through the ostomy site to the cecum without difficulty      A diminutive polyp was  found in the sigmoid colon. The polyp was       sessile. The polyp was removed with a jumbo cold forceps. Resection and       retrieval were complete.      Ostomy area colonic mucosa showed very  tiny bumps, possible grannulation       tissue possible polyp tisssue. Biopsies done of these tiny bumps      colon was otherwise normal. Impression:           - One diminutive polyp in the sigmoid colon, removed                        with a jumbo cold forceps. Resected and retrieved. Recommendation:       - Await pathology results. Manya Silvas, MD 12/14/2015 2:31:00 PM This report has been signed electronically. Number of Addenda: 0 Note Initiated On: 12/14/2015 1:44 PM Scope Withdrawal Time: 0 hours 29 minutes 10 seconds  Total Procedure Duration: 0 hours 33 minutes 40 seconds       [facility]

## 2015-12-14 NOTE — Anesthesia Preprocedure Evaluation (Addendum)
Anesthesia Evaluation  Patient identified by MRN, date of birth, ID band Patient awake    Reviewed: Allergy & Precautions, H&P , NPO status , Patient's Chart, lab work & pertinent test results  History of Anesthesia Complications Negative for: history of anesthetic complications  Airway Mallampati: III  TM Distance: >3 FB Neck ROM: full    Dental  (+) Poor Dentition   Pulmonary neg shortness of breath, asthma , sleep apnea ,    Pulmonary exam normal breath sounds clear to auscultation       Cardiovascular Exercise Tolerance: Good (-) angina(-) Past MI and (-) DOE negative cardio ROS Normal cardiovascular exam Rhythm:regular Rate:Normal     Neuro/Psych negative neurological ROS  negative psych ROS   GI/Hepatic negative GI ROS, Neg liver ROS, neg GERD  ,  Endo/Other  Morbid obesity  Renal/GU negative Renal ROS  negative genitourinary   Musculoskeletal   Abdominal   Peds  Hematology negative hematology ROS (+)   Anesthesia Other Findings Past Medical History:   Allergy                                                      Sleep apnea                                     2005           Comment:resolved with ENT surgery,  [name]   Hypersomnolence disorder                        2005           Comment:managed with metidate   Rectal cancer ([place])                             05/13/2012      Comment:Resection with colostomy and hysterectomy.  Past Surgical History:   ARTHROSCOPIC REPAIR ACL                          2006           Comment:right knee Dr Donna Christen at [place]                                                BMI    Body Mass Index   41.58 kg/m 2      Reproductive/Obstetrics negative OB ROS                            Anesthesia Physical Anesthesia Plan  ASA: III  Anesthesia Plan: General   Post-op  Pain Management:    Induction:   Airway Management Planned:   Additional Equipment:   Intra-op Plan:   Post-operative Plan:  Informed Consent: I have reviewed the patients History and Physical, chart, labs and discussed the procedure including the risks, benefits and alternatives for the proposed anesthesia with the patient or authorized representative who has indicated his/her understanding and acceptance.   Dental Advisory Given  Plan Discussed with: Anesthesiologist, CRNA and Surgeon  Anesthesia Plan Comments:         Anesthesia Quick Evaluation

## 2015-12-14 NOTE — Anesthesia Postprocedure Evaluation (Signed)
Anesthesia Post Note  Patient: Rachael Cowan  Procedure(s) Performed: Procedure(s) (LRB): COLONOSCOPY WITH PROPOFOL (N/A)  Patient location during evaluation: Endoscopy Anesthesia Type: General Level of consciousness: awake and alert Pain management: pain level controlled Vital Signs Assessment: post-procedure vital signs reviewed and stable Respiratory status: spontaneous breathing, nonlabored ventilation, respiratory function stable and patient connected to nasal cannula oxygen Cardiovascular status: blood pressure returned to baseline and stable Postop Assessment: no signs of nausea or vomiting Anesthetic complications: no    Last Vitals:  Filed Vitals:   12/14/15 1450 12/14/15 1500  BP: 104/65 104/73  Pulse: 62 74  Temp:    Resp: 17 17    Last Pain: There were no vitals filed for this visit.               Precious Haws Kye Silverstein

## 2015-12-14 NOTE — Transfer of Care (Signed)
Immediate Anesthesia Transfer of Care Note  Patient: Rachael Cowan  Procedure(s) Performed: Procedure(s): COLONOSCOPY WITH PROPOFOL (N/A)  Patient Location: PACU and Endoscopy Unit  Anesthesia Type:General  Level of Consciousness: awake, alert  and oriented  Airway & Oxygen Therapy: Patient Spontanous Breathing and Patient connected to nasal cannula oxygen  Post-op Assessment: Report given to RN and Post -op Vital signs reviewed and stable  Post vital signs: Reviewed and stable  Last Vitals:  Filed Vitals:   12/14/15 1426 12/14/15 1430  BP:  105/61  Pulse:  91  Temp: 36.8 C 36.2 C  Resp:  16    Complications: No apparent anesthesia complications

## 2015-12-17 ENCOUNTER — Ambulatory Visit: Payer: BLUE CROSS/BLUE SHIELD | Admitting: Oncology

## 2015-12-19 ENCOUNTER — Encounter: Payer: Self-pay | Admitting: Unknown Physician Specialty

## 2015-12-19 LAB — SURGICAL PATHOLOGY

## 2015-12-20 ENCOUNTER — Telehealth: Payer: Self-pay | Admitting: *Deleted

## 2015-12-20 ENCOUNTER — Inpatient Hospital Stay: Payer: BLUE CROSS/BLUE SHIELD | Attending: Oncology | Admitting: Oncology

## 2015-12-20 NOTE — Telephone Encounter (Signed)
Entered in error

## 2015-12-31 DIAGNOSIS — Z933 Colostomy status: Secondary | ICD-10-CM | POA: Diagnosis not present

## 2016-01-17 DIAGNOSIS — F419 Anxiety disorder, unspecified: Secondary | ICD-10-CM | POA: Diagnosis not present

## 2016-01-17 DIAGNOSIS — Z85048 Personal history of other malignant neoplasm of rectum, rectosigmoid junction, and anus: Secondary | ICD-10-CM | POA: Diagnosis not present

## 2016-01-17 DIAGNOSIS — J4520 Mild intermittent asthma, uncomplicated: Secondary | ICD-10-CM | POA: Diagnosis not present

## 2016-02-05 DIAGNOSIS — F321 Major depressive disorder, single episode, moderate: Secondary | ICD-10-CM | POA: Diagnosis not present

## 2016-02-11 DIAGNOSIS — Z933 Colostomy status: Secondary | ICD-10-CM | POA: Diagnosis not present

## 2016-02-13 DIAGNOSIS — F321 Major depressive disorder, single episode, moderate: Secondary | ICD-10-CM | POA: Diagnosis not present

## 2016-02-26 ENCOUNTER — Other Ambulatory Visit: Payer: Self-pay | Admitting: Family Medicine

## 2016-02-27 ENCOUNTER — Ambulatory Visit: Payer: BLUE CROSS/BLUE SHIELD | Admitting: Oncology

## 2016-02-27 ENCOUNTER — Inpatient Hospital Stay: Payer: BLUE CROSS/BLUE SHIELD | Attending: Family Medicine | Admitting: Family Medicine

## 2016-02-27 DIAGNOSIS — F321 Major depressive disorder, single episode, moderate: Secondary | ICD-10-CM | POA: Diagnosis not present

## 2016-03-04 DIAGNOSIS — F321 Major depressive disorder, single episode, moderate: Secondary | ICD-10-CM | POA: Diagnosis not present

## 2016-03-11 DIAGNOSIS — Z933 Colostomy status: Secondary | ICD-10-CM | POA: Diagnosis not present

## 2016-03-12 DIAGNOSIS — F321 Major depressive disorder, single episode, moderate: Secondary | ICD-10-CM | POA: Diagnosis not present

## 2016-03-18 DIAGNOSIS — F321 Major depressive disorder, single episode, moderate: Secondary | ICD-10-CM | POA: Diagnosis not present

## 2016-03-26 ENCOUNTER — Inpatient Hospital Stay: Payer: BLUE CROSS/BLUE SHIELD | Admitting: Family Medicine

## 2016-03-26 DIAGNOSIS — F321 Major depressive disorder, single episode, moderate: Secondary | ICD-10-CM | POA: Diagnosis not present

## 2016-04-08 DIAGNOSIS — F321 Major depressive disorder, single episode, moderate: Secondary | ICD-10-CM | POA: Diagnosis not present

## 2016-04-11 ENCOUNTER — Inpatient Hospital Stay: Payer: BLUE CROSS/BLUE SHIELD | Admitting: Family Medicine

## 2016-04-25 ENCOUNTER — Inpatient Hospital Stay: Payer: BLUE CROSS/BLUE SHIELD

## 2016-04-25 ENCOUNTER — Inpatient Hospital Stay: Payer: BLUE CROSS/BLUE SHIELD | Attending: Hematology and Oncology | Admitting: Hematology and Oncology

## 2016-04-25 VITALS — BP 119/78 | HR 91 | Temp 98.9°F | Resp 18 | Wt 228.4 lb

## 2016-04-25 DIAGNOSIS — K435 Parastomal hernia without obstruction or  gangrene: Secondary | ICD-10-CM

## 2016-04-25 DIAGNOSIS — Z9049 Acquired absence of other specified parts of digestive tract: Secondary | ICD-10-CM

## 2016-04-25 DIAGNOSIS — D123 Benign neoplasm of transverse colon: Secondary | ICD-10-CM | POA: Diagnosis not present

## 2016-04-25 DIAGNOSIS — C20 Malignant neoplasm of rectum: Secondary | ICD-10-CM

## 2016-04-25 DIAGNOSIS — Z933 Colostomy status: Secondary | ICD-10-CM | POA: Diagnosis not present

## 2016-04-25 DIAGNOSIS — Z90722 Acquired absence of ovaries, bilateral: Secondary | ICD-10-CM | POA: Diagnosis not present

## 2016-04-25 DIAGNOSIS — Z90710 Acquired absence of both cervix and uterus: Secondary | ICD-10-CM

## 2016-04-25 DIAGNOSIS — Z9221 Personal history of antineoplastic chemotherapy: Secondary | ICD-10-CM

## 2016-04-25 DIAGNOSIS — Z923 Personal history of irradiation: Secondary | ICD-10-CM

## 2016-04-25 DIAGNOSIS — Z79899 Other long term (current) drug therapy: Secondary | ICD-10-CM | POA: Diagnosis not present

## 2016-04-25 DIAGNOSIS — Z85048 Personal history of other malignant neoplasm of rectum, rectosigmoid junction, and anus: Secondary | ICD-10-CM | POA: Diagnosis not present

## 2016-04-25 DIAGNOSIS — Z95828 Presence of other vascular implants and grafts: Secondary | ICD-10-CM

## 2016-04-25 LAB — COMPREHENSIVE METABOLIC PANEL
ALT: 49 U/L (ref 14–54)
AST: 36 U/L (ref 15–41)
Albumin: 4 g/dL (ref 3.5–5.0)
Alkaline Phosphatase: 78 U/L (ref 38–126)
Anion gap: 9 (ref 5–15)
BUN: 14 mg/dL (ref 6–20)
CO2: 22 mmol/L (ref 22–32)
Calcium: 9.3 mg/dL (ref 8.9–10.3)
Chloride: 105 mmol/L (ref 101–111)
Creatinine, Ser: 0.68 mg/dL (ref 0.44–1.00)
GFR calc Af Amer: >60 mL/min (ref >60)
GFR calc non Af Amer: >60 mL/min (ref >60)
Glucose, Bld: 117 mg/dL — ABNORMAL HIGH (ref 65–99)
Potassium: 3.8 mmol/L (ref 3.5–5.1)
Sodium: 136 mmol/L (ref 135–145)
Total Bilirubin: 0.5 mg/dL (ref 0.3–1.2)
Total Protein: 7.2 g/dL (ref 6.5–8.1)

## 2016-04-25 LAB — CBC WITH DIFFERENTIAL/PLATELET
Basophils Absolute: 0 10*3/uL (ref 0–0.1)
Basophils Relative: 1 %
Eosinophils Absolute: 0.1 10*3/uL (ref 0–0.7)
Eosinophils Relative: 2 %
HCT: 37.7 % (ref 35.0–47.0)
Hemoglobin: 13 g/dL (ref 12.0–16.0)
Lymphocytes Relative: 36 %
Lymphs Abs: 2.1 10*3/uL (ref 1.0–3.6)
MCH: 30.6 pg (ref 26.0–34.0)
MCHC: 34.5 g/dL (ref 32.0–36.0)
MCV: 88.5 fL (ref 80.0–100.0)
Monocytes Absolute: 0.3 10*3/uL (ref 0.2–0.9)
Monocytes Relative: 5 %
Neutro Abs: 3.2 10*3/uL (ref 1.4–6.5)
Neutrophils Relative %: 56 %
Platelets: 209 10*3/uL (ref 150–440)
RBC: 4.26 MIL/uL (ref 3.80–5.20)
RDW: 14.5 % (ref 11.5–14.5)
WBC: 5.8 10*3/uL (ref 3.6–11.0)

## 2016-04-25 MED ORDER — SODIUM CHLORIDE 0.9% FLUSH
10.0000 mL | INTRAVENOUS | Status: DC | PRN
  Administered 2016-04-25: 10 mL via INTRAVENOUS
  Filled 2016-04-25: qty 10

## 2016-04-25 MED ORDER — HEPARIN SOD (PORK) LOCK FLUSH 100 UNIT/ML IV SOLN
500.0000 [IU] | Freq: Once | INTRAVENOUS | Status: AC
  Administered 2016-04-25: 500 [IU] via INTRAVENOUS

## 2016-04-25 NOTE — Progress Notes (Signed)
[facility] day:  04/25/16  Chief Complaint: Rachael Cowan is a 30 y.o. female with a history of stage IIIB rectal cancer who is seen for reassessment.  HPI:  The patient presented with rectal bleeding.  Colonoscopy on 12/31/2011 revealed a significant stricture of the distal rectum. Malignant appearing ulcerated tumor was found. There was scattered area extending from about 10 cm the anal area involving anus itself.  Tumor was T4N1M0 (based on endoscopy [facility] ultrasound).  MRI scan of pelvis revealed invasion of the vaginal wall.  PET scan on 01/14/2012 revealed PET positive area within the rectum and distal sigmoid colon.   She was treated with 6 weeks of continuous infusion (Monday-Friday) 5-fluorouracil (5-FU) with radiation.  Radiation and chemotherapy completed on 03/07/2012.  She underwent rectosigmoidectomy, hysterectomy and oophorectomy on 05/13/2012 at [facility].  Pathologic stage was ypT2ypN0.  She began post-operative FOLFOX chemotherapy in 05/2012.  Oxaliplatin was held in 06/2012 due to elevated liver enzymes and restarted in 07/2012.  She finished FOLFOX chemotherapy in 12/2012 (11cycles). Because of neutropenia, her last treatment was discontinued.  Abdomen and pelvic CT on 10/16/2015 noted the resection for distal rectal adenocarcinoma. There was stable appearance of the lower anatomic pelvis with no new or progressive soft tissue to suggest local recurrence.  There was parastomal hernia containing an increased amount of pericolonic fat compared to the previous study.  Colonoscopy performed by Dr. Gaylyn Cheers on 12/14/2015 revealed one diminutive polyp in the sigmoid colon (removed).  Pathology revealed a tubular adenoma with no evidence of dysplasia or malignancy.  Nodules at the top of the colostomy revealed inflamed and ulcerated colocutaneous tissue.  She has no family history of colon cancer.  My Risk genetic testing was negative per  patient report.   Past Medical History:  Diagnosis Date  . Allergy   . Hypersomnolence disorder 2005   managed with metidate  . Rectal cancer ([place]) 05/13/2012   Resection with colostomy and hysterectomy.  . Sleep apnea 2005   resolved with ENT surgery,  [name]    Past Surgical History:  Procedure Laterality Date  . ABDOMINAL HYSTERECTOMY    . ARTHROSCOPIC REPAIR ACL  2006   right knee Dr Donna Christen at [facility]  . COLON SURGERY    . COLONOSCOPY WITH PROPOFOL N/A 12/14/2015   Procedure: COLONOSCOPY WITH PROPOFOL;  Surgeon: Manya Silvas, MD;  Location: [facility] ENDOSCOPY;  Service: Endoscopy;  Laterality: N/A;    Family History  Problem Relation Age of Onset  . Hyperlipidemia Neg Hx     Social History:  reports that she has never smoked. She has never used smokeless tobacco. She reports that she drinks about 4.2 oz of alcohol per week . She reports that she does not use drugs.  She lives in [place], [place], but plans on moving back to [place] where her mother and rest of her family lives.  She graduated in 08/2015.  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  She has 2 siblings.  The patient is alone today.  Allergies: No Known Allergies  Current Medications: Current Outpatient Prescriptions  Medication Sig Dispense Refill  . Cyanocobalamin (RA VITAMIN B-12 TR) 1000 MCG TBCR Take by mouth.    . estrogens, conjugated, (PREMARIN) 0.9 MG tablet Take 1 tablet (0.9 mg total) by mouth daily. 30 tablet 11  . loratadine (CLARITIN) 10 MG tablet Take 10 mg by mouth daily.    . methylphenidate ([place] CD)  20 MG CR capsule Take 20 mg by mouth 2 (two) times daily.    . montelukast (SINGULAIR) 10 MG tablet TAKE 1 TABLET BY ORAL ROUTE EVERY DAY IN THE EVENING  6  . Omega-3 Fatty Acids (FISH OIL PO) Take by mouth.    . pantoprazole (PROTONIX) 40 MG tablet Take by mouth.     No current facility-administered medications for this visit.     Review of Systems:  GENERAL:   Feels "good".  Active.  No fevers or sweats.  Weight gain. PERFORMANCE STATUS (ECOG):  1 HEENT:  No visual changes, runny nose, sore throat, mouth sores or tenderness. Lungs: No shortness of breath or cough.  No hemoptysis. Cardiac:  No chest pain, palpitations, orthopnea, or PND. GI:  No nausea, vomiting, diarrhea, constipation, melena or hematochezia. GU:  No urgency, frequency, dysuria, or hematuria.  s/p TAH/BSO on Premarin. Musculoskeletal:  No back pain.  No joint pain.  No muscle tenderness. Extremities:  No pain or swelling. Skin:  No rashes or skin changes. Neuro:  No headache, numbness or weakness, balance or coordination issues. Endocrine:  No diabetes, thyroid issues, hot flashes or night sweats. Psych:  No mood changes, depression or anxiety.  Working on therapy. Pain:  No focal pain. Review of systems:  All other systems reviewed and found to be negative.  Physical Exam: Blood pressure 119/78, pulse 91, temperature 98.9 F (37.2 C), temperature source Tympanic, resp. rate 18, weight 228 lb 6.3 oz (103.6 kg), last menstrual period 10/25/2011. GENERAL:  Well developed, well nourished, heavyset woman sitting comfortably in the exam room in no acute distress. MENTAL STATUS:  Alert and oriented to person, place and time. HEAD:  Long brown hair.  Normocephalic, atraumatic, face symmetric, no Cushingoid features. EYES:  Blue eyes.  Pupils equal round and reactive to light and accomodation.  No conjunctivitis or scleral icterus. ENT:  Oropharynx clear without lesion.  Tongue normal. Mucous membranes moist.  RESPIRATORY:  Clear to auscultation without rales, wheezes or rhonchi. CARDIOVASCULAR:  Regular rate and rhythm without murmur, rub or gallop. ABDOMEN:  Colostomy.  Soft, non-tender, with active bowel sounds, and no appreciable hepatosplenomegaly.  No masses. SKIN:  Tan.  No rashes, ulcers or lesions. EXTREMITIES: No edema, no skin discoloration or tenderness.  No palpable  cords. LYMPH NODES: No palpable cervical, supraclavicular, axillary or inguinal adenopathy  NEUROLOGICAL: Unremarkable. PSYCH:  Appropriate.   No visits with results within 3 Day(s) from this visit.  Latest known visit with results is:  Admission on 12/14/2015, Discharged on 12/14/2015  Component Date Value Ref Range Status  . SURGICAL PATHOLOGY 12/19/2015    Final                   Value:Surgical Pathology CASE: [id number] PATIENT: Rachael Cowan Surgical Pathology Report     SPECIMEN SUBMITTED: A. Colon polyp, sigmoid, cold bx B. Nodules at top of colostomy, cold bx  CLINICAL HISTORY: None provided  PRE-OPERATIVE DIAGNOSIS: P HX rectal CA  POST-OPERATIVE DIAGNOSIS: Colon polyp, normal post-operative anatomy, nodules at top of colonoscopy.     DIAGNOSIS: A. COLON POLYP, SIGMOID; COLD BIOPSY: - TUBULAR ADENOMA, FRAGMENTS. - NEGATIVE FOR HIGH-GRADE DYSPLASIA AND MALIGNANCY.  B. NODULES AT TOP OF COLOSTOMY; COLD BIOPSY: -  INFLAMED AND ULCERATED COLOCUTANEOUS TISSUE. - POLYPOID INFLAMED GRANULATION TISSUE. - MULTIPLE DEEPER SECTIONS EXAMINED. - NEGATIVE FOR DYSPLASIA AND MALIGNANCY.   GROSS DESCRIPTION: A. Labeled: Cold biopsy of sigmoid colon polyp  Tissue fragment(s): Multiple  Size: 0.8 x  0.3 x 0.2 cm  Description: Aggregate of tan tissue fragments  Entirely submitted in one cassette(s).   B. Labeled: Cold biopsies of no                         dules at top of colostomy  Tissue fragment(s): Multiple  Size: 0.8 x 0.3 x 0.2 cm  Description: Aggregate of tan tissue fragments  Entirely submitted in one cassette(s).     Final Diagnosis performed by Delorse Lek, MD.  Electronically signed 12/19/2015 [id number]    The electronic signature indicates that the named Attending Pathologist has evaluated the specimen  Technical component performed at [facility], [address] [place], [place] [address] Lab: [phone] Dir: Darrick Penna. Evette Doffing,  MD  Professional component performed at [facility], [facility], [place], [place], [place] [address] Lab: [phone] Dir: Dellia Nims. Rubinas, MD      Assessment:  Rachael Cowan is a 30 y.o. female with a history of stage IIIB (T4N1M0) rectal cancer s/p neoadjuvant radiation and 5FU (completed on 03/07/2012) followed by rectosigmoidectomy, hysterectomy and oophorectomy on 05/13/2012 at [facility].  Pathologic stage was ypT2ypN0.  She began post-operative FOLFOX chemotherapy in 05/2012.  She received 11 cycles (completed in 12/2012) secondary to neutropenia.  Abdomen and pelvic CT on 10/16/2015 noted the resection for distal rectal adenocarcinoma. There was stable appearance of the lower anatomic pelvis with no new or progressive soft tissue to suggest local recurrence.  There was a parastomal hernia containing an increased amount of pericolonic fat compared to the previous study.  Colonoscopy performed on 12/14/2015 revealed one diminutive polyp in the sigmoid colon.  Pathology revealed a tubular adenoma with no evidence of dysplasia or malignancy.  Nodules at the top of the colostomy revealed inflamed and ulcerated colocutaneous tissue.  She has no family history of colon cancer.  My Risk genetic testing was negative per patient report.  Symptomatically, she is doing well.  Exam is unremarkable.  Plan: 1.  Review diagnosis, staging, and management of rectal cancer.  Discuss follow-up endoscopy (next per patient in 2 years).  Discuss yearly imaging.  Discuss follow-up clinic visits every 6 months and labs (including CEA). 2.  Discuss patient's concerns about gynecologic issues and Premarin.  Discuss follow-up with gynecology/oncology. 3.  Labs today:  CBC with diff, CMP, CEA. 4.  Follow-up with genetic counselor at [facility] regarding original testing and mammograms. 5.  Schedule abdominal and pelvic CT scan 10/15/2016 6.  Consult Dr. Theora Gianotti- GYN onc 7.  RTC in 6 months for MD  assessment, labs (CBC with diff, CMP, CEA) and review of interval CT.   Lequita Asal, MD  04/25/2016, 11:40 AM

## 2016-04-25 NOTE — Progress Notes (Signed)
Patient is here for follow up, she notes some pain with colostomy. Go over scan and get clarification on pre cancerous cell from colonoscopy that was recently done.

## 2016-04-26 LAB — CEA: CEA: 0.9 ng/mL (ref 0.0–4.7)

## 2016-04-27 ENCOUNTER — Encounter: Payer: Self-pay | Admitting: Hematology and Oncology

## 2016-04-28 ENCOUNTER — Other Ambulatory Visit: Payer: Self-pay | Admitting: *Deleted

## 2016-04-28 DIAGNOSIS — C20 Malignant neoplasm of rectum: Secondary | ICD-10-CM

## 2016-05-06 DIAGNOSIS — F321 Major depressive disorder, single episode, moderate: Secondary | ICD-10-CM | POA: Diagnosis not present

## 2016-05-14 ENCOUNTER — Inpatient Hospital Stay: Payer: BLUE CROSS/BLUE SHIELD

## 2016-05-15 DIAGNOSIS — Z933 Colostomy status: Secondary | ICD-10-CM | POA: Diagnosis not present

## 2016-05-21 DIAGNOSIS — F321 Major depressive disorder, single episode, moderate: Secondary | ICD-10-CM | POA: Diagnosis not present

## 2016-06-03 DIAGNOSIS — F321 Major depressive disorder, single episode, moderate: Secondary | ICD-10-CM | POA: Diagnosis not present

## 2016-06-11 ENCOUNTER — Other Ambulatory Visit: Payer: Self-pay

## 2016-06-11 ENCOUNTER — Inpatient Hospital Stay: Payer: BLUE CROSS/BLUE SHIELD | Attending: Obstetrics and Gynecology | Admitting: Obstetrics and Gynecology

## 2016-06-11 ENCOUNTER — Encounter: Payer: Self-pay | Admitting: Obstetrics and Gynecology

## 2016-06-11 VITALS — BP 138/82 | HR 97 | Temp 98.1°F | Ht 61.5 in | Wt 228.0 lb

## 2016-06-11 DIAGNOSIS — E669 Obesity, unspecified: Secondary | ICD-10-CM

## 2016-06-11 DIAGNOSIS — C20 Malignant neoplasm of rectum: Secondary | ICD-10-CM

## 2016-06-11 DIAGNOSIS — Z79890 Hormone replacement therapy (postmenopausal): Secondary | ICD-10-CM

## 2016-06-11 DIAGNOSIS — E785 Hyperlipidemia, unspecified: Secondary | ICD-10-CM

## 2016-06-11 DIAGNOSIS — Z90710 Acquired absence of both cervix and uterus: Secondary | ICD-10-CM

## 2016-06-11 DIAGNOSIS — Z85048 Personal history of other malignant neoplasm of rectum, rectosigmoid junction, and anus: Secondary | ICD-10-CM

## 2016-06-11 DIAGNOSIS — G4733 Obstructive sleep apnea (adult) (pediatric): Secondary | ICD-10-CM

## 2016-06-11 DIAGNOSIS — Z90722 Acquired absence of ovaries, bilateral: Secondary | ICD-10-CM

## 2016-06-11 DIAGNOSIS — Z9221 Personal history of antineoplastic chemotherapy: Secondary | ICD-10-CM

## 2016-06-11 DIAGNOSIS — N3946 Mixed incontinence: Secondary | ICD-10-CM

## 2016-06-11 DIAGNOSIS — Z780 Asymptomatic menopausal state: Secondary | ICD-10-CM | POA: Diagnosis not present

## 2016-06-11 DIAGNOSIS — R32 Unspecified urinary incontinence: Secondary | ICD-10-CM

## 2016-06-11 DIAGNOSIS — Z923 Personal history of irradiation: Secondary | ICD-10-CM

## 2016-06-11 DIAGNOSIS — Z6841 Body Mass Index (BMI) 40.0 and over, adult: Secondary | ICD-10-CM

## 2016-06-11 DIAGNOSIS — G4710 Hypersomnia, unspecified: Secondary | ICD-10-CM

## 2016-06-11 DIAGNOSIS — E538 Deficiency of other specified B group vitamins: Secondary | ICD-10-CM

## 2016-06-11 NOTE — Progress Notes (Addendum)
Gynecologic Oncology Consult Visit   Referring Provider: Dr. Lequita Asal Oncology   Chief Concern: ERT and gynecologic issues.   Subjective:  Rachael Cowan is a 30 y.o. female who is seen in consultation from Dr. Mike Gip for ERT and gynecologic issues.   Ms. Fellenz has a history of stage IIIB rectal cancer s/p concurrent chemotherapy and radiation followed by definitive surgery as per note below.   She originally presented with rectal bleeding.  Colonoscopy on 12/31/2011 revealed a significant stricture of the distal rectum. Malignant appearing ulcerated tumor was found. There was scattered area extending from about 10 cm the anal area involving anus itself.  Tumor was T4N1M0 (based on endoscopy [facility] ultrasound).  MRI scan of pelvis revealed invasion of the vaginal wall.  PET scan on 01/14/2012 revealed PET positive area within the rectum and distal sigmoid colon.   She was treated with 6 weeks of continuous infusion (Monday-Friday) 5-fluorouracil (5-FU) with radiation.  Radiation and chemotherapy completed on 03/07/2012.  She underwent rectosigmoidectomy, hysterectomy and oophorectomy on 05/13/2012 at [facility].  Dr. Maye Hides performed her surgery at [facility]. Pathologic stage was ypT2ypN0. MSI performed, limited sample, MSS.  She began post-operative FOLFOX chemotherapy in 05/2012.  Oxaliplatin was held in 06/2012 due to elevated liver enzymes and restarted in 07/2012.  She finished FOLFOX chemotherapy in 12/2012 (11cycles). Because of neutropenia, her last treatment was discontinued.  Abdomen and pelvic CT on 10/16/2015 noted the resection for distal rectal adenocarcinoma. There was stable appearance of the lower anatomic pelvis with no new or progressive soft tissue to suggest local recurrence.  There was parastomal hernia containing an increased amount of pericolonic fat compared to the previous study.  Colonoscopy performed by Dr. Gaylyn Cheers on 12/14/2015 revealed one diminutive polyp  in the sigmoid colon (removed).  Pathology revealed a tubular adenoma with no evidence of dysplasia or malignancy.  Nodules at the top of the colostomy revealed inflamed and ulcerated colocutaneous tissue.  She has no family history of colon cancer.  My Risk genetic testing was negative per patient report.  She has been followed by Dr. Mike Gip for her oncology issues. She was seen by Dr. Erasmo Leventhal for her postop visit in 2013 and given a prescription for oral estrogen for menopausal symptoms as well as vaginal estrogen and a dilator for vaginal stenosis. She has not been using the vaginal estrogen or dilator. She is using premarin 0.09 mg daily dose and has good control of her hot flashes. She asked about a screening mammogram.   Problem List: Patient Active Problem List   Diagnosis Date Noted  . B12 deficiency 06/23/2013  . Obesity, unspecified 06/23/2013  . Other and unspecified hyperlipidemia 06/23/2013  . Abdominal pain 06/01/2012  . Febrile 06/01/2012  . Encounter for change or removal of nonsurgical wound dressing 05/28/2012  . Acute post-operative pain 05/14/2012  . Menopause 05/13/2012  . Airway hyperreactivity 05/07/2012  . Hypersomnia 05/07/2012  . Obstructive apnea 05/07/2012  . Rectal cancer ([place]) 01/13/2012  . Primary hypersomnolence disorder 11/12/2011  . Sleep apnea     Past Medical History: Past Medical History:  Diagnosis Date  . Allergy   . Hypersomnolence disorder 2005   managed with metidate  . Rectal cancer ([place]) 05/13/2012   Resection with colostomy and hysterectomy.  . Sleep apnea 2005   resolved with ENT surgery,  [name]    Past Surgical History: Past Surgical History:  Procedure Laterality Date  . ABDOMINAL HYSTERECTOMY    . ARTHROSCOPIC REPAIR ACL  2006   right knee Dr Donna Christen at [facility]  . COLON SURGERY    . COLONOSCOPY WITH PROPOFOL N/A 12/14/2015   Procedure: COLONOSCOPY WITH PROPOFOL;  Surgeon: Manya Silvas, MD;  Location: [facility]  ENDOSCOPY;  Service: Endoscopy;  Laterality: N/A;    Past Gynecologic History: see HPI Menarche age 32 y.o.    OB History: G0P0 OB History  No data available    Family History: Family History  Problem Relation Age of Onset  . Hyperlipidemia Neg Hx     Social History: Social History   Social History  . Marital status: Single    Spouse name: N/A  . Number of children: N/A  . Years of education: N/A   Occupational History  . Not on file.   Social History Main Topics  . Smoking status: Never Smoker  . Smokeless tobacco: Never Used  . Alcohol use 4.2 oz/week    7 Glasses of wine per week  . Drug use: No  . Sexual activity: Not on file   Other Topics Concern  . Not on file   Social History Narrative  . No narrative on file    Allergies: No Known Allergies  Current Medications: Current Outpatient Prescriptions  Medication Sig Dispense Refill  . Cyanocobalamin (RA VITAMIN B-12 TR) 1000 MCG TBCR Take by mouth.    . estrogens, conjugated, (PREMARIN) 0.9 MG tablet Take 1 tablet (0.9 mg total) by mouth daily. 30 tablet 11  . loratadine (CLARITIN) 10 MG tablet Take 10 mg by mouth daily.    . methylphenidate ([place] CD) 30 MG CR capsule TAKE 1 CAPSULE BY ORAL ROUTE EVERY DAY IN THE MORNING AND IN THE AFTERNOON  0  . montelukast (SINGULAIR) 10 MG tablet TAKE 1 TABLET BY ORAL ROUTE EVERY DAY IN THE EVENING  6  . Omega-3 Fatty Acids (FISH OIL PO) Take by mouth.    . pantoprazole (PROTONIX) 40 MG tablet Take by mouth.     No current facility-administered medications for this visit.     Review of Systems General: weight gain  HEENT: no complaints  Lungs: no complaints  Cardiac: no complaints  GI: no complaints  GU: urinary incontinence. Not sexually active  Musculoskeletal: no complaints  Extremities: no complaints  Skin: no complaints  Neuro: no complaints  Endocrine: no complaints  Psych: no complaints       Objective:  Physical Examination:  BP  138/82 (BP Location: Right Arm, Patient Position: Sitting)   Pulse 97   Temp 98.1 F (36.7 C) (Oral)   Ht 5' 1.5" (1.562 m)   Wt 227 lb 15.3 oz (103.4 kg)   LMP 10/25/2011   BMI 42.37 kg/m    ECOG Performance Status: 0 - Asymptomatic  General appearance: alert, cooperative and appears stated age HEENT:extra ocular movement intact and sclera clear, anicteric Lymph node survey: non-palpable, axillary, inguinal, supraclavicular Cardiovascular: regular rate and rhythm Respiratory: normal air entry, lungs clear to auscultation Breast exam: breasts appear normal, no suspicious masses, no skin or nipple changes or axillary nodes. Abdomen: soft, non-tender, without masses or organomegaly, no hernias and well healed incision, ostomy intact. Back: inspection of back is normal Extremities: extremities normal, atraumatic, no cyanosis or edema Skin exam - normal coloration and turgor, no rashes, no suspicious skin lesions noted. Neurological exam reveals alert, oriented, normal speech, no focal findings or movement disorder noted.  Pelvic: exam chaperoned by nurse;  Vulva: normal appearing vulva with no masses, tenderness or lesions; Vagina: deviated posteriorly, length  and width preserved. Kegels weak tone. Cystocele present.  BME.  Adnexa: no masses surgically absent bilateral; Uterus: surgically absent, vaginal cuff well healed; Cervix: absent; Rectal:surgically absent      Assessment:  BREI POCIASK is a 30 y.o. female diagnosed with h/o stage IIIB rectal cancer s/p concurrent chemotherapy and radiation followed by definitive surgery XL rectosigmoidectomy with APR, hysterectomy and oophorectomy on 05/13/2012. Clinically NED. Menopause managed with ERT. Abnormal anatomy. Urinary incontinence with cystocele.   Medical co-morbidities complicating care: chemotherapy and radiation. Body mass index is 42.37 kg/m.  Plan:   Problem List Items Addressed This Visit      Other   Menopause -  Primary    Other Visit Diagnoses    Mixed incontinence          We discussed options for management of menopause, urinary incontinence, and obesity.   With regard to menopause we recommended continued oral ERT with weaning of therapy at age 59. We also recommended continued use of vaginal dilators to prevent stenosis. She can use K-Y jelly for lubrication. I don't think she need vaginal ERT as the vaginal vault looks well estrogenized.   I recommend Urogynecology referral for urinary incontinence.   We also discussed her weight and need for weight loss, stressed good nutrition, and exercise.   I did perform a breast exam today that was negative. I do not see the need for screening mammogram as her genetic testing was negative. I asked her to follow up with Dr. Derrel Nip for her breast cancer screening needs.   A total of 40 minutes were spent with the patient/family today; of the exam as well as education, counseling and coordination of care.   Gillis Ends, MD    CC: Dr. Lequita Asal

## 2016-06-11 NOTE — Progress Notes (Signed)
  Oncology Nurse Navigator Documentation Referral entered and faxed to Dr Blima Rich Urogyn at [facility]

## 2016-06-11 NOTE — Progress Notes (Signed)
  Oncology Nurse Navigator Documentation Chaperoned pelvic exam. Referral to [facility]. Follow up in gyn clinic in one year. Ask to notify me if she cannot locate her dilator. Navigator Location: [facility] (06/11/16 1000) Navigator Encounter Type: Clinic/MDC (06/11/16 1000)               Barriers/Navigation Needs: Coordination of Care (06/11/16 1000)                Acuity: Level 2 (06/11/16 1000)   Acuity Level 2: Initial guidance, education and coordination as needed;Educational needs;Assistance expediting appointments;Ongoing guidance and education throughout treatment as needed (06/11/16 1000)     Time Spent with Patient: 30 (06/11/16 1000)

## 2016-06-11 NOTE — Progress Notes (Signed)
Patient here for initial. Patient is concerned about her hormone replacement therapy and related wt. Gain.

## 2016-06-11 NOTE — Patient Instructions (Signed)
Look for the lose it app and search on internet for LoseIt    Exercising to Lose Weight Exercising can help you to lose weight. In order to lose weight through exercise, you need to do vigorous-intensity exercise. You can tell that you are exercising with vigorous intensity if you are breathing very hard and fast and cannot hold a conversation while exercising. Moderate-intensity exercise helps to maintain your current weight. You can tell that you are exercising at a moderate level if you have a higher heart rate and faster breathing, but you are still able to hold a conversation. HOW OFTEN SHOULD I EXERCISE? Choose an activity that you enjoy and set realistic goals. Your health care provider can help you to make an activity plan that works for you. Exercise regularly as directed by your health care provider. This may include:  Doing resistance training twice each week, such as:  Push-ups.  Sit-ups.  Lifting weights.  Using resistance bands.  Doing a given intensity of exercise for a given amount of time. Choose from these options:  150 minutes of moderate-intensity exercise every week.  75 minutes of vigorous-intensity exercise every week.  A mix of moderate-intensity and vigorous-intensity exercise every week. Children, pregnant women, people who are out of shape, people who are overweight, and older adults may need to consult a health care provider for individual recommendations. If you have any sort of medical condition, be sure to consult your health care provider before starting a new exercise program. WHAT ARE SOME ACTIVITIES THAT CAN HELP ME TO LOSE WEIGHT?   Walking at a rate of at least 4.5 miles an hour.  Jogging or running at a rate of 5 miles per hour.  Biking at a rate of at least 10 miles per hour.  Lap swimming.  Roller-skating or in-line skating.  Cross-country skiing.  Vigorous competitive sports, such as football, basketball, and soccer.  Jumping  rope.  Aerobic dancing. HOW CAN I BE MORE ACTIVE IN MY DAY-TO-DAY ACTIVITIES?  Use the stairs instead of the elevator.  Take a walk during your lunch break.  If you drive, park your car farther away from work or school.  If you take public transportation, get off one stop early and walk the rest of the way.  Make all of your phone calls while standing up and walking around.  Get up, stretch, and walk around every 30 minutes throughout the day. WHAT GUIDELINES SHOULD I FOLLOW WHILE EXERCISING?  Do not exercise so much that you hurt yourself, feel dizzy, or get very short of breath.  Consult your health care provider prior to starting a new exercise program.  Wear comfortable clothes and shoes with good support.  Drink plenty of water while you exercise to prevent dehydration or heat stroke. Body water is lost during exercise and must be replaced.  Work out until you breathe faster and your heart beats faster.   This information is not intended to replace advice given to you by your health care provider. Make sure you discuss any questions you have with your health care provider.   Document Released: 10/18/2010 Document Revised: 10/06/2014 Document Reviewed: 02/16/2014 Elsevier Interactive Patient Education 2016 [employer]. Calorie Counting for [employer] Loss Calories are energy you get from the things you eat and drink. Your body uses this energy to keep you going throughout the day. The number of calories you eat affects your weight. When you eat more calories than your body needs, your body stores the extra  calories as fat. When you eat fewer calories than your body needs, your body burns fat to get the energy it needs. Calorie counting means keeping track of how many calories you eat and drink each day. If you make sure to eat fewer calories than your body needs, you should lose weight. In order for calorie counting to work, you will need to eat the number of calories that are  right for you in a day to lose a healthy amount of weight per week. A healthy amount of weight to lose per week is usually 1-2 lb (0.5-0.9 kg). A dietitian can determine how many calories you need in a day and give you suggestions on how to reach your calorie goal.   WHAT DO I NEED TO KNOW ABOUT CALORIE COUNTING? In order to meet your daily calorie goal, you will need to:  Find out how many calories are in each food you would like to eat. Try to do this before you eat.  Decide how much of the food you can eat.  Write down what you ate and how many calories it had. Doing this is called keeping a food log. WHERE DO I FIND CALORIE INFORMATION? The number of calories in a food can be found on a Nutrition Facts label. Note that all the information on a label is based on a specific serving of the food. If a food does not have a Nutrition Facts label, try to look up the calories online or ask your dietitian for help. HOW DO I DECIDE HOW MUCH TO EAT? To decide how much of the food you can eat, you will need to consider both the number of calories in one serving and the size of one serving. This information can be found on the Nutrition Facts label. If a food does not have a Nutrition Facts label, look up the information online or ask your dietitian for help. Remember that calories are listed per serving. If you choose to have more than one serving of a food, you will have to multiply the calories per serving by the amount of servings you plan to eat. For example, the label on a package of bread might say that a serving size is 1 slice and that there are 90 calories in a serving. If you eat 1 slice, you will have eaten 90 calories. If you eat 2 slices, you will have eaten 180 calories. HOW DO I KEEP A FOOD LOG? After each meal, record the following information in your food log:  What you ate.  How much of it you ate.  How many calories it had.  Then, add up your calories. Keep your food log near you,  such as in a small notebook in your pocket. Another option is to use a mobile app or website. Some programs will calculate calories for you and show you how many calories you have left each time you add an item to the log. WHAT ARE SOME CALORIE COUNTING TIPS?  Use your calories on foods and drinks that will fill you up and not leave you hungry. Some examples of this include foods like nuts and nut butters, vegetables, lean proteins, and high-fiber foods (more than 5 g fiber per serving).  Eat nutritious foods and avoid empty calories. Empty calories are calories you get from foods or beverages that do not have many nutrients, such as candy and soda. It is better to have a nutritious high-calorie food (such as an avocado) than a food  with few nutrients (such as a bag of chips).  Know how many calories are in the foods you eat most often. This way, you do not have to look up how many calories they have each time you eat them.  Look out for foods that may seem like low-calorie foods but are really high-calorie foods, such as baked goods, soda, and fat-free candy.  Pay attention to calories in drinks. Drinks such as sodas, specialty coffee drinks, alcohol, and juices have a lot of calories yet do not fill you up. Choose low-calorie drinks like water and diet drinks.  Focus your calorie counting efforts on higher calorie items. Logging the calories in a garden salad that contains only vegetables is less important than calculating the calories in a milk shake.  Find a way of tracking calories that works for you. Get creative. Most people who are successful find ways to keep track of how much they eat in a day, even if they do not count every calorie. WHAT ARE SOME PORTION CONTROL TIPS?  Know how many calories are in a serving. This will help you know how many servings of a certain food you can have.  Use a measuring cup to measure serving sizes. This is helpful when you start out. With time, you will  be able to estimate serving sizes for some foods.  Take some time to put servings of different foods on your favorite plates, bowls, and cups so you know what a serving looks like.  Try not to eat straight from a bag or box. Doing this can lead to overeating. Put the amount you would like to eat in a cup or on a plate to make sure you are eating the right portion.  Use smaller plates, glasses, and bowls to prevent overeating. This is a quick and easy way to practice portion control. If your plate is smaller, less food can fit on it.  Try not to multitask while eating, such as watching TV or using your computer. If it is time to eat, sit down at a table and enjoy your food. Doing this will help you to start recognizing when you are full. It will also make you more aware of what and how much you are eating. HOW CAN I CALORIE COUNT WHEN EATING OUT?  Ask for smaller portion sizes or child-sized portions.  Consider sharing an entree and sides instead of getting your own entree.  If you get your own entree, eat only half. Ask for a box at the beginning of your meal and put the rest of your entree in it so you are not tempted to eat it.  Look for the calories on the menu. If calories are listed, choose the lower calorie options.  Choose dishes that include vegetables, fruits, whole grains, low-fat dairy products, and lean protein. Focusing on smart food choices from each of the 5 food groups can help you stay on track at restaurants.  Choose items that are boiled, broiled, grilled, or steamed.  Choose water, milk, unsweetened iced tea, or other drinks without added sugars. If you want an alcoholic beverage, choose a lower calorie option. For example, a regular margarita can have up to 700 calories and a glass of wine has around 150.  Stay away from items that are buttered, battered, fried, or served with cream sauce. Items labeled "crispy" are usually fried, unless stated otherwise.  Ask for  dressings, sauces, and syrups on the side. These are usually very high in  calories, so do not eat much of them.  Watch out for salads. Many people think salads are a healthy option, but this is often not the case. Many salads come with bacon, fried chicken, lots of cheese, fried chips, and dressing. All of these items have a lot of calories. If you want a salad, choose a garden salad and ask for grilled meats or steak. Ask for the dressing on the side, or ask for olive oil and vinegar or lemon to use as dressing.  Estimate how many servings of a food you are given. For example, a serving of cooked rice is  cup or about the size of half a tennis ball or one cupcake wrapper. Knowing serving sizes will help you be aware of how much food you are eating at restaurants. The list below tells you how big or small some common portion sizes are based on everyday objects.  1 oz--4 stacked dice.  3 oz--1 deck of cards.  1 tsp--1 dice.  1 Tbsp-- a Ping-Pong ball.  2 Tbsp--1 Ping-Pong ball.   cup--1 tennis ball or 1 cupcake wrapper.  1 cup--1 baseball.   This information is not intended to replace advice given to you by your health care provider. Make sure you discuss any questions you have with your health care provider.   Document Released: 09/15/2005 Document Revised: 10/06/2014 Document Reviewed: 07/21/2013 Elsevier Interactive Patient Education [employer].

## 2016-06-17 DIAGNOSIS — F321 Major depressive disorder, single episode, moderate: Secondary | ICD-10-CM | POA: Diagnosis not present

## 2016-06-20 NOTE — Progress Notes (Signed)
  Oncology Nurse Navigator Documentation Confirmed receipt of consult for Urogyn Navigator Location: [facility] (06/20/16 1200) Navigator Encounter Type: Other (06/20/16 1200)                   Interventions: Referrals (06/20/16 1200)                      Time Spent with Patient: 15 (06/20/16 1200)

## 2016-06-29 HISTORY — PX: UMBILICAL HERNIA REPAIR: SHX196

## 2016-07-01 DIAGNOSIS — F321 Major depressive disorder, single episode, moderate: Secondary | ICD-10-CM | POA: Diagnosis not present

## 2016-07-08 ENCOUNTER — Telehealth: Payer: Self-pay | Admitting: *Deleted

## 2016-07-08 NOTE — Telephone Encounter (Signed)
Started having abd pain Saturday and is getting worse, thought it was gas but meds are not helping. Having to sleep sitting up Temp elevated 101, H/A this morning abd still hurting no appetite at all. Fever broke after taking Tylenol. She is currently in [place]. Advised to go to Urgent Care for evaluation

## 2016-07-09 ENCOUNTER — Ambulatory Visit
Admission: RE | Admit: 2016-07-09 | Discharge: 2016-07-09 | Disposition: A | Discharge status: Home / Self Care | Payer: BLUE CROSS/BLUE SHIELD | Source: Ambulatory Visit | Attending: Student | Admitting: Student

## 2016-07-09 ENCOUNTER — Emergency Department
Admission: EM | Admit: 2016-07-09 | Discharge: 2016-07-10 | Disposition: A | Discharge status: Home / Self Care | Payer: BLUE CROSS/BLUE SHIELD | Attending: Emergency Medicine | Admitting: Emergency Medicine

## 2016-07-09 ENCOUNTER — Other Ambulatory Visit: Payer: Self-pay | Admitting: Student

## 2016-07-09 ENCOUNTER — Encounter: Payer: Self-pay | Admitting: *Deleted

## 2016-07-09 ENCOUNTER — Emergency Department: Payer: BLUE CROSS/BLUE SHIELD

## 2016-07-09 DIAGNOSIS — K760 Fatty (change of) liver, not elsewhere classified: Secondary | ICD-10-CM

## 2016-07-09 DIAGNOSIS — Z85048 Personal history of other malignant neoplasm of rectum, rectosigmoid junction, and anus: Secondary | ICD-10-CM | POA: Insufficient documentation

## 2016-07-09 DIAGNOSIS — Z933 Colostomy status: Secondary | ICD-10-CM | POA: Insufficient documentation

## 2016-07-09 DIAGNOSIS — R509 Fever, unspecified: Secondary | ICD-10-CM

## 2016-07-09 DIAGNOSIS — R1011 Right upper quadrant pain: Secondary | ICD-10-CM

## 2016-07-09 DIAGNOSIS — K8050 Calculus of bile duct without cholangitis or cholecystitis without obstruction: Secondary | ICD-10-CM | POA: Diagnosis not present

## 2016-07-09 DIAGNOSIS — J45909 Unspecified asthma, uncomplicated: Secondary | ICD-10-CM | POA: Insufficient documentation

## 2016-07-09 DIAGNOSIS — K439 Ventral hernia without obstruction or gangrene: Secondary | ICD-10-CM

## 2016-07-09 DIAGNOSIS — R1013 Epigastric pain: Secondary | ICD-10-CM | POA: Diagnosis not present

## 2016-07-09 DIAGNOSIS — R1084 Generalized abdominal pain: Secondary | ICD-10-CM

## 2016-07-09 DIAGNOSIS — Z79899 Other long term (current) drug therapy: Secondary | ICD-10-CM | POA: Diagnosis not present

## 2016-07-09 DIAGNOSIS — Z9049 Acquired absence of other specified parts of digestive tract: Secondary | ICD-10-CM

## 2016-07-09 DIAGNOSIS — K8020 Calculus of gallbladder without cholecystitis without obstruction: Secondary | ICD-10-CM | POA: Diagnosis not present

## 2016-07-09 HISTORY — DX: Unspecified asthma, uncomplicated: J45.909

## 2016-07-09 LAB — URINALYSIS COMPLETE WITH MICROSCOPIC (ARMC ONLY)
Bacteria, UA: NONE SEEN
Bilirubin Urine: NEGATIVE
Glucose, UA: NEGATIVE mg/dL
Ketones, ur: NEGATIVE mg/dL
Leukocytes, UA: NEGATIVE
Nitrite: NEGATIVE
Protein, ur: 30 mg/dL — AB
Specific Gravity, Urine: 1.042 — ABNORMAL HIGH (ref 1.005–1.030)
pH: 6 (ref 5.0–8.0)

## 2016-07-09 MED ORDER — IOPAMIDOL (ISOVUE-300) INJECTION 61%
100.0000 mL | Freq: Once | INTRAVENOUS | Status: AC | PRN
  Administered 2016-07-09: 100 mL via INTRAVENOUS

## 2016-07-09 NOTE — Discharge Instructions (Signed)
Please return for return of pain, fever or vomiting.  Please call [facility]  to arrange out[patient follow up. Call them in the morning.

## 2016-07-09 NOTE — ED Notes (Addendum)
Pt reports was seen and [facility] today and had CT and blood work completed, reports was told had inflammed gallbladder. Pt states mid abdominal pain began Saturday after eating potato soup, reports pain had subsided, denies pain at this time. Reports had fever yesterday of 101 or 102 and had taken ibuprofen, last dose was at 5pm tonight. Pt is afebrile, pt alert and oriented x 4, no increased work in breathing. Family at bedside, call bell within reach.

## 2016-07-09 NOTE — ED Notes (Signed)
Verbal report received from Vicente Males, [place]

## 2016-07-09 NOTE — ED Provider Notes (Signed)
-----------------------------------------   11:51 PM on 07/09/2016 -----------------------------------------  At this time patient feels well she has no pain is moving around the room straightening the bed etc. she is not nauseated has no fever. I discussed her ultrasound and CT scan and clinical presentation with the surgeon Dr. Dahlia Byes, who recommends outpatient follow-up. Patient will return here if worse at all.   Nena Polio, MD 07/09/16 2352

## 2016-07-09 NOTE — ED Notes (Signed)
Blood work and CT already performed at [facility]. Results in chart care everywhere.

## 2016-07-09 NOTE — ED Notes (Signed)
Pt back from [place] with steady gait.

## 2016-07-09 NOTE — ED Provider Notes (Signed)
[facility] Emergency Department Provider Note   ____________________________________________   First MD Initiated Contact with Patient 07/09/16 1959     (approximate)  I have reviewed the triage vital signs and the nursing notes.   HISTORY  Chief Complaint Abdominal Pain   HPI Rachael Cowan is a 30 y.o. female with a history of rectal cancer status post colon resection and colostomy as well as hysterectomy was presenting with upper abdominal pain over the past5 days. She also reports intermittent fever at home. Says that the pain is cramping and in the epigastrium and radiates to her back. She is denying any pain at this time. Says that the pain worsens with eating. Rachael Cowan that she called into the [facility] today who ordered blood work as well as a CAT scan and found possible early cholecystitis. She then came to the emergency department for further evaluation. She admits to nausea but no vomiting.   Past Medical History:  Diagnosis Date  . Allergy   . Asthma   . Hypersomnolence disorder 2005   managed with metidate  . Rectal cancer ([place]) 05/13/2012   Resection with colostomy and hysterectomy.  . Sleep apnea 2005   resolved with ENT surgery,  Rachael Cowan    Patient Active Problem List   Diagnosis Date Noted  . B12 deficiency 06/23/2013  . Obesity, unspecified 06/23/2013  . Other and unspecified hyperlipidemia 06/23/2013  . Abdominal pain 06/01/2012  . Febrile 06/01/2012  . Encounter for change or removal of nonsurgical wound dressing 05/28/2012  . Acute post-operative pain 05/14/2012  . Menopause 05/13/2012  . Airway hyperreactivity 05/07/2012  . Hypersomnia 05/07/2012  . Obstructive apnea 05/07/2012  . Rectal cancer ([place]) 01/13/2012  . Primary hypersomnolence disorder 11/12/2011  . Sleep apnea     Past Surgical History:  Procedure Laterality Date  . ABDOMINAL HYSTERECTOMY    . ARTHROSCOPIC REPAIR ACL  2006   right knee Dr Rachael Cowan  at [facility]  . COLON SURGERY    . COLONOSCOPY WITH PROPOFOL N/A 12/14/2015   Procedure: COLONOSCOPY WITH PROPOFOL;  Surgeon: Rachael Silvas, MD;  Location[facility]COPY;  Service: Endoscopy;  Laterality: N/A;    Prior to Admission medications   Medication Sig Start Date End Date Taking? Authorizing Provider  Cyanocobalamin (RA VITAMIN B-12 TR) 1000 MCG TBCR Take by mouth.    Historical Provider, MD  estrogens, conjugated, (PREMARIN) 0.9 MG tablet Take 1 tablet (0.9 mg total) by mouth daily. 11/15/15   Rachael Kanner, NP  loratadine (CLARITIN) 10 MG tablet Take 10 mg by mouth daily.    Historical Provider, MD  methylphenidate (METADATE CD) 30 MG CR capsule TAKE 1 CAPSULE BY ORAL ROUTE EVERY DAY IN THE MORNING AND IN THE AFTERNOON 05/21/16   Historical Provider, MD  montelukast (SINGULAIR) 10 MG tablet TAKE 1 TABLET BY ORAL ROUTE EVERY DAY IN THE EVENING 03/11/16   Historical Provider, MD  Omega-3 Fatty Acids (FISH OIL PO) Take by mouth.    Historical Provider, MD  pantoprazole (PROTONIX) 40 MG tablet Take by mouth. 04/14/16 10/11/16  Historical Provider, MD    Allergies Review of patient's allergies indicates no known allergies.  Family History  Problem Relation Age of Onset  . Hyperlipidemia Neg Hx     Social History Social History  Substance Use Topics  . Smoking status: Never Smoker  . Smokeless tobacco: Never Used  . Alcohol use Yes    Review of Systems Constitutional: As above Eyes: No visual  changes. ENT: No sore throat. Cardiovascular: Denies chest pain. Respiratory: Denies shortness of breath. Gastrointestinal:  no vomiting.  No diarrhea.  No constipation. Genitourinary: Negative for dysuria. Musculoskeletal: Negative for back pain. Skin: Negative for rash. Neurological: Negative for headaches, focal weakness or numbness.  10-point ROS otherwise negative.  ____________________________________________   PHYSICAL EXAM:  VITAL SIGNS: ED Triage Vitals [07/09/16  1822]  Enc Vitals Group     BP 139/83     Pulse Rate (!) 115     Resp 16     Temp 98.3 F (36.8 C)     Temp Source Oral     SpO2 97 %     Weight 200 lb (90.7 kg)     Height 5\' 1"  (1.549 m)     Head Circumference      Peak Flow      Pain Score      Pain Loc      Pain Edu?      Excl. in [place]?     Constitutional: Alert and oriented. Well appearing and in no acute distress. Eyes: Conjunctivae are normal. PERRL. EOMI. Head: Atraumatic. Nose: No congestion/rhinnorhea. Mouth/Throat: Mucous membranes are moist.   Neck: No stridor.   Cardiovascular: Normal rate, regular rhythm. Grossly normal heart sounds.   Respiratory: Normal respiratory effort.  No retractions. Lungs CTAB. Gastrointestinal: Soft With mild epigastric as well as right upper quadrant tenderness to palpation. No distention.  No CVA tenderness. Left lower quadrant colostomy bag without any surrounding induration, erythema, tenderness or pus.  Musculoskeletal: No lower extremity tenderness nor edema.  No joint effusions. Neurologic:  Normal speech and language. No gross focal neurologic deficits are appreciated. No gait instability. Skin:  Skin is warm, dry and intact. No rash noted. Psychiatric: Mood and affect are normal. Speech and behavior are normal.  ____________________________________________   LABS (all labs ordered are listed, but only abnormal results are displayed)  Labs Reviewed  URINALYSIS COMPLETEWITH MICROSCOPIC ([place]) - Abnormal; Notable for the following:       Result Value   Color, Urine YELLOW (*)    APPearance CLEAR (*)    Specific Gravity, Urine 1.042 (*)    Hgb urine dipstick 2+ (*)    Protein, ur 30 (*)    Squamous Epithelial / LPF 0-5 (*)    All other components within normal limits   ____________________________________________  EKG   ____________________________________________  RADIOLOGY  Pending ultrasound of the right upper  quadrant. ____________________________________________   PROCEDURES  Procedure(s) performed:   Procedures  Critical Care performed:   ____________________________________________   INITIAL IMPRESSION / ASSESSMENT AND PLAN / ED COURSE  Pertinent labs & imaging results that were available during my care of the patient were reviewed by me and considered in my medical decision making (see chart for details).  ----------------------------------------- 11:18 PM on 07/09/2016 -----------------------------------------  Largely reassuring labs including normal bilirubin as well as transaminases. Also with a low white blood cell count of 3.8. However, with suspicious CAT scan as well as what appears to be cholelithiasis on my read of the ultrasound. Pending official read at this time. Signed out to Dr. Rip Harbour.  Clinical Course     ____________________________________________   FINAL CLINICAL IMPRESSION(S) / ED DIAGNOSES  Final diagnoses:  RUQ abdominal pain      NEW MEDICATIONS STARTED DURING THIS VISIT:  New Prescriptions   No medications on file     Note:  This document was prepared using Dragon voice recognition software and may  include unintentional dictation errors.    Orbie Pyo, MD 07/09/16 [id number]

## 2016-07-09 NOTE — ED Notes (Signed)
Dr Malinda in to followup 

## 2016-07-09 NOTE — ED Triage Notes (Signed)
PT arrived to ED after having a CT performed and a inflamed gallbladder. Pt has hx of rectal cancer and current cholostomy. Pt reports new onset of abd pain Saturday and went to GI at [facility] for lab work and [place]. Pt reports she has been having fevers at home. Last fever was reported to be 103 and pt reports having taken ibuprofen at 17:00. Pt is afebrile at this time. Pt reports increased pain with eat and nausea and bloating on Saturday. No vomiting reported.

## 2016-07-11 ENCOUNTER — Encounter: Payer: Self-pay | Admitting: Surgery

## 2016-07-11 ENCOUNTER — Other Ambulatory Visit: Payer: Self-pay

## 2016-07-11 ENCOUNTER — Ambulatory Visit (INDEPENDENT_AMBULATORY_CARE_PROVIDER_SITE_OTHER): Payer: BLUE CROSS/BLUE SHIELD | Admitting: Surgery

## 2016-07-11 ENCOUNTER — Other Ambulatory Visit: Payer: Self-pay | Admitting: *Deleted

## 2016-07-11 VITALS — BP 119/83 | HR 89 | Temp 98.1°F | Ht 61.0 in | Wt 218.0 lb

## 2016-07-11 DIAGNOSIS — R1013 Epigastric pain: Secondary | ICD-10-CM | POA: Diagnosis not present

## 2016-07-11 DIAGNOSIS — C20 Malignant neoplasm of rectum: Secondary | ICD-10-CM | POA: Diagnosis not present

## 2016-07-11 DIAGNOSIS — D72819 Decreased white blood cell count, unspecified: Secondary | ICD-10-CM | POA: Diagnosis not present

## 2016-07-11 DIAGNOSIS — K8020 Calculus of gallbladder without cholecystitis without obstruction: Secondary | ICD-10-CM

## 2016-07-11 MED ORDER — ESTROGENS CONJUGATED 0.9 MG PO TABS: 0.9000 mg | ORAL_TABLET | Freq: Every day | ORAL | 11 refills | Status: DC

## 2016-07-11 NOTE — Patient Instructions (Signed)
Please make sure to have your labs drawn at the [facility]. Go to Registration and they will guide you. I have made you a follow up appointment for 2 weeks with DR. Loflin in the [place] location. Please call us if you have any questions or concerns.

## 2016-07-11 NOTE — Progress Notes (Signed)
07/11/2016  Rachael Cowan is an 30 y.o. female seen for the diagnosis of Rectal cancer ([place]) [C20].  She has a complex patient with complex history of rectal cancer that was diagnosed at age 72 she underwent an APR and a hysterectomy due to potential for cancer in that area she completed chemotherapy for this but 2014. She was recently seen in the emergency department on Wednesday because of severe pain in the epigastrium for a few days that caused severe nausea fevers up to 103 the did not come down with Tylenol and ibuprofen. Patient did have the ultrasound done at the time which showed some stones but gallbladder wall is 3 mm which is normal. Patient had normal liver enzymes that she did have a leukopenia of 3.8. Overall she is doing well now with no acute complaints. she denies N/V, D/C, fevers or malaise. Her abdominal pain has now resolved.   Past Medical History:  Diagnosis Date  . Allergy   . Asthma   . Hypersomnolence disorder 2005   managed with metidate  . Rectal cancer ([place]) 05/13/2012   Resection with colostomy and hysterectomy.  . Sleep apnea 2005   resolved with ENT surgery,  Rachael Cowan    Past Surgical History:  Procedure Laterality Date  . ABDOMINAL HYSTERECTOMY    . ARTHROSCOPIC REPAIR ACL  2006   right knee Dr Donna Christen at [facility]  . COLON SURGERY    . COLONOSCOPY WITH PROPOFOL N/A 12/14/2015   Procedure: COLONOSCOPY WITH PROPOFOL;  Surgeon: Manya Silvas, MD;  Location: [facility]OSCOPY;  Service: Endoscopy;  Laterality: N/A;    Family History  Problem Relation Age of Onset  . Hyperlipidemia Neg Hx     Social History:  reports that she has never smoked. She has never used smokeless tobacco. She reports that she drinks alcohol. She reports that she does not use drugs.  Allergies: No Known Allergies  Medications reviewed.  Review of Systems  Constitutional: Negative for chills and fever.  HENT: Negative for congestion.   Respiratory: Negative for cough, sputum  production and shortness of breath.   Cardiovascular: Negative for chest pain and palpitations.  Gastrointestinal: Positive for abdominal pain and nausea. Negative for blood in stool, constipation, diarrhea, heartburn and vomiting.  Genitourinary: Positive for frequency, hematuria and urgency. Negative for dysuria and flank pain.  Musculoskeletal: Negative for back pain.  Skin: Negative for itching and rash.  Neurological: Negative for dizziness, loss of consciousness and headaches.  Psychiatric/Behavioral: The patient is not nervous/anxious.   All other systems reviewed and are negative.    Physical Exam:  BP 119/83 (BP Location: Left Arm, Patient Position: Sitting)   Pulse 89   Temp 98.1 F (36.7 C) (Oral)   Ht 5\' 1"  (1.549 m)   Wt 218 lb (98.9 kg)   LMP 10/25/2011   BMI 41.19 kg/m   Physical Exam  Constitutional: She is oriented to person, place, and time. She appears well-developed and well-nourished. No distress.  HENT:  Head: Normocephalic and atraumatic.  Right Ear: External ear normal.  Left Ear: External ear normal.  Nose: Nose normal.  Mouth/Throat: Oropharynx is clear and moist. No oropharyngeal exudate.  Eyes: Conjunctivae and EOM are normal. Pupils are equal, round, and reactive to light. No scleral icterus.  Neck: Normal range of motion. Neck supple. No tracheal deviation present.  Cardiovascular: Normal rate, regular rhythm, normal heart sounds and intact distal pulses.  Exam reveals no gallop and no friction rub.   No murmur  heard. Pulmonary/Chest: Effort normal and breath sounds normal. No respiratory distress. She has no wheezes. She has no rales.  Abdominal: Soft. Bowel sounds are normal. She exhibits no distension. There is no tenderness. There is no rebound.  Left lower quadrant colostomy healthy with some laxity indicating a hernia no tenderness in the epigastrium or right upper quadrant today no CVA tenderness either.  Musculoskeletal: Normal range of  motion. She exhibits no edema, tenderness or deformity.  Neurological: She is alert and oriented to person, place, and time. No cranial nerve deficit.  Skin: Skin is warm and dry. No rash noted. No erythema. No pallor.  Psychiatric: She has a normal mood and affect. Her behavior is normal. Judgment and thought content normal.  Vitals reviewed.   Results for orders placed or performed during the hospital encounter of 07/09/16 (from the past 48 hour(s))  Urinalysis complete, with microscopic ([facility] only)     Status: Abnormal   Collection Time: 07/09/16  9:56 PM  Result Value Ref Range   Color, Urine YELLOW (A) YELLOW   APPearance CLEAR (A) CLEAR   Glucose, UA NEGATIVE NEGATIVE mg/dL   Bilirubin Urine NEGATIVE NEGATIVE   Ketones, ur NEGATIVE NEGATIVE mg/dL   Specific Gravity, Urine 1.042 (H) 1.005 - 1.030   Hgb urine dipstick 2+ (A) NEGATIVE   pH 6.0 5.0 - 8.0   Protein, ur 30 (A) NEGATIVE mg/dL   Nitrite NEGATIVE NEGATIVE   Leukocytes, UA NEGATIVE NEGATIVE   RBC / HPF 0-5 0 - 5 RBC/hpf   WBC, UA 0-5 0 - 5 WBC/hpf   Bacteria, UA NONE SEEN NONE SEEN   Squamous Epithelial / LPF 0-5 (A) NONE SEEN   Ct Abdomen Pelvis W Contrast  Result Date: 07/09/2016 CLINICAL DATA:  Fever and nausea with epigastric pain for 4 days. History of previous rectal carcinoma EXAM: CT ABDOMEN AND PELVIS WITH CONTRAST TECHNIQUE: Multidetector CT imaging of the abdomen and pelvis was performed using the standard protocol following bolus administration of intravenous contrast. Oral contrast was also administered. CONTRAST:  110mL ISOVUE-300 IOPAMIDOL (ISOVUE-300) INJECTION 61% COMPARISON:  October 16, 2015 FINDINGS: Lower chest: Lung bases are clear. Hepatobiliary: There is hepatic steatosis. No focal liver lesions are evident. Gallbladder wall appears mildly thickened and edematous. There is no biliary duct dilatation. Pancreas: There is no pancreatic mass or inflammatory focus. Spleen: No splenic lesions are evident.  Adrenals/Urinary Tract: Adrenals appear normal bilaterally. Kidneys bilaterally show no mass or hydronephrosis on either side. There is no renal or ureteral calculus on either side. Urinary bladder is decompressed without wall thickening. Stomach/Bowel: The patient has had a distal colectomy with a left lower quadrant sigmoid end colostomy. There is herniation of fat into the area of the ostomy, also present on previous study without significant change. No bowel compromise is seen in this area. There is no bowel wall or mesenteric thickening. No bowel obstruction. No evidence of free air or portal venous air. Vascular/Lymphatic: There is no abdominal aortic aneurysm. No vascular lesion is evident. There are scattered stable appearing subcentimeter lymph nodes in the mesentery. By size criteria, there is no adenopathy in the abdomen or pelvis. Reproductive: Uterus is absent.  No pelvic mass evident. Other: There is less presacral soft tissue thickening compared to prior study. There is no inflammation or evidence of mass in the presacral region in the area of previous rectal removal. There is no peri appendiceal region inflammation. No abscess or ascites evident in the abdomen or pelvis. There is a  moderate umbilical level ventral hernia which contains fat but no bowel. A smaller hernia in the midline superior to the umbilical hernia contains only fat and is stable. Musculoskeletal: There are no blastic or lytic bone lesions. No intramuscular lesions are evident. IMPRESSION: Gallbladder wall appears somewhat edematous. Question early cholecystitis. Correlation with ultrasound of the gallbladder may be advisable. Status post low anterior resection. There is scarring but no inflammation or evidence of mass in the presacral/perirectal region. No bowel obstruction.  No abscess in the abdomen or pelvis. Ventral hernias remain stable, containing fat but no bowel. Left lower quadrant ostomy with fairly extensive herniation  of omental fat into the ostomy site. No bowel compromise in this area. Hepatic steatosis without focal liver lesion. No renal or ureteral calculus.  No hydronephrosis. Stable small mesenteric lymph nodes without frank adenopathy by size criteria, stable. These results will be called to the ordering clinician or representative by the Radiologist Assistant, and communication documented in the PACS or zVision Dashboard. Electronically Signed   By: Lowella Grip III M.D.   On: 07/09/2016 16:14   US Abdomen Limited Ruq  Result Date: 07/09/2016 CLINICAL DATA:  Right upper quadrant abdominal pain. EXAM: US ABDOMEN LIMITED - RIGHT UPPER QUADRANT COMPARISON:  CT abdomen/ pelvis earlier today. FINDINGS: Gallbladder: Physiologically distended. Multiple intraluminal gallstones. Stone in the gallbladder neck is non mobile. Borderline wall thickening of 3 mm. No sonographic Murphy sign noted by sonographer. No pericholecystic fluid Common bile duct: Diameter: 2 mm. Liver: No focal lesion identified. Diffusely increased in parenchymal echogenicity. Normal directional flow in the main portal vein. IMPRESSION: 1. Cholelithiasis with non mobile stone in the gallbladder neck. Borderline wall thickening of 3 mm. Negative sonographic Murphy sign, no pericholecystic fluid. Nuclear medicine HIDA scan may be helpful for further evaluation. 2. Hepatic steatosis. Electronically Signed   By: Jeb Levering M.D.   On: 07/09/2016 23:27    Assessment/Plan: Rachael Cowan is an 30 y.o. female seen for the diagnosis of Rectal cancer ([place]) [C20] and cholelithiasis.  I personally reviewed her past medical history which is quite complex with a history of an APR for rectal cancer as well as chemotherapy afterwards she did have leukopenia back in 2013 and 14 however that correlated with her last chemotherapy. I personally reviewed her laboratory values which do show a leukopenia with a white blood cell count of 3.8 with decrease in  lymphocytes but increased neutrophils her sedimentation rate was elevated at 37 as well as her CRP, her creatinine was slightly elevated at 1.2 but her BUN was normal at 11 she also had a UA that showed 2+ hemoglobin in the urine but was normal clear and was negative for nitrates and leukocytes or bacteria. I have personally reviewed her ultrasound images which show relatively normal gallbladder wall without any pericholecystic fluid but there is a stone in the gallbladder neck. I also personally reviewed her CT scan images which do show mild thickening of the gallbladder wall however no distinct huge inflammation fluid or any other pathology that I could distinctly identified on the CT scan of also reviewed the read as above.  I discussed with the patient typical gallbladder pain is in the epigastrium and can call some nausea along with it however usually doesn't cause fevers up to 103 and a leukopenia. She also has hemoglobin in her urine which may explain by her cystocele in the area and some urinary incontinence however not fully. I have a suspicion that she had a  bad virus is been going around and people in their late 69s to 30s with a have severe epigastric and abdominal pain and sometimes nausea and vomiting along with it and wonders if that's not what caused her elevation in her fever and perhaps a leukopenia from a viral illness.  If she was then decreasing much she was eating she have some distention of the gallbladder sometimes some slight thickening of the gallbladder wall. I discussed all this with the patient and I will have her eat a bland diet avoiding any fatty or spicy foods and she will come back in 2 weeks and get labs prior to to ensure that these are all improved.  Jennie Bolar L. Howard Patton MD General Surgeon  07/11/2016,2:17 PM

## 2016-07-14 ENCOUNTER — Telehealth: Payer: Self-pay | Admitting: *Deleted

## 2016-07-14 ENCOUNTER — Encounter: Payer: Self-pay | Admitting: Internal Medicine

## 2016-07-14 NOTE — Telephone Encounter (Signed)
Went to ER for abd pain, was told her GB is inflamed. Labs abnormal, WBC down and she has blood in her urine. Asking to be seen, her next appt is not until January. Please advise

## 2016-07-14 NOTE — Telephone Encounter (Signed)
  Ask patient to follow-up with PCP about blood in urine and gallbladder issues.  M

## 2016-07-14 NOTE — Telephone Encounter (Addendum)
Per Dr Mike Gip, patient advised to contact PCP. She stated she has an appt in a couple of weeks , I advised her to call today regarding this and not waiting.

## 2016-07-17 ENCOUNTER — Ambulatory Visit (INDEPENDENT_AMBULATORY_CARE_PROVIDER_SITE_OTHER): Payer: BLUE CROSS/BLUE SHIELD | Admitting: Internal Medicine

## 2016-07-17 ENCOUNTER — Encounter: Payer: Self-pay | Admitting: Internal Medicine

## 2016-07-17 VITALS — BP 120/90 | HR 75 | Temp 97.8°F | Resp 12 | Ht 61.75 in | Wt 219.8 lb

## 2016-07-17 DIAGNOSIS — R319 Hematuria, unspecified: Secondary | ICD-10-CM | POA: Diagnosis not present

## 2016-07-17 DIAGNOSIS — Z87448 Personal history of other diseases of urinary system: Secondary | ICD-10-CM

## 2016-07-17 DIAGNOSIS — R110 Nausea: Secondary | ICD-10-CM

## 2016-07-17 DIAGNOSIS — K8040 Calculus of bile duct with cholecystitis, unspecified, without obstruction: Secondary | ICD-10-CM

## 2016-07-17 DIAGNOSIS — D709 Neutropenia, unspecified: Secondary | ICD-10-CM

## 2016-07-17 DIAGNOSIS — G4710 Hypersomnia, unspecified: Secondary | ICD-10-CM

## 2016-07-17 DIAGNOSIS — K760 Fatty (change of) liver, not elsewhere classified: Secondary | ICD-10-CM

## 2016-07-17 DIAGNOSIS — Z85048 Personal history of other malignant neoplasm of rectum, rectosigmoid junction, and anus: Secondary | ICD-10-CM

## 2016-07-17 LAB — HEPATIC FUNCTION PANEL
ALT: 64 U/L — ABNORMAL HIGH (ref 0–35)
AST: 43 U/L — ABNORMAL HIGH (ref 0–37)
Albumin: 4.2 g/dL (ref 3.5–5.2)
Alkaline Phosphatase: 76 U/L (ref 39–117)
Bilirubin, Direct: 0 mg/dL (ref 0.0–0.3)
Total Bilirubin: 0.3 mg/dL (ref 0.2–1.2)
Total Protein: 7.1 g/dL (ref 6.0–8.3)

## 2016-07-17 LAB — HEMOGLOBIN A1C: Hgb A1c MFr Bld: 5.6 % (ref 4.6–6.5)

## 2016-07-17 LAB — POCT URINALYSIS DIPSTICK
Bilirubin, UA: NEGATIVE
Glucose, UA: NEGATIVE
Ketones, UA: NEGATIVE
Leukocytes, UA: NEGATIVE
Nitrite, UA: NEGATIVE
Spec Grav, UA: 1.025
Urobilinogen, UA: 0.2
pH, UA: 6

## 2016-07-17 LAB — CBC WITH DIFFERENTIAL/PLATELET
Basophils Absolute: 0 10*3/uL (ref 0.0–0.1)
Basophils Relative: 0.5 % (ref 0.0–3.0)
Eosinophils Absolute: 0.1 10*3/uL (ref 0.0–0.7)
Eosinophils Relative: 1.8 % (ref 0.0–5.0)
HCT: 36.6 % (ref 36.0–46.0)
Hemoglobin: 12.4 g/dL (ref 12.0–15.0)
Lymphocytes Relative: 33 % (ref 12.0–46.0)
Lymphs Abs: 1.9 10*3/uL (ref 0.7–4.0)
MCHC: 34 g/dL (ref 30.0–36.0)
MCV: 88.4 fl (ref 78.0–100.0)
Monocytes Absolute: 0.3 10*3/uL (ref 0.1–1.0)
Monocytes Relative: 5.6 % (ref 3.0–12.0)
Neutro Abs: 3.4 10*3/uL (ref 1.4–7.7)
Neutrophils Relative %: 59.1 % (ref 43.0–77.0)
Platelets: 224 10*3/uL (ref 150.0–400.0)
RBC: 4.14 Mil/uL (ref 3.87–5.11)
RDW: 14.3 % (ref 11.5–15.5)
WBC: 5.7 10*3/uL (ref 4.0–10.5)

## 2016-07-17 LAB — URINALYSIS, ROUTINE W REFLEX MICROSCOPIC
Bilirubin Urine: NEGATIVE
Ketones, ur: NEGATIVE
Leukocytes, UA: NEGATIVE
Nitrite: NEGATIVE
Specific Gravity, Urine: 1.025 (ref 1.000–1.030)
Total Protein, Urine: NEGATIVE
Urine Glucose: NEGATIVE
Urobilinogen, UA: 0.2 (ref 0.0–1.0)
pH: 6 (ref 5.0–8.0)

## 2016-07-17 LAB — CORTISOL: Cortisol, Plasma: 8 ug/dL

## 2016-07-17 LAB — SEDIMENTATION RATE: Sed Rate: 11 mm/hr (ref 0–20)

## 2016-07-17 NOTE — Progress Notes (Signed)
Pre-visit discussion using our clinic review tool. No additional management support is needed unless otherwise documented below in the visit note.  

## 2016-07-17 NOTE — Patient Instructions (Signed)
You did not have hematuria (there were no red blood cells in your urine),  So kidney infection is unlikely  We are repeating the urinalysis and blood work today   Fatty Liver Fatty liver, also called hepatic steatosis or steatohepatitis, is a condition in which too much fat has built up in your liver cells. The liver removes harmful substances from your bloodstream. It produces fluids your body needs. It also helps your body use and store energy from the food you eat. In many cases, fatty liver does not cause symptoms or problems. It is often diagnosed when tests are being done for other reasons. However, over time, fatty liver can cause inflammation that may lead to more serious liver problems, such as scarring of the liver (cirrhosis). CAUSES  Causes of fatty liver may include:   Drinking too much alcohol.  Poor nutrition.  Obesity.  Cushing syndrome.  Diabetes.  Hyperlipidemia.  Pregnancy.  Certain drugs.  Poisons.  Some viral infections. RISK FACTORS You may be more likely to develop fatty liver if you:  Abuse alcohol.  Are pregnant.  Are overweight.  Have diabetes.  Have hepatitis.  Have a high triglyceride level.  SIGNS AND SYMPTOMS  Fatty liver often does not cause any symptoms. In cases where symptoms develop, they can include:  Fatigue.  Weakness.  Weight loss.  Confusion.   Abdominal pain.  Yellowing of your skin and the white parts of your eyes (jaundice).  Nausea and vomiting. DIAGNOSIS  Fatty liver may be diagnosed by:   Physical exam and medical history.  Blood tests.  Imaging tests, such as an ultrasound, CT scan, or MRI.  Liver biopsy. A small sample of liver tissue is removed using a needle. The sample is then looked at under a microscope. TREATMENT  Fatty liver is often caused by other health conditions. Treatment for fatty liver may involve medicines and lifestyle changes to manage conditions such as:   Alcoholism.  High  cholesterol.  Diabetes.  Being overweight or obese.  HOME CARE INSTRUCTIONS  Eat a healthy diet as directed by your health care provider.  Exercise regularly. This can help you lose weight and control your cholesterol and diabetes. Talk to your health care provider about an exercise plan and which activities are best for you.  Do not drink alcohol.   Take medicines only as directed by your health care provider. SEEK MEDICAL CARE IF: You have difficulty controlling your:  Blood sugar.  Cholesterol.  Alcohol consumption. SEEK IMMEDIATE MEDICAL CARE IF:  You have abdominal pain.  You have jaundice.  You have nausea and vomiting.   This information is not intended to replace advice given to you by your health care provider. Make sure you discuss any questions you have with your health care provider.   Document Released: 10/31/2005 Document Revised: 10/06/2014 Document Reviewed: 01/25/2014 Elsevier Interactive Patient Education [employer].

## 2016-07-17 NOTE — Progress Notes (Addendum)
Subjective:  Patient ID: Rachael Cowan, female    DOB: 04-13-1986  Age: 30 y.o. MRN: [id number]  CC: The primary encounter diagnosis was Hematuria, unspecified type. Diagnoses of Neutropenia, unspecified type ([place]), Nausea without vomiting, Fatty liver, History of hematuria, Hepatic steatosis, History of rectal cancer, Hypersomnia, and Calculus of bile duct with cholecystitis without obstruction, unspecified cholecystitis acuity were also pertinent to this visit.  HPI Rachael Cowan presents for establishment of care.  History of rectal cancer at age 37, s/p APR and hiysterectomy and chemo in 2014.  Treated in ED on Oct 11 recently for illness  that started 4 days prior to presentation  with  mild abdominal  fullness,  Not pain.  Was uncomfortable   Feeling resolved after 48 hours,followed by waking up with high fever and nausea 2 days prior to ER visit.  thought it might be diverticulitis,  Dr Elliott's office scheduled CT and labs through [facility], then directed her to ER after seeing the results.  told she had gallstones and thickened GB wall concerning for acute cholecystitis.  Since she was pain free at time of ER she was referred to general surgery  Who saw her on  Oct 13 .  There was  Additional  Concern a about  " hematuria"  (not sure where this is coming from ,  UA had 0-5 RBC's) . Patient has been having altered sense of urination , not true pain or dysuria, and no pink tinge or frank blood. No vaginal discharge.  Not sexually active. S/p hysterectomy  Last fever was a week ago and no antibiotics  Have been prescribed.       Seen by Oncology as well since ER visit  Symptoms of abd pain have resolved   Has 2 week follow up with [facility] .   CT abd pelvis with contrast as done on Oct 11 : fatty liver , gallstones and sludge noted. Marland Kitchen    History Rachael Cowan has a past medical history of Allergy; Asthma; Hypersomnolence disorder (2005); Rectal cancer ([place]) (05/13/2012); and Sleep apnea (2005).    She has a past surgical history that includes Arthroscopic repair ACL (2006); Abdominal hysterectomy; Colon surgery; Colonoscopy with propofol (N/A, 12/14/2015); Colostomy Placement; and Tonsillectomy and adenoidectomy.   Her family history is not on file.She reports that she has never smoked. She has never used smokeless tobacco. She reports that she drinks alcohol. She reports that she does not use drugs.  Outpatient Medications Prior to Visit  Medication Sig Dispense Refill  . Cyanocobalamin (RA VITAMIN B-12 TR) 1000 MCG TBCR Take by mouth.    . estrogens, conjugated, (PREMARIN) 0.9 MG tablet Take 1 tablet (0.9 mg total) by mouth daily. 30 tablet 11  . loratadine (CLARITIN) 10 MG tablet Take 10 mg by mouth daily.    . methylphenidate (METADATE CD) 30 MG CR capsule TAKE 1 CAPSULE BY ORAL ROUTE EVERY DAY IN THE MORNING AND IN THE AFTERNOON  0  . montelukast (SINGULAIR) 10 MG tablet TAKE 1 TABLET BY ORAL ROUTE EVERY DAY IN THE EVENING  6  . Omega-3 Fatty Acids (FISH OIL PO) Take by mouth.    . pantoprazole (PROTONIX) 40 MG tablet Take by mouth.     No facility-administered medications prior to visit.     Review of Systems:  Patient denies headache, fevers, malaise, unintentional weight loss, skin rash, eye pain, sinus congestion and sinus pain, sore throat, dysphagia,  hemoptysis , cough, dyspnea, wheezing, chest pain, palpitations,  orthopnea, edema, abdominal pain, nausea, melena, diarrhea, constipation, flank pain, dysuria, hematuria, urinary  Frequency, nocturia, numbness, tingling, seizures,  Focal weakness, Loss of consciousness,  Tremor, insomnia, depression, anxiety, and suicidal ideation.     Objective:  BP 120/90   Pulse 75   Temp 97.8 F (36.6 C) (Oral)   Resp 12   Ht 5' 1.75" (1.568 m)   Wt 219 lb 12 oz (99.7 kg)   LMP 10/25/2011   SpO2 99%   BMI 40.52 kg/m   Physical Exam:  General appearance: alert, cooperative and appears stated age Head: Normocephalic, without  obvious abnormality, atraumatic Eyes: conjunctivae/corneas clear. PERRL, EOM's intact. Fundi benign. Ears: normal TM's and external ear canals both ears Nose: Nares normal. Septum midline. Mucosa normal. No drainage or sinus tenderness. Throat: lips, mucosa, and tongue normal; teeth and gums normal Neck: no adenopathy, no carotid bruit, no JVD, supple, symmetrical, trachea midline and thyroid not enlarged, symmetric, no tenderness/mass/nodules Lungs: clear to auscultation bilaterally Breasts: normal appearance, no masses or tenderness Heart: regular rate and rhythm, S1, S2 normal, no murmur, click, rub or gallop Abdomen: soft, tender to deep palpation in mid epigastric area.  No rebound,; bowel sounds normal; no masses,  no organomegaly Extremities: extremities normal, atraumatic, no cyanosis or edema Pulses: 2+ and symmetric Skin: Skin color, texture, turgor normal. No rashes or lesions Neurologic: Alert and oriented X 3, normal strength and tone. Normal symmetric reflexes. Normal coordination and gait.    Assessment & Plan:   Problem List Items Addressed This Visit    Gallstones without obstruction of gallbladder    Patient has follow up wit Gen Surg next week. Discussed potential use of HIDA scan to determine need for surgery. Repeat LFTs done today are mildly elevated bu more consistent with fatty liver, not cholestasis .   Lab Results  Component Value Date   ALT 72 (H) 07/20/2016   AST 49 (H) 07/20/2016   ALKPHOS 83 07/20/2016   BILITOT 0.9 07/20/2016         History of rectal cancer    She has relocated from [place], [place] to [place] an dhas been referred to local Oncology for follow up      Hypersomnia    Managed with Metadate.       History of hematuria    The only available urinalysis prior to today's was negative for RBCs.  Repeat UA with micro  Is NEGATIVE for hematuria, and culture was negative.  Patient has no history of gross hematuria either. No further  workup needed       Hepatic steatosis    Suggested  by CT report , with elevated ALT/AST (mild) .discussed diagnosis and need to rule out  Causes with serologies for autoimmune causes of hepatitis.   all modifiable risk factors including obesity, and hyperlipidemia have been addressed   Lab Results  Component Value Date   ALT 64 (H) 07/17/2016   AST 43 (H) 07/17/2016   ALKPHOS 76 07/17/2016   BILITOT 0.3 07/17/2016          Other Visit Diagnoses    Hematuria, unspecified type    -  Primary   Relevant Orders   POCT urinalysis dipstick (Completed)   Urinalysis, Routine w reflex microscopic (not at [facility]) (Completed)   Urine culture (Completed)   Sed Rate (ESR) (Completed)   Neutropenia, unspecified type ([place])       Relevant Orders   CBC with Differential/Platelet (Completed)   Nausea without vomiting  Relevant Orders   Hepatic function panel (Completed)   Sed Rate (ESR) (Completed)   Fatty liver       Relevant Orders   Hemoglobin A1c (Completed)   Cortisol (Completed)    A total of 45 minutes was spent with patient more than half of which was spent in counseling patient on the above mentioned issues , reviewing and explaining recent labs and imaging studies done, and coordination of care.  I am having Ms. Chillemi maintain her pantoprazole, Cyanocobalamin, Omega-3 Fatty Acids (FISH OIL PO), montelukast, loratadine, methylphenidate, and estrogens (conjugated).  No orders of the defined types were placed in this encounter.   There are no discontinued medications.  Follow-up: No Follow-up on file.   [name], MD

## 2016-07-19 DIAGNOSIS — K760 Fatty (change of) liver, not elsewhere classified: Secondary | ICD-10-CM | POA: Insufficient documentation

## 2016-07-19 DIAGNOSIS — Z87448 Personal history of other diseases of urinary system: Secondary | ICD-10-CM | POA: Insufficient documentation

## 2016-07-19 LAB — URINE CULTURE: Organism ID, Bacteria: NO GROWTH

## 2016-07-19 NOTE — Assessment & Plan Note (Signed)
Suggested  by CT report , with elevated ALT/AST (mild) .discussed diagnosis and need to rule out  Causes with serologies for autoimmune causes of hepatitis.   all modifiable risk factors including obesity, and hyperlipidemia have been addressed   Lab Results  Component Value Date   ALT 64 (H) 07/17/2016   AST 43 (H) 07/17/2016   ALKPHOS 76 07/17/2016   BILITOT 0.3 07/17/2016

## 2016-07-19 NOTE — Assessment & Plan Note (Signed)
She has relocated from [place], [place] to [place] an dhas been referred to local Oncology for follow up

## 2016-07-19 NOTE — Assessment & Plan Note (Signed)
Managed with Metadate.

## 2016-07-19 NOTE — Assessment & Plan Note (Addendum)
The only available urinalysis prior to today's was negative for RBCs.  Repeat UA with micro  Is NEGATIVE for hematuria, and culture was negative.  Patient has no history of gross hematuria either. No further workup needed

## 2016-07-20 ENCOUNTER — Emergency Department: Payer: BLUE CROSS/BLUE SHIELD

## 2016-07-20 ENCOUNTER — Encounter: Payer: Self-pay | Admitting: Emergency Medicine

## 2016-07-20 ENCOUNTER — Inpatient Hospital Stay
Admission: EM | Admit: 2016-07-20 | Discharge: 2016-07-23 | DRG: 415 | Disposition: A | Discharge status: Home / Self Care | Payer: BLUE CROSS/BLUE SHIELD | Attending: Surgery | Admitting: Surgery

## 2016-07-20 ENCOUNTER — Encounter: Payer: Self-pay | Admitting: Internal Medicine

## 2016-07-20 DIAGNOSIS — R1013 Epigastric pain: Secondary | ICD-10-CM | POA: Diagnosis not present

## 2016-07-20 DIAGNOSIS — Z933 Colostomy status: Secondary | ICD-10-CM | POA: Diagnosis not present

## 2016-07-20 DIAGNOSIS — C20 Malignant neoplasm of rectum: Secondary | ICD-10-CM | POA: Insufficient documentation

## 2016-07-20 DIAGNOSIS — K8020 Calculus of gallbladder without cholecystitis without obstruction: Secondary | ICD-10-CM | POA: Diagnosis not present

## 2016-07-20 DIAGNOSIS — Z9221 Personal history of antineoplastic chemotherapy: Secondary | ICD-10-CM

## 2016-07-20 DIAGNOSIS — K432 Incisional hernia without obstruction or gangrene: Secondary | ICD-10-CM | POA: Diagnosis not present

## 2016-07-20 DIAGNOSIS — Z923 Personal history of irradiation: Secondary | ICD-10-CM | POA: Diagnosis not present

## 2016-07-20 DIAGNOSIS — Z85048 Personal history of other malignant neoplasm of rectum, rectosigmoid junction, and anus: Secondary | ICD-10-CM | POA: Diagnosis not present

## 2016-07-20 DIAGNOSIS — K8000 Calculus of gallbladder with acute cholecystitis without obstruction: Secondary | ICD-10-CM | POA: Diagnosis not present

## 2016-07-20 DIAGNOSIS — G4730 Sleep apnea, unspecified: Secondary | ICD-10-CM | POA: Diagnosis not present

## 2016-07-20 DIAGNOSIS — Z5331 Laparoscopic surgical procedure converted to open procedure: Secondary | ICD-10-CM

## 2016-07-20 DIAGNOSIS — K810 Acute cholecystitis: Secondary | ICD-10-CM | POA: Diagnosis present

## 2016-07-20 DIAGNOSIS — Z6841 Body Mass Index (BMI) 40.0 and over, adult: Secondary | ICD-10-CM

## 2016-07-20 LAB — COMPREHENSIVE METABOLIC PANEL
ALT: 72 U/L — ABNORMAL HIGH (ref 14–54)
AST: 49 U/L — ABNORMAL HIGH (ref 15–41)
Albumin: 3.9 g/dL (ref 3.5–5.0)
Alkaline Phosphatase: 83 U/L (ref 38–126)
Anion gap: 11 (ref 5–15)
BUN: 9 mg/dL (ref 6–20)
CO2: 21 mmol/L — ABNORMAL LOW (ref 22–32)
Calcium: 9.2 mg/dL (ref 8.9–10.3)
Chloride: 104 mmol/L (ref 101–111)
Creatinine, Ser: 0.8 mg/dL (ref 0.44–1.00)
GFR calc Af Amer: >60 mL/min (ref >60)
GFR calc non Af Amer: >60 mL/min (ref >60)
Glucose, Bld: 132 mg/dL — ABNORMAL HIGH (ref 65–99)
Potassium: 3.5 mmol/L (ref 3.5–5.1)
Sodium: 136 mmol/L (ref 135–145)
Total Bilirubin: 0.9 mg/dL (ref 0.3–1.2)
Total Protein: 7.3 g/dL (ref 6.5–8.1)

## 2016-07-20 LAB — URINALYSIS COMPLETE WITH MICROSCOPIC (ARMC ONLY)
Bilirubin Urine: NEGATIVE
Glucose, UA: NEGATIVE mg/dL
Leukocytes, UA: NEGATIVE
Nitrite: NEGATIVE
Protein, ur: NEGATIVE mg/dL
Specific Gravity, Urine: 1.012 (ref 1.005–1.030)
pH: 6 (ref 5.0–8.0)

## 2016-07-20 LAB — LIPASE, BLOOD: Lipase: 17 U/L (ref 11–51)

## 2016-07-20 LAB — CBC
HCT: 38 % (ref 35.0–47.0)
Hemoglobin: 12.7 g/dL (ref 12.0–16.0)
MCH: 29.7 pg (ref 26.0–34.0)
MCHC: 33.5 g/dL (ref 32.0–36.0)
MCV: 88.7 fL (ref 80.0–100.0)
Platelets: 212 10*3/uL (ref 150–440)
RBC: 4.29 MIL/uL (ref 3.80–5.20)
RDW: 14.7 % — ABNORMAL HIGH (ref 11.5–14.5)
WBC: 15.1 10*3/uL — ABNORMAL HIGH (ref 3.6–11.0)

## 2016-07-20 MED ORDER — HYDROMORPHONE HCL 1 MG/ML IJ SOLN
0.5000 mg | INTRAMUSCULAR | Status: DC | PRN
  Administered 2016-07-20: 0.5 mg via INTRAVENOUS

## 2016-07-20 MED ORDER — LACTATED RINGERS IV SOLN
INTRAVENOUS | Status: DC
  Administered 2016-07-20 – 2016-07-21 (×2): via INTRAVENOUS

## 2016-07-20 MED ORDER — INFLUENZA VAC SPLIT QUAD 0.5 ML IM SUSY: 0.5000 mL | PREFILLED_SYRINGE | INTRAMUSCULAR | Status: DC

## 2016-07-20 MED ORDER — ESTROGENS CONJUGATED 0.3 MG PO TABS
0.9000 mg | ORAL_TABLET | Freq: Every day | ORAL | Status: DC
  Administered 2016-07-20 – 2016-07-23 (×3): 0.9 mg via ORAL
  Filled 2016-07-20 (×4): qty 2
  Filled 2016-07-20: qty 3

## 2016-07-20 MED ORDER — ONDANSETRON HCL 4 MG/2ML IJ SOLN
4.0000 mg | Freq: Four times a day (QID) | INTRAMUSCULAR | Status: DC | PRN
  Administered 2016-07-20 – 2016-07-21 (×2): 4 mg via INTRAVENOUS

## 2016-07-20 MED ORDER — KETOROLAC TROMETHAMINE 30 MG/ML IJ SOLN
30.0000 mg | Freq: Four times a day (QID) | INTRAMUSCULAR | Status: DC
  Administered 2016-07-20 – 2016-07-23 (×11): 30 mg via INTRAVENOUS
  Filled 2016-07-20 (×10): qty 1

## 2016-07-20 MED ORDER — METHYLPHENIDATE HCL ER (CD) 30 MG PO CPCR
30.0000 mg | ORAL_CAPSULE | Freq: Every day | ORAL | Status: DC
Start: 2016-07-20 — End: 2016-07-23

## 2016-07-20 MED ORDER — HEPARIN SODIUM (PORCINE) 5000 UNIT/ML IJ SOLN: 5000.0000 [IU] | Freq: Three times a day (TID) | INTRAMUSCULAR | Status: DC

## 2016-07-20 MED ORDER — HYDROMORPHONE HCL 1 MG/ML IJ SOLN
INTRAMUSCULAR | Status: AC
  Administered 2016-07-20: 0.5 mg via INTRAVENOUS
  Filled 2016-07-20: qty 1

## 2016-07-20 MED ORDER — MONTELUKAST SODIUM 10 MG PO TABS
10.0000 mg | ORAL_TABLET | Freq: Every day | ORAL | Status: DC
  Administered 2016-07-20 – 2016-07-22 (×3): 10 mg via ORAL
  Filled 2016-07-20 (×3): qty 1

## 2016-07-20 MED ORDER — CEFAZOLIN IN D5W 1 GM/50ML IV SOLN
1.0000 g | Freq: Four times a day (QID) | INTRAVENOUS | Status: DC
  Administered 2016-07-20 – 2016-07-21 (×3): 1 g via INTRAVENOUS
  Filled 2016-07-20 (×5): qty 50

## 2016-07-20 MED ORDER — PNEUMOCOCCAL VAC POLYVALENT 25 MCG/0.5ML IJ INJ: 0.5000 mL | INJECTION | INTRAMUSCULAR | Status: DC

## 2016-07-20 MED ORDER — ONDANSETRON HCL 4 MG PO TABS: 4.0000 mg | ORAL_TABLET | Freq: Four times a day (QID) | ORAL | Status: DC | PRN

## 2016-07-20 MED ORDER — LORATADINE 10 MG PO TABS
10.0000 mg | ORAL_TABLET | Freq: Every day | ORAL | Status: DC
  Administered 2016-07-20 – 2016-07-23 (×3): 10 mg via ORAL
  Filled 2016-07-20 (×3): qty 1

## 2016-07-20 MED ORDER — ONDANSETRON HCL 4 MG/2ML IJ SOLN
INTRAMUSCULAR | Status: AC
  Administered 2016-07-20: 4 mg via INTRAVENOUS
  Filled 2016-07-20: qty 2

## 2016-07-20 MED ORDER — ENOXAPARIN SODIUM 40 MG/0.4ML ~~LOC~~ SOLN
40.0000 mg | Freq: Two times a day (BID) | SUBCUTANEOUS | Status: DC
Start: 2016-07-20 — End: 2016-07-23
  Administered 2016-07-20 – 2016-07-22 (×4): 40 mg via SUBCUTANEOUS
  Filled 2016-07-20 (×4): qty 0.4

## 2016-07-20 MED ORDER — PANTOPRAZOLE SODIUM 40 MG PO TBEC
40.0000 mg | DELAYED_RELEASE_TABLET | Freq: Every day | ORAL | Status: DC
  Administered 2016-07-20 – 2016-07-23 (×4): 40 mg via ORAL
  Filled 2016-07-20 (×4): qty 1

## 2016-07-20 NOTE — ED Notes (Signed)
Surgeon at bedside.  

## 2016-07-20 NOTE — ED Notes (Signed)
Pt reports pain went from a 2/10 to 4/10. MD notified. Instructed to hold pain meds until surgeon was down to see pt. Pt understanding.

## 2016-07-20 NOTE — H&P (Signed)
Rachael Cowan is an 30 y.o. female.    Chief Complaint: Right upper quadrant pain  HPI: This a patient with right upper quadrant and epigastric pain radiating straight through to her back and right side with some nausea but no emesis. She has a workup showing gallstones. She has had recent trips to the emergency room and a see Dr. Heath Lark in our office for colicky abdominal pain suggestive of biliary colic. On this workup she shows more worrisome findings of acute cholecystitis including elevated white blood cell count persistent pain and tenderness and thickened gallbladder wall with a stone near the duct or neck of the gallbladder. She denies fevers or chills although she had a fever 6 or 7 days ago to 103 and has abated. He's had no jaundice or acholic stools.  Of significance is a history of rectal cancer for which she had an APR performed at [facility] with a permanent left lower quadrant and a colostomy. She also has a right-sided IJ port in the subcutaneous position with a history of chemotherapy as well as radiotherapy. The port is functional but does not aspirate.  This patient's pain this time started on Wednesday and has been colicky off and on but starting last night it has been completely unrelenting and worsening.  Past Medical History:  Diagnosis Date  . Allergy   . Asthma   . Hypersomnolence disorder 2005   managed with metidate  . Rectal cancer ([place]) 05/13/2012   Resection with colostomy and hysterectomy.  . Sleep apnea 2005   resolved with ENT surgery,  [name]    Past Surgical History:  Procedure Laterality Date  . ABDOMINAL HYSTERECTOMY    . ARTHROSCOPIC REPAIR ACL  2006   right knee Dr Donna Christen at [facility]  . COLON SURGERY    . COLONOSCOPY WITH PROPOFOL N/A 12/14/2015   Procedure: COLONOSCOPY WITH PROPOFOL;  Surgeon: Manya Silvas, MD;  Location: [facility] ENDOSCOPY;  Service: Endoscopy;  Laterality: N/A;  . Colostomy Placement    . TONSILLECTOMY AND ADENOIDECTOMY       Family History  Problem Relation Age of Onset  . Hyperlipidemia Neg Hx    Social History:  reports that she has never smoked. She has never used smokeless tobacco. She reports that she drinks alcohol. She reports that she does not use drugs.  Allergies: No Known Allergies   (Not in a hospital admission)   Review of Systems  Constitutional: Positive for fever. Negative for chills, malaise/fatigue and weight loss.  HENT: Negative.   Eyes: Negative.   Respiratory: Negative.   Cardiovascular: Negative.   Gastrointestinal: Positive for abdominal pain and nausea. Negative for blood in stool, constipation, diarrhea, heartburn and vomiting.  Genitourinary: Negative.   Musculoskeletal: Negative.   Skin: Negative.   Neurological: Negative.   Endo/Heme/Allergies: Negative.   Psychiatric/Behavioral: Negative.      Physical Exam:  BP (!) 121/99   Pulse 100   Temp 98.5 F (36.9 C) (Oral)   Resp 16   Ht '5\' 1"'  (1.549 m)   Wt 219 lb (99.3 kg)   LMP 10/25/2011   SpO2 100%   BMI 41.38 kg/m   Physical Exam  Constitutional: She is oriented to person, place, and time and well-developed, well-nourished, and in no distress. No distress.  Morbidly obese no acute distress sitting in the upright position  HENT:  Head: Normocephalic and atraumatic.  Eyes: Pupils are equal, round, and reactive to light. Right eye exhibits no discharge. Left eye exhibits no  discharge. No scleral icterus.  Neck: Normal range of motion.  Cardiovascular: Normal rate, regular rhythm and normal heart sounds.   Pulmonary/Chest: Effort normal and breath sounds normal. No respiratory distress. She has no wheezes. She has no rales.  Abdominal: Soft. She exhibits no distension. There is tenderness. There is no rebound and no guarding.  Tenderness maximal in the right upper quadrant with a questionable unlikely positive Murphy sign.  There is a left lower quadrant and a colostomy which is functional and a  well-healed midline incision. There is a small umbilical or ventral hernia present which is reducible and nontender.  Musculoskeletal: Normal range of motion. She exhibits no edema or tenderness.  Lymphadenopathy:    She has no cervical adenopathy.  Neurological: She is alert and oriented to person, place, and time.  Skin: Skin is warm and dry. No rash noted. She is not diaphoretic. No erythema.  Psychiatric: Mood and affect normal.  Vitals reviewed.       Results for orders placed or performed during the hospital encounter of 07/20/16 (from the past 48 hour(s))  Lipase, blood     Status: None   Collection Time: 07/20/16  2:20 PM  Result Value Ref Range   Lipase 17 11 - 51 U/L  Comprehensive metabolic panel     Status: Abnormal   Collection Time: 07/20/16  2:20 PM  Result Value Ref Range   Sodium 136 135 - 145 mmol/L   Potassium 3.5 3.5 - 5.1 mmol/L   Chloride 104 101 - 111 mmol/L   CO2 21 (L) 22 - 32 mmol/L   Glucose, Bld 132 (H) 65 - 99 mg/dL   BUN 9 6 - 20 mg/dL   Creatinine, Ser 0.80 0.44 - 1.00 mg/dL   Calcium 9.2 8.9 - 10.3 mg/dL   Total Protein 7.3 6.5 - 8.1 g/dL   Albumin 3.9 3.5 - 5.0 g/dL   AST 49 (H) 15 - 41 U/L   ALT 72 (H) 14 - 54 U/L   Alkaline Phosphatase 83 38 - 126 U/L   Total Bilirubin 0.9 0.3 - 1.2 mg/dL   GFR calc non Af Amer >60 >60 mL/min   GFR calc Af Amer >60 >60 mL/min    Comment: (NOTE) The eGFR has been calculated using the CKD EPI equation. This calculation has not been validated in all clinical situations. eGFR's persistently <60 mL/min signify possible Chronic Kidney Disease.    Anion gap 11 5 - 15  CBC     Status: Abnormal   Collection Time: 07/20/16  2:20 PM  Result Value Ref Range   WBC 15.1 (H) 3.6 - 11.0 K/uL   RBC 4.29 3.80 - 5.20 MIL/uL   Hemoglobin 12.7 12.0 - 16.0 g/dL   HCT 38.0 35.0 - 47.0 %   MCV 88.7 80.0 - 100.0 fL   MCH 29.7 26.0 - 34.0 pg   MCHC 33.5 32.0 - 36.0 g/dL   RDW 14.7 (H) 11.5 - 14.5 %   Platelets 212 150  - 440 K/uL  Urinalysis complete, with microscopic     Status: Abnormal   Collection Time: 07/20/16  4:22 PM  Result Value Ref Range   Color, Urine YELLOW (A) YELLOW   APPearance CLEAR (A) CLEAR   Glucose, UA NEGATIVE NEGATIVE mg/dL   Bilirubin Urine NEGATIVE NEGATIVE   Ketones, ur 1+ (A) NEGATIVE mg/dL   Specific Gravity, Urine 1.012 1.005 - 1.030   Hgb urine dipstick 1+ (A) NEGATIVE   pH 6.0 5.0 -  8.0   Protein, ur NEGATIVE NEGATIVE mg/dL   Nitrite NEGATIVE NEGATIVE   Leukocytes, UA NEGATIVE NEGATIVE   RBC / HPF 0-5 0 - 5 RBC/hpf   WBC, UA 6-30 0 - 5 WBC/hpf   Bacteria, UA RARE (A) NONE SEEN   Squamous Epithelial / LPF 0-5 (A) NONE SEEN   Mucous PRESENT    US Abdomen Limited Ruq  Result Date: 07/20/2016 CLINICAL DATA:  Epigastric pain. EXAM: US ABDOMEN LIMITED - RIGHT UPPER QUADRANT COMPARISON:  07/09/2016 FINDINGS: Gallbladder: Within the gallbladder there is a 1.9 cm stone in the region of the gallbladder neck, non mobile on repositioning. Gallbladder wall is 5.0 mm. No sonographic Murphy's sign. Sludge is present within the gallbladder. No pericholecystic fluid. Common bile duct: Diameter: 2.1 mm Liver: The liver is echogenic. There is attenuation of the ultrasound wave, poor visualization of the internal hepatic architecture, and loss of definition of the diaphragm. No focal liver lesions are identified. IMPRESSION: Thickened gallbladder wall and gallstone within the gallbladder neck. Increased gallbladder wall thickening since the prior study suspicious for acute cholecystitis. Hepatic steatosis. Electronically Signed   By: Nolon Nations M.D.   On: 07/20/2016 17:32     Assessment/Plan  This a patient with likely acute cholecystitis she has had episodic pain over the last few weeks mostly in the last 5 days and is in the process of moving from [place] but has been to the emergency room now the second time and to see Dr. Azalee Course in the office.  She has nausea but no emesis  but because of her recurrent pain and now unrelenting pain with an elevated white blood cell count and findings on ultrasound and suggestive of acute cholecystitis M recommending admission to the hospital she'll be started on IV antibiotics and anti-emetics as well as IV analgesics. The question of whether or not to proceed with an attempt at a laparoscopic cholecystectomy was discussed with the patient and her family.  With the difficulty in obtaining access after radiation and a prior APR it may not be possible to access as her right upper quadrant but that is unpredictable. The procedure of a laparoscopic versus an open procedure were described for she and her family but initially I will keep her nothing by mouth after midnight hydrate her with IV fluids IV antibiotics IV analgesics and antiemetics. I will allow Dr. Mercie Eon to examine her and consider laparoscopic versus open procedure. Her LFTs are slightly elevated and she would warrant a cholangiogram regardless.  Over 70 minutes were spent discussing this with the patient family emergency room physician and nursing.  Florene Glen, MD, FACS

## 2016-07-20 NOTE — ED Notes (Signed)
Pt transported to ultrasound.

## 2016-07-20 NOTE — ED Triage Notes (Signed)
Pt states hx of rectal cancer with a current colostomy. Pt states was seen here 10/11 with after a CT was performed and showed an inflammed gallbladder. Pt states US revealed 3 gall stones. Pt states on Thursday ate half a [employer] pizza when she had sudden onset abdominal pain. Pt states woke up this morning with abdominal pain that has progressed. Pt c/o back pain, abdominal pain and R flank pain. Pt states she has a follow up appt with surgeon on Wednesday. Pt is states she feels like she has a low grade fever, no fever noted at this time.

## 2016-07-20 NOTE — ED Provider Notes (Signed)
Time Seen: Approximately1613  I have reviewed the triage notes  Chief Complaint: Abdominal Pain   History of Present Illness: Rachael Cowan is a 30 y.o. female who has a significant past surgical history of colon rectal cancer and has a current colostomy. The patient's been evaluated here for cholecystitis. She was seen on an outpatient basis by surgery who scheduled her for a follow-up appointment this Wednesday. She has had a negative CAT scan but a positive ultrasound. Patient states that she did have some pizza on Thursday and had sudden onset of pain at that time. She states the pain is consistently in the same place in the epigastric area and radiates toward the middle of her back. That pain resolved and then had return of the pain again rather intense approximately an hour prior to arrival. Felt like she had a fever at home. She denies any persistent nausea or vomiting.* She states her pain has significantly decreased from what it was earlier. She states she feels fine at present. Last time she had anything to eat was last evening  Past Medical History:  Diagnosis Date  . Allergy   . Asthma   . Hypersomnolence disorder 2005   managed with metidate  . Rectal cancer ([place]) 05/13/2012   Resection with colostomy and hysterectomy.  . Sleep apnea 2005   resolved with ENT surgery,  [name]    Patient Active Problem List   Diagnosis Date Noted  . History of hematuria 07/19/2016  . Hepatic steatosis 07/19/2016  . Cholelithiasis 07/11/2016  . B12 deficiency 06/23/2013  . Obesity, unspecified 06/23/2013  . Other and unspecified hyperlipidemia 06/23/2013  . Abdominal pain 06/01/2012  . Febrile 06/01/2012  . Menopause 05/13/2012  . Airway hyperreactivity 05/07/2012  . Hypersomnia 05/07/2012  . Obstructive apnea 05/07/2012  . History of rectal cancer 01/13/2012  . Primary hypersomnolence disorder 11/12/2011  . Sleep apnea     Past Surgical History:  Procedure Laterality Date   . ABDOMINAL HYSTERECTOMY    . ARTHROSCOPIC REPAIR ACL  2006   right knee Dr Donna Christen at [facility]  . COLON SURGERY    . COLONOSCOPY WITH PROPOFOL N/A 12/14/2015   Procedure: COLONOSCOPY WITH PROPOFOL;  Surgeon: Manya Silvas, MD;  Location: [facility] ENDOSCOPY;  Service: Endoscopy;  Laterality: N/A;  . Colostomy Placement    . TONSILLECTOMY AND ADENOIDECTOMY      Past Surgical History:  Procedure Laterality Date  . ABDOMINAL HYSTERECTOMY    . ARTHROSCOPIC REPAIR ACL  2006   right knee Dr Donna Christen at [facility]  . COLON SURGERY    . COLONOSCOPY WITH PROPOFOL N/A 12/14/2015   Procedure: COLONOSCOPY WITH PROPOFOL;  Surgeon: Manya Silvas, MD;  Location: [facility] ENDOSCOPY;  Service: Endoscopy;  Laterality: N/A;  . Colostomy Placement    . TONSILLECTOMY AND ADENOIDECTOMY      Current Outpatient Rx  . Order #: [id number] Class: Historical Med  . Order #: [id number] Class: Normal  . Order #: [id number] Class: Historical Med  . Order #: [id number] Class: Historical Med  . Order #: [id number] Class: Historical Med  . Order #: [id number] Class: Historical Med  . Order #: [id number] Class: Historical Med    Allergies:  Review of patient's allergies indicates no known allergies.  Family History: Family History  Problem Relation Age of Onset  . Hyperlipidemia Neg Hx     Social History: Social History  Substance Use Topics  . Smoking status: Never Smoker  . Smokeless tobacco: Never Used  . Alcohol use Yes  Review of Systems:   10 point review of systems was performed and was otherwise negative:  Constitutional: NoCurrent fever Eyes: No visual disturbances ENT: No sore throat, ear pain Cardiac: No chest pain Respiratory: No shortness of breath, wheezing, or stridor Abdomen: Pain was primarily in the epigastric area radiating to the middle back and toward the right flank area. Endocrine: No weight loss, No night sweats Extremities: No peripheral edema, cyanosis Skin: No rashes, easy  bruising Neurologic: No focal weakness, trouble with speech or swollowing Urologic: No dysuria, Hematuria, or urinary frequency   Physical Exam:  ED Triage Vitals  Enc Vitals Group     BP 07/20/16 1347 115/64     Pulse Rate 07/20/16 1347 99     Resp 07/20/16 1347 15     Temp 07/20/16 1347 98.5 F (36.9 C)     Temp Source 07/20/16 1347 Oral     SpO2 07/20/16 1347 100 %     Weight 07/20/16 1348 219 lb (99.3 kg)     Height 07/20/16 1348 5\' 1"  (1.549 m)     Head Circumference --      Peak Flow --      Pain Score 07/20/16 1417 7     Pain Loc --      Pain Edu? --      Excl. in [place]? --     General: Awake , Alert , and Oriented times 3; GCS 15 Head: Normal cephalic , atraumatic Eyes: Pupils equal , round, reactive to light Nose/Throat: No nasal drainage, patent upper airway without erythema or exudate.  Neck: Supple, Full range of motion, No anterior adenopathy or palpable thyroid masses Lungs: Clear to ascultation without wheezes , rhonchi, or rales Heart: Regular rate, regular rhythm without murmurs , gallops , or rubs Abdomen: Tenderness to deep palpation across the epigastric area without rebound, guarding or rigidity. Bowel sounds are positive and symmetric in all 4 quadrants. There is no reproducible pain over the lower abdominal region. The colostomy site shows some drainage in the bag.       Extremities: 2 plus symmetric pulses. No edema, clubbing or cyanosis Neurologic: normal ambulation, Motor symmetric without deficits, sensory intact Skin: warm, dry, no rashes   Labs:   All laboratory work was reviewed including any pertinent negatives or positives listed below:  Labs Reviewed  COMPREHENSIVE METABOLIC PANEL - Abnormal; Notable for the following:       Result Value   CO2 21 (*)    Glucose, Bld 132 (*)    AST 49 (*)    ALT 72 (*)    All other components within normal limits  CBC - Abnormal; Notable for the following:    WBC 15.1 (*)    RDW 14.7 (*)    All other  components within normal limits  LIPASE, BLOOD  URINALYSIS COMPLETEWITH MICROSCOPIC [facility] ONLY)  Radiology: * "Ct Abdomen Pelvis W Contrast  Result Date: 07/09/2016 CLINICAL DATA:  Fever and nausea with epigastric pain for 4 days. History of previous rectal carcinoma EXAM: CT ABDOMEN AND PELVIS WITH CONTRAST TECHNIQUE: Multidetector CT imaging of the abdomen and pelvis was performed using the standard protocol following bolus administration of intravenous contrast. Oral contrast was also administered. CONTRAST:  149mL ISOVUE-300 IOPAMIDOL (ISOVUE-300) INJECTION 61% COMPARISON:  October 16, 2015 FINDINGS: Lower chest: Lung bases are clear. Hepatobiliary: There is hepatic steatosis. No focal liver lesions are evident. Gallbladder wall appears mildly thickened and edematous. There is no biliary duct dilatation. Pancreas: There is  no pancreatic mass or inflammatory focus. Spleen: No splenic lesions are evident. Adrenals/Urinary Tract: Adrenals appear normal bilaterally. Kidneys bilaterally show no mass or hydronephrosis on either side. There is no renal or ureteral calculus on either side. Urinary bladder is decompressed without wall thickening. Stomach/Bowel: The patient has had a distal colectomy with a left lower quadrant sigmoid end colostomy. There is herniation of fat into the area of the ostomy, also present on previous study without significant change. No bowel compromise is seen in this area. There is no bowel wall or mesenteric thickening. No bowel obstruction. No evidence of free air or portal venous air. Vascular/Lymphatic: There is no abdominal aortic aneurysm. No vascular lesion is evident. There are scattered stable appearing subcentimeter lymph nodes in the mesentery. By size criteria, there is no adenopathy in the abdomen or pelvis. Reproductive: Uterus is absent.  No pelvic mass evident. Other: There is less presacral soft tissue thickening compared to prior study. There is no inflammation or  evidence of mass in the presacral region in the area of previous rectal removal. There is no peri appendiceal region inflammation. No abscess or ascites evident in the abdomen or pelvis. There is a moderate umbilical level ventral hernia which contains fat but no bowel. A smaller hernia in the midline superior to the umbilical hernia contains only fat and is stable. Musculoskeletal: There are no blastic or lytic bone lesions. No intramuscular lesions are evident. IMPRESSION: Gallbladder wall appears somewhat edematous. Question early cholecystitis. Correlation with ultrasound of the gallbladder may be advisable. Status post low anterior resection. There is scarring but no inflammation or evidence of mass in the presacral/perirectal region. No bowel obstruction.  No abscess in the abdomen or pelvis. Ventral hernias remain stable, containing fat but no bowel. Left lower quadrant ostomy with fairly extensive herniation of omental fat into the ostomy site. No bowel compromise in this area. Hepatic steatosis without focal liver lesion. No renal or ureteral calculus.  No hydronephrosis. Stable small mesenteric lymph nodes without frank adenopathy by size criteria, stable. These results will be called to the ordering clinician or representative by the Radiologist Assistant, and communication documented in the PACS or zVision Dashboard. Electronically Signed   By: Lowella Grip III M.D.   On: 07/09/2016 16:14   US Abdomen Limited Ruq  Result Date: 07/20/2016 CLINICAL DATA:  Epigastric pain. EXAM: US ABDOMEN LIMITED - RIGHT UPPER QUADRANT COMPARISON:  07/09/2016 FINDINGS: Gallbladder: Within the gallbladder there is a 1.9 cm stone in the region of the gallbladder neck, non mobile on repositioning. Gallbladder wall is 5.0 mm. No sonographic Murphy's sign. Sludge is present within the gallbladder. No pericholecystic fluid. Common bile duct: Diameter: 2.1 mm Liver: The liver is echogenic. There is attenuation of the  ultrasound wave, poor visualization of the internal hepatic architecture, and loss of definition of the diaphragm. No focal liver lesions are identified. IMPRESSION: Thickened gallbladder wall and gallstone within the gallbladder neck. Increased gallbladder wall thickening since the prior study suspicious for acute cholecystitis. Hepatic steatosis. Electronically Signed   By: Nolon Nations M.D.   On: 07/20/2016 17:32   US Abdomen Limited Ruq  Result Date: 07/09/2016 CLINICAL DATA:  Right upper quadrant abdominal pain. EXAM: US ABDOMEN LIMITED - RIGHT UPPER QUADRANT COMPARISON:  CT abdomen/ pelvis earlier today. FINDINGS: Gallbladder: Physiologically distended. Multiple intraluminal gallstones. Stone in the gallbladder neck is non mobile. Borderline wall thickening of 3 mm. No sonographic Murphy sign noted by sonographer. No pericholecystic fluid Common bile duct: Diameter:  2 mm. Liver: No focal lesion identified. Diffusely increased in parenchymal echogenicity. Normal directional flow in the main portal vein. IMPRESSION: 1. Cholelithiasis with non mobile stone in the gallbladder neck. Borderline wall thickening of 3 mm. Negative sonographic Murphy sign, no pericholecystic fluid. Nuclear medicine HIDA scan may be helpful for further evaluation. 2. Hepatic steatosis. Electronically Signed   By: Jeb Levering M.D.   On: 07/09/2016 23:27  "  I personally reviewed the radiologic studies     ED Course: * Patient's stay here overall was uneventful though she started having return of her discomfort after receiving her ultrasound results. Reviewed the case with general surgery Dr. Antionette Char agreed to see and evaluate the patient. Further disposition and management is per his evaluation. Clinical Course     Assessment:  Mild acute cholecystitis   Final Clinical Impression:   Final diagnoses:  Colicky epigastric pain     Plan:  Surgical consultation            Daymon Larsen,  MD 07/20/16 1827

## 2016-07-20 NOTE — Progress Notes (Signed)
Anticoagulation monitoring(Lovenox):  30 yo  female ordered Lovenox 30 mg Q24h  Filed Weights   07/20/16 1348  Weight: 219 lb (99.3 kg)   BMI 40.6   Lab Results  Component Value Date   CREATININE 0.80 07/20/2016   CREATININE 0.68 04/25/2016   CREATININE 0.67 10/12/2015   Estimated Creatinine Clearance: 113.1 mL/min (by C-G formula based on SCr of 0.8 mg/dL). Hemoglobin & Hematocrit     Component Value Date/Time   HGB 12.7 07/20/2016 1420   HGB 12.9 05/08/2014 1019   HCT 38.0 07/20/2016 1420   HCT 38.5 05/08/2014 1019     Per Protocol for Patient with estCrcl > 30 ml/min and BMI > 40, will transition to Lovenox 40 mg Q12h.

## 2016-07-20 NOTE — Assessment & Plan Note (Addendum)
Patient has follow up wit Gen Surg next week. Discussed potential use of HIDA scan to determine need for surgery. Repeat LFTs done today are mildly elevated bu more consistent with fatty liver, not cholestasis .   Lab Results  Component Value Date   ALT 72 (H) 07/20/2016   AST 49 (H) 07/20/2016   ALKPHOS 83 07/20/2016   BILITOT 0.9 07/20/2016

## 2016-07-21 ENCOUNTER — Encounter: Payer: Self-pay | Admitting: Anesthesiology

## 2016-07-21 ENCOUNTER — Inpatient Hospital Stay: Payer: BLUE CROSS/BLUE SHIELD | Admitting: Anesthesiology

## 2016-07-21 ENCOUNTER — Encounter
Admission: EM | Disposition: A | Discharge status: Home / Self Care | Payer: Self-pay | Source: Home / Self Care | Attending: Surgery

## 2016-07-21 DIAGNOSIS — K432 Incisional hernia without obstruction or gangrene: Secondary | ICD-10-CM

## 2016-07-21 HISTORY — PX: CHOLECYSTECTOMY: SHX55

## 2016-07-21 LAB — COMPREHENSIVE METABOLIC PANEL
ALT: 52 U/L (ref 14–54)
AST: 27 U/L (ref 15–41)
Albumin: 3.3 g/dL — ABNORMAL LOW (ref 3.5–5.0)
Alkaline Phosphatase: 73 U/L (ref 38–126)
Anion gap: 5 (ref 5–15)
BUN: 8 mg/dL (ref 6–20)
CO2: 26 mmol/L (ref 22–32)
Calcium: 8.7 mg/dL — ABNORMAL LOW (ref 8.9–10.3)
Chloride: 107 mmol/L (ref 101–111)
Creatinine, Ser: 0.96 mg/dL (ref 0.44–1.00)
GFR calc Af Amer: >60 mL/min (ref >60)
GFR calc non Af Amer: >60 mL/min (ref >60)
Glucose, Bld: 111 mg/dL — ABNORMAL HIGH (ref 65–99)
Potassium: 3.2 mmol/L — ABNORMAL LOW (ref 3.5–5.1)
Sodium: 138 mmol/L (ref 135–145)
Total Bilirubin: 1.2 mg/dL (ref 0.3–1.2)
Total Protein: 6.6 g/dL (ref 6.5–8.1)

## 2016-07-21 LAB — CBC
HCT: 32.6 % — ABNORMAL LOW (ref 35.0–47.0)
Hemoglobin: 11.4 g/dL — ABNORMAL LOW (ref 12.0–16.0)
MCH: 30.9 pg (ref 26.0–34.0)
MCHC: 35 g/dL (ref 32.0–36.0)
MCV: 88.4 fL (ref 80.0–100.0)
Platelets: 159 10*3/uL (ref 150–440)
RBC: 3.69 MIL/uL — ABNORMAL LOW (ref 3.80–5.20)
RDW: 14.7 % — ABNORMAL HIGH (ref 11.5–14.5)
WBC: 9.9 10*3/uL (ref 3.6–11.0)

## 2016-07-21 LAB — BILIRUBIN, DIRECT: Bilirubin, Direct: 0.2 mg/dL (ref 0.1–0.5)

## 2016-07-21 LAB — MRSA PCR SCREENING: MRSA by PCR: NEGATIVE

## 2016-07-21 SURGERY — LAPAROSCOPIC CHOLECYSTECTOMY
Anesthesia: General

## 2016-07-21 MED ORDER — FENTANYL CITRATE (PF) 100 MCG/2ML IJ SOLN
INTRAMUSCULAR | Status: AC
  Filled 2016-07-21: qty 2

## 2016-07-21 MED ORDER — NEOSTIGMINE METHYLSULFATE 10 MG/10ML IV SOLN
INTRAVENOUS | Status: DC | PRN
  Administered 2016-07-21: 4 mg via INTRAVENOUS

## 2016-07-21 MED ORDER — CEFAZOLIN IN D5W 1 GM/50ML IV SOLN
INTRAVENOUS | Status: AC
  Filled 2016-07-21: qty 50

## 2016-07-21 MED ORDER — ACETAMINOPHEN 10 MG/ML IV SOLN
INTRAVENOUS | Status: AC
  Filled 2016-07-21: qty 100

## 2016-07-21 MED ORDER — LACTATED RINGERS IV SOLN
INTRAVENOUS | Status: DC | PRN
  Administered 2016-07-21 (×2): via INTRAVENOUS

## 2016-07-21 MED ORDER — OXYCODONE HCL 5 MG PO TABS: 5.0000 mg | ORAL_TABLET | Freq: Once | ORAL | Status: DC | PRN

## 2016-07-21 MED ORDER — ROCURONIUM BROMIDE 100 MG/10ML IV SOLN
INTRAVENOUS | Status: DC | PRN
  Administered 2016-07-21 (×3): 10 mg via INTRAVENOUS
  Administered 2016-07-21: 40 mg via INTRAVENOUS

## 2016-07-21 MED ORDER — ACETAMINOPHEN 10 MG/ML IV SOLN
INTRAVENOUS | Status: DC | PRN
  Administered 2016-07-21: 1000 mg via INTRAVENOUS

## 2016-07-21 MED ORDER — GLYCOPYRROLATE 0.2 MG/ML IJ SOLN
INTRAMUSCULAR | Status: DC | PRN
  Administered 2016-07-21: .6 mg via INTRAVENOUS

## 2016-07-21 MED ORDER — OXYCODONE HCL 5 MG PO TABS
5.0000 mg | ORAL_TABLET | ORAL | Status: DC | PRN
  Administered 2016-07-21 – 2016-07-23 (×7): 5 mg via ORAL
  Filled 2016-07-21 (×7): qty 1

## 2016-07-21 MED ORDER — BUPIVACAINE-EPINEPHRINE (PF) 0.25% -1:200000 IJ SOLN
INTRAMUSCULAR | Status: AC
  Filled 2016-07-21: qty 30

## 2016-07-21 MED ORDER — FENTANYL CITRATE (PF) 100 MCG/2ML IJ SOLN
25.0000 ug | INTRAMUSCULAR | Status: DC | PRN
  Administered 2016-07-21 (×3): 50 ug via INTRAVENOUS

## 2016-07-21 MED ORDER — OXYCODONE HCL 5 MG/5ML PO SOLN: 5.0000 mg | Freq: Once | ORAL | Status: DC | PRN

## 2016-07-21 MED ORDER — CEFOXITIN SODIUM 2 G IV SOLR
2.0000 g | Freq: Three times a day (TID) | INTRAVENOUS | Status: DC
  Administered 2016-07-21 – 2016-07-23 (×5): 2 g via INTRAVENOUS
  Filled 2016-07-21 (×7): qty 2

## 2016-07-21 MED ORDER — DEXAMETHASONE SODIUM PHOSPHATE 10 MG/ML IJ SOLN
INTRAMUSCULAR | Status: DC | PRN
  Administered 2016-07-21: 8 mg via INTRAVENOUS

## 2016-07-21 MED ORDER — FENTANYL CITRATE (PF) 100 MCG/2ML IJ SOLN
INTRAMUSCULAR | Status: DC | PRN
  Administered 2016-07-21 (×4): 50 ug via INTRAVENOUS
  Administered 2016-07-21: 100 ug via INTRAVENOUS

## 2016-07-21 MED ORDER — LACTATED RINGERS IV SOLN
INTRAVENOUS | Status: DC
  Administered 2016-07-21 – 2016-07-22 (×3): via INTRAVENOUS

## 2016-07-21 MED ORDER — LIDOCAINE HCL (CARDIAC) 20 MG/ML IV SOLN
INTRAVENOUS | Status: DC | PRN
  Administered 2016-07-21: 100 mg via INTRAVENOUS

## 2016-07-21 MED ORDER — MIDAZOLAM HCL 2 MG/2ML IJ SOLN
INTRAMUSCULAR | Status: DC | PRN
  Administered 2016-07-21: 2 mg via INTRAVENOUS

## 2016-07-21 MED ORDER — BUPIVACAINE HCL (PF) 0.5 % IJ SOLN
INTRAMUSCULAR | Status: AC
  Filled 2016-07-21: qty 30

## 2016-07-21 MED ORDER — PROPOFOL 10 MG/ML IV BOLUS
INTRAVENOUS | Status: DC | PRN
  Administered 2016-07-21: 200 mg via INTRAVENOUS

## 2016-07-21 MED ORDER — LIDOCAINE HCL (PF) 1 % IJ SOLN
INTRAMUSCULAR | Status: AC
  Filled 2016-07-21: qty 30

## 2016-07-21 SURGICAL SUPPLY — 53 items
APPLIER CLIP 5 13 M/L LIGAMAX5 (MISCELLANEOUS)
APR CLP MED LRG 5 ANG JAW (MISCELLANEOUS)
BLADE SURG 15 STRL LF DISP TIS (BLADE) ×1 IMPLANT
BLADE SURG 15 STRL SS (BLADE) ×2
BULB RESERV EVAC DRAIN JP 100C (MISCELLANEOUS) ×1 IMPLANT
CANISTER SUCT 1200ML W/VALVE (MISCELLANEOUS) ×2 IMPLANT
CATH CHOLANGI 4FR 420404F (CATHETERS) IMPLANT
CHLORAPREP W/TINT 10.5 ML (MISCELLANEOUS) ×2 IMPLANT
CLIP APPLIE 5 13 M/L LIGAMAX5 (MISCELLANEOUS) ×1 IMPLANT
CONRAY 60ML FOR OR (MISCELLANEOUS) ×1 IMPLANT
DRAIN CHANNEL JP 19F (MISCELLANEOUS) ×1 IMPLANT
DRAPE C-ARM XRAY 36X54 (DRAPES) IMPLANT
DRSG TEGADERM 4X4.75 (GAUZE/BANDAGES/DRESSINGS) ×3 IMPLANT
ELECT CAUTERY BLADE 6.4 (BLADE) ×1 IMPLANT
ELECT REM PT RETURN 9FT ADLT (ELECTROSURGICAL) ×2
ELECTRODE REM PT RTRN 9FT ADLT (ELECTROSURGICAL) ×1 IMPLANT
ENDOPOUCH RETRIEVER 10 (MISCELLANEOUS) ×1 IMPLANT
GAUZE SPONGE 4X4 12PLY STRL (GAUZE/BANDAGES/DRESSINGS) ×1 IMPLANT
GLOVE SURG SYN 7.0 (GLOVE) ×2 IMPLANT
GLOVE SURG SYN 7.0 PF PI (GLOVE) ×1 IMPLANT
GLOVE SURG SYN 7.5  E (GLOVE) ×1
GLOVE SURG SYN 7.5 E (GLOVE) ×1 IMPLANT
GLOVE SURG SYN 7.5 PF PI (GLOVE) ×1 IMPLANT
GOWN STRL REUS W/ TWL LRG LVL3 (GOWN DISPOSABLE) ×2 IMPLANT
GOWN STRL REUS W/TWL LRG LVL3 (GOWN DISPOSABLE) ×4
IRRIGATION STRYKERFLOW (MISCELLANEOUS) IMPLANT
IRRIGATOR STRYKERFLOW (MISCELLANEOUS)
IV CATH ANGIO 12GX3 LT BLUE (NEEDLE) ×1 IMPLANT
IV NS 1000ML (IV SOLUTION)
IV NS 1000ML BAXH (IV SOLUTION) IMPLANT
JACKSON PRATT 10 (INSTRUMENTS) IMPLANT
L-HOOK LAP DISP 36CM (ELECTROSURGICAL)
LABEL OR SOLS (LABEL) ×1 IMPLANT
LHOOK LAP DISP 36CM (ELECTROSURGICAL) ×1 IMPLANT
LIQUID BAND (GAUZE/BANDAGES/DRESSINGS) ×2 IMPLANT
NDL HPO THNWL 1X22GA REG BVL (NEEDLE) ×1 IMPLANT
NDL SAFETY 22GX1.5 (NEEDLE) ×2 IMPLANT
NEEDLE SAFETY 22GX1 (NEEDLE) ×2
PACK LAP CHOLECYSTECTOMY (MISCELLANEOUS) ×2 IMPLANT
PENCIL ELECTRO HAND CTR (MISCELLANEOUS) ×1 IMPLANT
SCISSORS METZENBAUM CVD 33 (INSTRUMENTS) ×2 IMPLANT
SLEEVE ADV FIXATION 5X100MM (TROCAR) ×6 IMPLANT
SPONGE EXCIL AMD DRAIN 4X4 6P (MISCELLANEOUS) IMPLANT
STAPLER SKIN PROX 35W (STAPLE) ×1 IMPLANT
SUT MNCRL 4-0 (SUTURE) ×2
SUT MNCRL 4-0 27XMFL (SUTURE) ×1
SUT VIC AB 0 CT1 36 (SUTURE) ×1 IMPLANT
SUT VICRYL 0 AB UR-6 (SUTURE) ×2 IMPLANT
SUTURE MNCRL 4-0 27XMF (SUTURE) ×1 IMPLANT
TROCAR 130MM GELPORT  DAV (MISCELLANEOUS) ×1 IMPLANT
TROCAR BLUNT TIP 12MM OMST12BT (TROCAR) ×2 IMPLANT
TROCAR Z-THREAD OPTICAL 5X100M (TROCAR) ×2 IMPLANT
TUBING INSUFFLATOR HI FLOW (MISCELLANEOUS) ×2 IMPLANT

## 2016-07-21 NOTE — Transfer of Care (Signed)
Immediate Anesthesia Transfer of Care Note  Patient: Rachael Cowan  Procedure(s) Performed: Procedure(s): LAPAROSCOPIC CHOLECYSTECTOMY CONVERTED TO OPEN (N/A)  Patient Location: PACU  Anesthesia Type:General  Level of Consciousness: sedated  Airway & Oxygen Therapy: Patient Spontanous Breathing and Patient connected to face mask oxygen  Post-op Assessment: Report given to RN and Post -op Vital signs reviewed and stable  Post vital signs: Reviewed and stable  Last Vitals:  Vitals:   07/21/16 1054 07/21/16 1522  BP: 122/74 (!) 105/59  Pulse: (!) 109 (!) 106  Resp: 20 (!) 24  Temp: (!) 38.2 C 37.2 C    Last Pain:  Vitals:   07/21/16 1054  TempSrc: Temporal  PainSc:       Patients Stated Pain Goal: 0 ([id number])  Complications: No apparent anesthesia complications

## 2016-07-21 NOTE — Anesthesia Preprocedure Evaluation (Addendum)
Anesthesia Evaluation  Patient identified by MRN, date of birth, ID band Patient awake    Reviewed: Allergy & Precautions, H&P , NPO status , Patient's Chart, lab work & pertinent test results  History of Anesthesia Complications Negative for: history of anesthetic complications  Airway Mallampati: II  TM Distance: >3 FB Neck ROM: full    Dental  (+) Poor Dentition, Chipped   Pulmonary neg shortness of breath, asthma , sleep apnea ,    Pulmonary exam normal breath sounds clear to auscultation       Cardiovascular Exercise Tolerance: Good (-) angina(-) Past MI and (-) DOE negative cardio ROS Normal cardiovascular exam Rhythm:regular Rate:Normal     Neuro/Psych negative neurological ROS  negative psych ROS   GI/Hepatic Neg liver ROS, GERD  Controlled,  Endo/Other  negative endocrine ROS  Renal/GU      Musculoskeletal   Abdominal   Peds  Hematology negative hematology ROS (+)   Anesthesia Other Findings Past Medical History: No date: Allergy No date: Asthma 2005: Hypersomnolence disorder     Comment: managed with metidate 05/13/2012: Rectal cancer ([facility])     Comment: Resection with colostomy and hysterectomy. 2005: Sleep apnea     Comment: resolved with ENT surgery,  [name]  Past Surgical History: No date: ABDOMINAL HYSTERECTOMY 2006: ARTHROSCOPIC REPAIR ACL     Comment: right knee Dr Donna Christen at [facility] No date: COLON SURGERY 12/14/2015: COLONOSCOPY WITH PROPOFOL N/A     Comment: Procedure: COLONOSCOPY WITH PROPOFOL;                Surgeon: Manya Silvas, MD;  Location: [place];  Service: Endoscopy;  Laterality:               N/A; No date: Colostomy Placement No date: TONSILLECTOMY AND ADENOIDECTOMY  BMI    Body Mass Index:  41.38 kg/m      Reproductive/Obstetrics negative OB ROS                            Anesthesia Physical Anesthesia  Plan  ASA: III  Anesthesia Plan: General ETT   Post-op Pain Management:    Induction:   Airway Management Planned:   Additional Equipment:   Intra-op Plan:   Post-operative Plan:   Informed Consent: I have reviewed the patients History and Physical, chart, labs and discussed the procedure including the risks, benefits and alternatives for the proposed anesthesia with the patient or authorized representative who has indicated his/her understanding and acceptance.     Plan Discussed with: Anesthesiologist, CRNA and Surgeon  Anesthesia Plan Comments:         Anesthesia Quick Evaluation

## 2016-07-21 NOTE — Interval H&P Note (Signed)
History and Physical Interval Note:  07/21/2016 12:44 PM  Rachael Cowan  has presented today for surgery, with the diagnosis of n/a  The various methods of treatment have been discussed with the patient and family. After consideration of risks, benefits and other options for treatment, the patient has consented to  Procedure(s): LAPAROSCOPIC CHOLECYSTECTOMY (N/A) as a surgical intervention .  The patient's history has been reviewed, patient examined, no change in status, stable for surgery.  I have reviewed the patient's chart and labs.  Questions were answered to the patient's satisfaction.     Reshanda Lewey

## 2016-07-21 NOTE — Progress Notes (Signed)
Patient signed consents.  MRSA PCR completed.  Pt sent to the OR via bed by OR orderly

## 2016-07-21 NOTE — Anesthesia Procedure Notes (Signed)
Procedure Name: Intubation Date/Time: 07/21/2016 12:41 PM Performed by: Darlyne Russian Pre-anesthesia Checklist: Patient identified, Emergency Drugs available, Suction available, Patient being monitored and Timeout performed Patient Re-evaluated:Patient Re-evaluated prior to inductionOxygen Delivery Method: Circle system utilized Preoxygenation: Pre-oxygenation with 100% oxygen Intubation Type: IV induction Ventilation: Mask ventilation without difficulty Laryngoscope Size: Mac and 3 Grade View: Grade I Tube type: Oral Tube size: 7.0 mm Number of attempts: 1 Airway Equipment and Method: Stylet Placement Confirmation: ETT inserted through vocal cords under direct vision,  positive ETCO2 and breath sounds checked- equal and bilateral Secured at: 22 (at teeth) cm Tube secured with: Tape Dental Injury: Teeth and Oropharynx as per pre-operative assessment

## 2016-07-21 NOTE — Op Note (Signed)
Procedure Date:  07/21/2016  Pre-operative Diagnosis:  Acute cholecystitis  Post-operative Diagnosis: Acute cholecystitis and incisional hernia  Procedure:  Laparoscopic converted to open cholecystectomy and incisional hernia repair.  Surgeon:  Melvyn Neth, MD  Anesthesia:  General endotracheal  Estimated Blood Loss:  30 ml  Specimens:  gallbladder  Complications:  None  Indications for Procedure:  This is a 30 y.o. female who presents with abdominal pain and workup revealing acute cholecystitis.  The benefits, complications, treatment options, and expected outcomes were discussed with the patient. The risks of bleeding, infection, recurrence of symptoms, failure to resolve symptoms, bile duct damage, bile duct leak, retained common bile duct stone, bowel injury, and need for further procedures were all discussed with the patient and she was willing to proceed.  Description of Procedure: The patient was correctly identified in the preoperative area and brought into the operating room.  The patient was placed supine with VTE prophylaxis in place.  Appropriate time-outs were performed.  Anesthesia was induced and the patient was intubated.  Appropriate antibiotics were infused.  The abdomen was prepped and draped in a sterile fashion, keeping the patient's LLQ ostomy out of the sterile field. A supra-umbilical incision was made. A cutdown technique was used to enter the abdominal cavity revealing a 1-inch incisional hernia over the superior portion of the patient's prior midline incision.  A Hasson trocar was inserted through the hernia defect. Pneumoperitoneum was obtained with appropriate opening pressures.  A 10 mm 30 degree scope was inserted and it was noted that the patient had significant adhesions of the omentum to the anterior abdominal wall.  The RUQ and gallbladder were not able to be identified via this trocar due to the adhesions.  A 5-mm optivue port was placed in the  subxiphoid area and a 5 mm 30 degree scope was inserted and this revealed significant adhesions in the mid-abdomen and the RUQ, completely blocking access to the gallbladder.  Decision was then made to convert to an open cholecystectomy.  A 10 cm right subcostal incision was made and Bovie was used to dissect down to the fascia and through the muscular layers.  The abdominal cavity was entered without injury.  The inferior edge of the liver was identified with omentum adhered to it.  These adhesions were taken down bluntly, revealing the gallbladder.  The gallbladder was identified and found to be acutely inflammed and distended, and it was decompressed with a needle, which aspirated some purulent fluid.  The fundus was grasped and retracted caudally and dissected off the liver in retrograde approach down to the infundibulum.  Any bleeding was cauterized.  The cystic duct and artery were then carefully dissected and the artery was doubly clipped and cut in between clips and the duct was ligated x 3 and cut.  The gallbladder was taken out of the field and sent to pathology.  The liver bed was inspected and hemostasis assured with cautery.  The distal stomach and proximal bowel were examined and there were no injuries.  A 19Fr Blake drain was then placed via a RUQ incision over the liver bed and around the cystic artery and duct remnants.  The RUQ was then irrigated thoroughly.  The abdomen was then closed using two #1 PDS sutures.  The incisional hernia was further dissected to identify the fascial edges.  The defect was closed using running 0- vicryl.  The RUQ and supra-umbilical wounds were then irrigated and closed with staples.  All incisions were dressed  sterilely with gauze and tegaderm.  The patient was emerged from anesthesia and extubated and brought to the recovery room for further management.  The patient tolerated the procedure well and all counts were correct at the end of the case.   Melvyn Neth, MD

## 2016-07-21 NOTE — Progress Notes (Signed)
07/21/2016  Subjective: No acute events overnight. Patient reports that her pain is better controlled with pain medications. Denies any nausea. Tolerated clears last night but has been nothing by mouth since midnight for possible surgery today.  Vital signs: Temp:  [98.5 F (36.9 C)-99.6 F (37.6 C)] 98.9 F (37.2 C) (10/23 0420) Pulse Rate:  [90-110] 90 (10/23 0420) Resp:  [15-20] 20 (10/23 0420) BP: (95-121)/(48-99) 95/48 (10/23 0420) SpO2:  [98 %-100 %] 98 % (10/23 0420) Weight:  [99.3 kg (219 lb)] 99.3 kg (219 lb) (10/22 1348)   Intake/Output: 10/22 0701 - 10/23 0700 In: 512 [I.V.:512] Out: -  Last BM Date: 07/20/16  Physical Exam: Constitutional: Acute distress Abdomen: Soft, nondistended, with tenderness to palpation in the right upper quadrant. Patient's left lower quadrant ostomy viable with stool in the bag. Patient has a low midline scar that is well-healed  Labs:   Recent Labs  07/20/16 1420 07/21/16 0522  WBC 15.1* 9.9  HGB 12.7 11.4*  HCT 38.0 32.6*  PLT 212 159    Recent Labs  07/20/16 1420 07/21/16 0522  NA 136 138  K 3.5 3.2*  CL 104 107  CO2 21* 26  GLUCOSE 132* 111*  BUN 9 8  CREATININE 0.80 0.96  CALCIUM 9.2 8.7*   No results for input(s): LABPROT, INR in the last 72 hours.  Imaging: US Abdomen Limited Ruq  Result Date: 07/20/2016 CLINICAL DATA:  Epigastric pain. EXAM: US ABDOMEN LIMITED - RIGHT UPPER QUADRANT COMPARISON:  07/09/2016 FINDINGS: Gallbladder: Within the gallbladder there is a 1.9 cm stone in the region of the gallbladder neck, non mobile on repositioning. Gallbladder wall is 5.0 mm. No sonographic Murphy's sign. Sludge is present within the gallbladder. No pericholecystic fluid. Common bile duct: Diameter: 2.1 mm Liver: The liver is echogenic. There is attenuation of the ultrasound wave, poor visualization of the internal hepatic architecture, and loss of definition of the diaphragm. No focal liver lesions are identified.  IMPRESSION: Thickened gallbladder wall and gallstone within the gallbladder neck. Increased gallbladder wall thickening since the prior study suspicious for acute cholecystitis. Hepatic steatosis. Electronically Signed   By: Nolon Nations M.D.   On: 07/20/2016 17:32    Assessment/Plan: 31 year old female with acute cholecystitis. Her LFTs are within normal limits and her direct bilirubin is low less suspicious for any potential choledocholithiasis. Have explained to the patient that we will proceed today with laparoscopic cholecystectomy although she does know the potential risk of having to convert to an open procedure due to her prior surgeries and possibility of multiple adhesions and scarring. Risks and benefits have been explained to the patient she was willing to proceed. we will book her for today.   Melvyn Neth, [place]

## 2016-07-22 ENCOUNTER — Encounter: Payer: Self-pay | Admitting: Surgery

## 2016-07-22 NOTE — Anesthesia Postprocedure Evaluation (Addendum)
Anesthesia Post Note  Patient: Rachael Cowan  Procedure(s) Performed: Procedure(s) (LRB): LAPAROSCOPIC CHOLECYSTECTOMY CONVERTED TO OPEN (N/A)  Patient location during evaluation: PACU Anesthesia Type: General Level of consciousness: awake and alert Pain management: pain level controlled Vital Signs Assessment: post-procedure vital signs reviewed and stable Respiratory status: spontaneous breathing, nonlabored ventilation, respiratory function stable and patient connected to nasal cannula oxygen Cardiovascular status: blood pressure returned to baseline and stable Postop Assessment: no signs of nausea or vomiting Anesthetic complications: no    Last Vitals:  Vitals:   07/22/16 0530 07/22/16 1210  BP: (!) 94/44 99/69  Pulse: 75 77  Resp: 20 19  Temp: 36.6 C 36.7 C    Last Pain:  Vitals:   07/22/16 1518  TempSrc:   PainSc: 2                  Precious Haws Denzell Colasanti

## 2016-07-22 NOTE — Progress Notes (Signed)
07/22/2016  Subjective: Patient is 1 Day Post-Op status post laparoscopic converted to open cholecystectomy and incisional hernia repair. No acute events overnight. Patient tolerated clears to yesterday. Her pain is well-controlled with IV and oral pain medication. Denies any nausea, vomiting, fevers, worsening abdominal pain.  Vital signs: Temp:  [97.9 F (36.6 C)-101.9 F (38.8 C)] 97.9 F (36.6 C) (10/24 0530) Pulse Rate:  [75-109] 75 (10/24 0530) Resp:  [16-24] 20 (10/24 0530) BP: (92-122)/(44-74) 94/44 (10/24 0530) SpO2:  [90 %-97 %] 93 % (10/24 0530) Weight:  [99.3 kg (219 lb)] 99.3 kg (219 lb) (10/23 1100)   Intake/Output: 10/23 0701 - 10/24 0700 In: 5417.8 [P.O.:1000; I.V.:4337.8; IV Piggyback:50] Out: [id number] [Drains:37; Blood:300] Last BM Date: 07/20/16  Physical Exam: Constitutional: No acute distress Abdomen: Soft, nondistended, appropriately tender to palpation. Her incisions are clean dry and intact with staples and gauze dressing on top. Her JP drain in the right upper quadrant is draining serosanguineous fluid. Left lower quadrant ostomy healthy with stool in the bag.  Labs:   Recent Labs  07/20/16 1420 07/21/16 0522  WBC 15.1* 9.9  HGB 12.7 11.4*  HCT 38.0 32.6*  PLT 212 159    Recent Labs  07/20/16 1420 07/21/16 0522  NA 136 138  K 3.5 3.2*  CL 104 107  CO2 21* 26  GLUCOSE 132* 111*  BUN 9 8  CREATININE 0.80 0.96  CALCIUM 9.2 8.7*   No results for input(s): LABPROT, INR in the last 72 hours.  Imaging: No results found.  Assessment/Plan: 30 year old female status post laparoscopic converted to open cholecystectomy and incisional hernia repair.  -We'll transition the patient to a regular diet today and discontinue her IV fluids.  -Given that her gallbladder had purulent fluid inside we'll continue antibiotics for now. We'll transition the patient to oral antibiotics prior to discharge. -Continue JP drain today and likely will remove  tomorrow. -Possible discharge to home tomorrow.   Melvyn Neth, [place]

## 2016-07-23 ENCOUNTER — Ambulatory Visit: Payer: BLUE CROSS/BLUE SHIELD | Admitting: Surgery

## 2016-07-23 LAB — SURGICAL PATHOLOGY

## 2016-07-23 MED ORDER — AMOXICILLIN-POT CLAVULANATE 875-125 MG PO TABS
1.0000 | ORAL_TABLET | Freq: Two times a day (BID) | ORAL | Status: DC
  Administered 2016-07-23: 1 via ORAL
  Filled 2016-07-23: qty 1

## 2016-07-23 MED ORDER — OXYCODONE HCL 5 MG PO TABS: 5.0000 mg | ORAL_TABLET | ORAL | 0 refills | Status: DC | PRN

## 2016-07-23 MED ORDER — AMOXICILLIN-POT CLAVULANATE 875-125 MG PO TABS: 1.0000 | ORAL_TABLET | Freq: Two times a day (BID) | ORAL | 0 refills | Status: DC

## 2016-07-23 MED ORDER — IBUPROFEN 600 MG PO TABS: 600.0000 mg | ORAL_TABLET | Freq: Three times a day (TID) | ORAL | 0 refills | Status: DC | PRN

## 2016-07-23 NOTE — Progress Notes (Signed)
07/23/2016  Subjective: Patient is 2 Days Post-Op status post laparoscopic converted to open cholecystectomy and incisional hernia repair. Patient has been tolerating her regular diet with no issues no nausea or vomiting. Her pain is well-controlled and she has been ambulating.  Vital signs: Temp:  [98.1 F (36.7 C)-98.3 F (36.8 C)] 98.1 F (36.7 C) (10/25 0446) Pulse Rate:  [80-83] 80 (10/25 0446) Resp:  [18] 18 (10/24 2033) BP: (97-113)/(59-65) 113/65 (10/25 0446) SpO2:  [94 %-97 %] 94 % (10/25 0446)   Intake/Output: 10/24 0701 - 10/25 0700 In: 240 [P.O.:240] Out: 10 [Drains:10] Last BM Date: 07/20/16  Physical Exam: Constitutional: No acute distress Abdomen: Soft, nondistended, properly tender to palpation. Patient's incisions are clean dry and intact with staples. The left lower quadrant ostomy is viable with stool in the bag. Patient has a JP drain in place with serosanguineous fluid.  Labs:   Recent Labs  07/20/16 1420 07/21/16 0522  WBC 15.1* 9.9  HGB 12.7 11.4*  HCT 38.0 32.6*  PLT 212 159    Recent Labs  07/20/16 1420 07/21/16 0522  NA 136 138  K 3.5 3.2*  CL 104 107  CO2 21* 26  GLUCOSE 132* 111*  BUN 9 8  CREATININE 0.80 0.96  CALCIUM 9.2 8.7*   No results for input(s): LABPROT, INR in the last 72 hours.  Imaging: No results found.  Assessment/Plan: 30 year old female status post laparoscopic converted to open cholecystectomy and incisional hernia repair.  -Patient will be discharged to home today. -JP will be removed prior to discharge. -Patient will be on a 7 day course of Augmentin. All discharge instructions have been explained to the patient and she understands them.   Melvyn Neth, [place]

## 2016-07-23 NOTE — Progress Notes (Signed)
Pt to be discharged per MD order. Iv removed. JP removed per MD order. Instructions reviewed with pt and family. All questions answered. Scripts given to pt. Will be taken out in wheelchair.

## 2016-07-23 NOTE — Discharge Summary (Signed)
Patient ID: Rachael Cowan MRN: [id number] DOB/AGE: [birth year] 30 y.o.  Admit date: 07/20/2016 Discharge date: 07/23/2016   Discharge Diagnoses:  Active Problems:   Acute cholecystitis   Incisional hernia, without obstruction or gangrene   Procedures: Laparoscopic converted to open cholecystectomy and incisional hernia repair.  Hospital Course: Patient was admitted on 10/22 with acute cholecystitis. She was taken to the operating room on 10/23 and underwent a laparoscopic converted to open cholecystectomy and incisional hernia repair. A JP drain was left in the gallbladder fossa. Her gallbladder had purulent fluid inside and she was kept on IV antibiotics and transitioned to oral antibiotics by postoperative day 2. On postop day 1 she was started on a clear liquid diet and advance slowly as tolerated. Her JP drain remained serosanguineous throughout her hospital stay. Her pain was well-controlled with IV and oral pain medication and transitioned to only oral eventually. She was ambulating having bowel function voiding with no complications in her pain was well controlled. She was deemed ready for discharge on postop day 2. JP drain was removed with no complications.  Consults: None  Disposition: 01-Home or Self Care  Discharge Instructions    Call MD for:  difficulty breathing, headache or visual disturbances    Complete by:  As directed    Call MD for:  persistant nausea and vomiting    Complete by:  As directed    Call MD for:  redness, tenderness, or signs of infection (pain, swelling, redness, odor or green/yellow discharge around incision site)    Complete by:  As directed    Call MD for:  severe uncontrolled pain    Complete by:  As directed    Call MD for:  temperature >100.4    Complete by:  As directed    Diet - low sodium heart healthy    Complete by:  As directed    Discharge instructions    Complete by:  As directed    Patient may shower. Do not scrub wounds heavily and  dab dry only. Do not submerge the wounds in pool or tub.   Driving Restrictions    Complete by:  As directed    Do not drive while taking narcotics for pain control.   Increase activity slowly    Complete by:  As directed    Lifting restrictions    Complete by:  As directed    No heavy lifting of more than 10 pounds for 4 weeks.   No dressing needed    Complete by:  As directed    May apply dry gauze dressing over incisions. However no dressing is necessary.       Medication List    TAKE these medications   amoxicillin-clavulanate 875-125 MG tablet Commonly known as:  AUGMENTIN Take 1 tablet by mouth every 12 (twelve) hours.   cyanocobalamin 500 MCG tablet Take 500 mcg by mouth 2 (two) times daily.   estrogens (conjugated) 0.9 MG tablet Commonly known as:  PREMARIN Take 1 tablet (0.9 mg total) by mouth daily.   ibuprofen 600 MG tablet Commonly known as:  ADVIL,MOTRIN Take 1 tablet (600 mg total) by mouth every 8 (eight) hours as needed for fever, mild pain or moderate pain.   loratadine 10 MG tablet Commonly known as:  CLARITIN Take 10 mg by mouth daily.   methylphenidate 30 MG CR capsule Commonly known as:  METADATE CD Take 30 mg by mouth 2 (two) times daily. In the morning and in the afternoon  montelukast 10 MG tablet Commonly known as:  SINGULAIR Take 10 mg by mouth every morning.   multivitamin Tabs tablet Take 1 tablet by mouth daily.   omega-3 acid ethyl esters 1 g capsule Commonly known as:  LOVAZA Take 1 g by mouth 2 (two) times daily.   oxyCODONE 5 MG immediate release tablet Commonly known as:  Oxy IR/ROXICODONE Take 1-2 tablets (5-10 mg total) by mouth every 4 (four) hours as needed for moderate pain or severe pain.   pantoprazole 40 MG tablet Commonly known as:  PROTONIX Take 40 mg by mouth daily.      Follow-up [place], MD Follow up in 1 week(s).   Specialty:  Surgery Contact information: [address]  [address] [place] [address] [phone]

## 2016-07-28 ENCOUNTER — Telehealth: Payer: Self-pay

## 2016-07-28 NOTE — Telephone Encounter (Signed)
Patient called to say she has broke out in a rash since Friday. She has a rash across her abdomen and under her breast. She started the Amoxicillin on Wednesday. She was instructed to stop the Amoxicillin and take some benadryl for the rash.    I placed a call to the [place] office to speak with Amber as to changing her antibiotic. Per Dr.Woodham no need to restart on an Antibiotic at this time. Patient to continue with Benadryl. Patient has an appointment on Wednesday of this week. Patient to call back if rash becomes worse or if she has any other problems. Patient verbalized understanding.

## 2016-07-30 ENCOUNTER — Ambulatory Visit (INDEPENDENT_AMBULATORY_CARE_PROVIDER_SITE_OTHER): Payer: BLUE CROSS/BLUE SHIELD | Admitting: Surgery

## 2016-07-30 ENCOUNTER — Encounter: Payer: Self-pay | Admitting: Surgery

## 2016-07-30 VITALS — BP 105/53 | HR 106 | Temp 98.3°F | Ht 61.0 in | Wt 206.0 lb

## 2016-07-30 DIAGNOSIS — K810 Acute cholecystitis: Secondary | ICD-10-CM

## 2016-07-30 DIAGNOSIS — K432 Incisional hernia without obstruction or gangrene: Secondary | ICD-10-CM

## 2016-07-30 DIAGNOSIS — R21 Rash and other nonspecific skin eruption: Secondary | ICD-10-CM

## 2016-07-30 MED ORDER — MICONAZOLE NITRATE 2 % EX CREA: TOPICAL_CREAM | Freq: Two times a day (BID) | CUTANEOUS | Status: DC

## 2016-07-30 NOTE — Progress Notes (Signed)
07/30/2016  HPI: Patient is status post laparoscopic converted to open cholecystectomy with incisional hernia repair on 10/23. Due to the severe adhesions that were noted laparoscopically the surgery was converted to open cholecystectomy. She was also noted to have an incisional hernia over the superior portion of her prior midline incision and this was repaired Intra-Op as well. She was discharged to home on 10/25.  Patient reports that she was doing well initially and then developed a rash on her left flank. This moved over to rash underneath the left breast and subsequently has now also developed a mild rash over the staples on the periumbilical and right upper quadrant incisions. She stopped taking her Augmentin on 10/30 and has been taking Benadryl each has helped some of the itchiness but has not helped the rash itself yet. Otherwise denies any significant abdominal pain fevers, chills, nausea, vomiting or drainage from incisions.  Vital signs: BP (!) 105/53 (BP Location: Right Arm, Patient Position: Sitting)   Pulse (!) 106   Temp 98.3 F (36.8 C) (Oral)   Ht 5\' 1"  (1.549 m)   Wt 93.4 kg (206 lb)   LMP 10/25/2011   BMI 38.92 kg/m    Physical Exam: Constitutional: No acute distress Abdomen: Soft, obese, nondistended, nontender to palpation. Staples are clean dry and intact. However she does have papular rash over the left side with what looks like a fungal rash on the left inframammary fold and a macular rash surrounding the staples over the right upper quadrant incision as well as a primary umbilical incision. There is no cellulitis or drainage from the incisions and otherwise are healing well.  Assessment/Plan: 30 year old female status post laparoscopic converted to open cholecystectomy with incisional hernia repair.  -We'll remove the staples today in case this is a reaction to the staples causing the macular rash. Will place steri strips. -We'll also prescribe miconazole ointment  to apply to her rash as well. No antibiotics needed at this time. -Have advised not to apply steroid ointments to her wounds to prevent any wound complications. -She'll follow up with me in 1 week to assess her rash and incisions. She has been advised that if she continues to do very well and her rash disappears and there are no other concerns that she may cancel the appointment.   Melvyn Neth, [place]

## 2016-07-30 NOTE — Patient Instructions (Signed)
I have sent Miconazole cream to your pharmacy. Apply to the area twice daily for 7 days.  Please refer below for further instructions and for your future follow up appointment with Dr. Hampton Abbot next week in [facility]  Please call our office with any questions or concerns.  Please do not submerge in a tub, hot tub, or pool until incisions are completely sealed.  Use sun block to incision area over the next year if this area will be exposed to sun. This helps decrease scarring.  You may now resume your normal activities. Listen to your body when lifting, if you have pain when lifting, stop and then try again in a few days.  If you develop redness, drainage, or pain at incision sites- call our office immediately and speak with a nurse.

## 2016-07-31 ENCOUNTER — Telehealth: Payer: Self-pay | Admitting: Surgery

## 2016-07-31 ENCOUNTER — Other Ambulatory Visit: Payer: Self-pay

## 2016-07-31 DIAGNOSIS — R21 Rash and other nonspecific skin eruption: Secondary | ICD-10-CM

## 2016-07-31 MED ORDER — MICONAZOLE NITRATE 2 % EX CREA: TOPICAL_CREAM | Freq: Two times a day (BID) | CUTANEOUS | Status: DC

## 2016-07-31 NOTE — Telephone Encounter (Signed)
Pt called because the pharmacy never received the fungal cream that was prescribed to her yesterday. Please advise. She is using the [facility] on [address]

## 2016-07-31 NOTE — Telephone Encounter (Signed)
Called in the medication to her pharmacy

## 2016-08-01 ENCOUNTER — Ambulatory Visit: Payer: BLUE CROSS/BLUE SHIELD | Admitting: Family Medicine

## 2016-08-01 ENCOUNTER — Other Ambulatory Visit: Payer: Self-pay

## 2016-08-11 ENCOUNTER — Encounter: Payer: BLUE CROSS/BLUE SHIELD | Admitting: Surgery

## 2016-08-12 DIAGNOSIS — F321 Major depressive disorder, single episode, moderate: Secondary | ICD-10-CM | POA: Diagnosis not present

## 2016-08-14 DIAGNOSIS — Z933 Colostomy status: Secondary | ICD-10-CM | POA: Diagnosis not present

## 2016-09-09 ENCOUNTER — Ambulatory Visit (INDEPENDENT_AMBULATORY_CARE_PROVIDER_SITE_OTHER): Payer: BLUE CROSS/BLUE SHIELD | Admitting: Internal Medicine

## 2016-09-09 ENCOUNTER — Encounter: Payer: Self-pay | Admitting: Internal Medicine

## 2016-09-09 VITALS — BP 118/78 | HR 83 | Temp 98.1°F | Resp 12 | Ht 61.0 in | Wt 209.5 lb

## 2016-09-09 DIAGNOSIS — IMO0001 Reserved for inherently not codable concepts without codable children: Secondary | ICD-10-CM

## 2016-09-09 DIAGNOSIS — E6609 Other obesity due to excess calories: Secondary | ICD-10-CM

## 2016-09-09 DIAGNOSIS — K760 Fatty (change of) liver, not elsewhere classified: Secondary | ICD-10-CM

## 2016-09-09 DIAGNOSIS — D62 Acute posthemorrhagic anemia: Secondary | ICD-10-CM

## 2016-09-09 DIAGNOSIS — F5112 Insufficient sleep syndrome: Secondary | ICD-10-CM

## 2016-09-09 DIAGNOSIS — Z9049 Acquired absence of other specified parts of digestive tract: Secondary | ICD-10-CM

## 2016-09-09 DIAGNOSIS — Z23 Encounter for immunization: Secondary | ICD-10-CM | POA: Diagnosis not present

## 2016-09-09 DIAGNOSIS — Z6835 Body mass index (BMI) 35.0-35.9, adult: Secondary | ICD-10-CM

## 2016-09-09 LAB — HEPATIC FUNCTION PANEL
ALT: 29 U/L (ref 0–35)
AST: 17 U/L (ref 0–37)
Albumin: 4.2 g/dL (ref 3.5–5.2)
Alkaline Phosphatase: 85 U/L (ref 39–117)
Bilirubin, Direct: 0 mg/dL (ref 0.0–0.3)
Total Bilirubin: 0.3 mg/dL (ref 0.2–1.2)
Total Protein: 7.1 g/dL (ref 6.0–8.3)

## 2016-09-09 MED ORDER — METHYLPHENIDATE HCL ER (CD) 30 MG PO CPCR: 30.0000 mg | ORAL_CAPSULE | Freq: Two times a day (BID) | ORAL | 0 refills | Status: DC

## 2016-09-09 NOTE — Progress Notes (Signed)
Pre-visit discussion using our clinic review tool. No additional management support is needed unless otherwise documented below in the visit note.  

## 2016-09-09 NOTE — Progress Notes (Signed)
Patient ID: Rachael Cowan, female    DOB: 12-Jan-1986  Age: 30 y.o. MRN: [id number]  The patient is here for annual  wellness examination and management of other chronic and acute problems.   The risk factors are reflected in the social history.  The roster of all physicians providing medical care to patient - is listed in the Snapshot section of the chart.  Home safety : The patient has smoke detectors in the home. They wear seatbelts.  There are no firearms at home. There is no violence in the home.   There is no risks for hepatitis, STDs or HIV. There is no   history of blood transfusion. They have no travel history to infectious disease endemic areas of the world.  The patient has seen their dentist in the last six month. They have seen their eye doctor in the last year.    They do not  have excessive sun exposure. Discussed the need for sun protection: hats, long sleeves and use of sunscreen if there is significant sun exposure.   Diet: the importance of a healthy diet is discussed. They do have a healthy diet.  The benefits of regular aerobic exercise were discussed. She walks 4 times per week ,  20 minutes.   Depression screen: there are no signs or vegative symptoms of depression- irritability, change in appetite, anhedonia, sadness/tearfullness.  The following portions of the patient's history were reviewed and updated as appropriate: allergies, current medications, past family history, past medical history,  past surgical history, past social history  and problem list.  Visual acuity was not assessed per patient preference since she has regular follow up with her ophthalmologist. Hearing and body mass index were assessed and reviewed.   During the course of the visit the patient was educated and counseled about appropriate screening and preventive services including : fall prevention , diabetes screening, nutrition counseling, colorectal cancer screening, and recommended  immunizations.    CC: The primary encounter diagnosis was Hepatic steatosis. Diagnoses of Postoperative anemia due to acute blood loss, Primary hypersomnolence disorder, Class 2 obesity due to excess calories with serious comorbidity and body mass index (BMI) of 35.0 to 35.9 in adult, and S/P cholecystectomy were also pertinent to this visit.   Last seen in October,  prior to open cholecystectomy and incisional hernia repair  .  Liver enzymes wre normal at discharge. hgb 11.4 ,  Slight drop post operatively  noted .  fatty liver discussed in detail Binge eats:  Loves chick fil -a,  Peanut butter.  Bananas and peanut butter   High school weight was 125 . Gained the weight in college   History Rachael Cowan has a past medical history of Allergy; Asthma; Hypersomnolence disorder (2005); Rectal cancer ([place]) (05/13/2012); and Sleep apnea (2005).   She has a past surgical history that includes Arthroscopic repair ACL (2006); Abdominal hysterectomy; Colon surgery; Colonoscopy with propofol (N/A, 12/14/2015); Colostomy Placement; Tonsillectomy and adenoidectomy; and Cholecystectomy (N/A, 07/21/2016).   Her family history is not on file.She reports that she has never smoked. She has never used smokeless tobacco. She reports that she drinks alcohol. She reports that she does not use drugs.  Outpatient Medications Prior to Visit  Medication Sig Dispense Refill  . cyanocobalamin 500 MCG tablet Take 500 mcg by mouth 2 (two) times daily.    Marland Kitchen estrogens, conjugated, (PREMARIN) 0.9 MG tablet Take 1 tablet (0.9 mg total) by mouth daily. 30 tablet 11  . loratadine (CLARITIN) 10 MG tablet  Take 10 mg by mouth daily.    . montelukast (SINGULAIR) 10 MG tablet Take 10 mg by mouth every morning.    . multivitamin (ONE-A-DAY MEN'S) TABS tablet Take 1 tablet by mouth daily.    Marland Kitchen omega-3 acid ethyl esters (LOVAZA) 1 g capsule Take 1 g by mouth 2 (two) times daily.    . pantoprazole (PROTONIX) 40 MG tablet Take 40 mg by mouth  daily.     . methylphenidate (METADATE CD) 30 MG CR capsule Take 30 mg by mouth 2 (two) times daily. In the morning and in the afternoon    . ibuprofen (ADVIL,MOTRIN) 600 MG tablet Take 1 tablet (600 mg total) by mouth every 8 (eight) hours as needed for fever, mild pain or moderate pain. (Patient not taking: Reported on 09/09/2016) 30 tablet 0   Facility-Administered Medications Prior to Visit  Medication Dose Route Frequency Provider Last Rate Last Dose  . miconazole (MICOTIN) 2 % cream   Topical BID Rachael Ree, MD        Review of Systems   Patient denies headache, fevers, malaise, unintentional weight loss, skin rash, eye pain, sinus congestion and sinus pain, sore throat, dysphagia,  hemoptysis , cough, dyspnea, wheezing, chest pain, palpitations, orthopnea, edema, abdominal pain, nausea, melena, diarrhea, constipation, flank pain, dysuria, hematuria, urinary  Frequency, nocturia, numbness, tingling, seizures,  Focal weakness, Loss of consciousness,  Tremor, insomnia, depression, anxiety, and suicidal ideation.     Objective:  BP 118/78   Pulse 83   Temp 98.1 F (36.7 C) (Oral)   Resp 12   Ht 5\' 1"  (1.549 m)   Wt 209 lb 8 oz (95 kg)   LMP 10/25/2011   SpO2 98%   BMI 39.58 kg/m   Physical Exam  General appearance: alert, cooperative and appears stated age Head: Normocephalic, without obvious abnormality, atraumatic Eyes: conjunctivae/corneas clear. PERRL, EOM's intact. Fundi benign. Ears: normal TM's and external ear canals both ears Nose: Nares normal. Septum midline. Mucosa normal. No drainage or sinus tenderness. Throat: lips, mucosa, and tongue normal; teeth and gums normal Neck: no adenopathy, no carotid bruit, no JVD, supple, symmetrical, trachea midline and thyroid not enlarged, symmetric, no tenderness/mass/nodules Lungs: clear to auscultation bilaterally Breasts: normal appearance, no masses or tenderness Heart: regular rate and rhythm, S1, S2 normal, no murmur,  click, rub or gallop Abdomen: soft, non-tender; bowel sounds normal; no masses,  no organomegaly Extremities: extremities normal, atraumatic, no cyanosis or edema Pulses: 2+ and symmetric Skin: Skin color, texture, turgor normal. No rashes or lesions Neurologic: Alert and oriented X 3, normal strength and tone. Normal symmetric reflexes. Normal coordination and gait.     Assessment & Plan:   Problem List Items Addressed This Visit    Hepatic steatosis - Primary    Presumed by ultrasound changes,  Needs serologies to rule out autoimmune causes of hepatitis.  Current liver enzymes are normal and all modifiable risk factors including obesity, diabetes and hyperlipidemia have been addressed   Lab Results  Component Value Date   ALT 29 09/09/2016   AST 17 09/09/2016   ALKPHOS 85 09/09/2016   BILITOT 0.3 09/09/2016         Relevant Orders   ANA (Completed)   Anti-Smith antibody (Completed)   Ferritin (Completed)   Hepatic function panel (Completed)   Hepatitis B core antibody, total (Completed)   Hepatitis B surface antibody (Completed)   Hepatitis B surface antigen (Completed)   Hepatitis C antibody (Completed)   Iron and  TIBC (Completed)   CBC with Differential/Platelet (Completed)   Obesity    I have addressed  BMI and recommended a low glycemic index diet utilizing smaller more frequent meals to increase metabolism.  I have also recommended that patient start exercising with a goal of 30 minutes of aerobic exercise a minimum of 5 days per week. Screening for lipid disorders, thyroid and diabetes to be done today.        Relevant Medications   methylphenidate (METADATE CD) 30 MG CR capsule   Primary hypersomnolence disorder    Managed with Metadate. Refills given.      S/P cholecystectomy    Other Visit Diagnoses    Postoperative anemia due to acute blood loss       Relevant Orders   CBC with Differential/Platelet (Completed)      I have changed Ms. Schmaltz  methylphenidate. I am also having her maintain her pantoprazole, loratadine, estrogens (conjugated), cyanocobalamin, montelukast, omega-3 acid ethyl esters, multivitamin, and ibuprofen. We will continue to administer miconazole.  Meds ordered this encounter  Medications  . methylphenidate (METADATE CD) 30 MG CR capsule    Sig: Take 1 capsule (30 mg total) by mouth 2 (two) times daily. In the morning and in the afternoon    Dispense:  60 capsule    Refill:  0    Medications Discontinued During This Encounter  Medication Reason  . methylphenidate (METADATE CD) 30 MG CR capsule Reorder    Follow-up: No Follow-up on file.   [name], MD

## 2016-09-09 NOTE — Patient Instructions (Addendum)
I am recommending a low glycemic index Mediterranean diet to help you lose weight  You might want to try a premixed protein drink called Premier Protein shake for breakfast or late night snack . It is great tasting,   very low sugar and available of < $2 serving at [facility] and  In bulk for $1.50/serving at [employer] and [employer]  .    Nutritional analysis :  160 cal  30 g protein  1 g sugar 50% calcium needs   [employer] and [employer] Atkins and EAS (AdvantEdge carb Control,  ) and Muscle Milk   KIND  Low glycemic  index 5 g sugar Atkins [employer]  Salads are wonderful,  Just don't add candied nuts, "confetti"  And croutons.  Here's a simple one :  Kale,  Avocado,  Cooked beats,  And cherry tomatoes  Salad dressing : equal  parts canola and white wine vinegar,  Dash of tobasco  dreamfield's pasta is low carb and can be eaten once a week   [employer]'s has great frozen dinners including chicken piccata  That go great over pasta   Breyer's carb smart  Ice cream treats on a stick  Are very low carb    To make a low carb chip :  Take the Joseph's Lavash or Pita bread,  Or the [place] Low carb whole wheat tortilla   Place on metal cookie sheet  Brush with olive oil  Sprinkle garlic powder (NOT garlic salt), grated parmesan cheese, mediterranean seasoning , or all of them?  Bake at 275 for 30 minutes   We have substitutions for your potatoes!!  Try the mashed cauliflower and riced cauliflower dishes instead of rice and mashed potatoes ([employer]  Plain)   Mashed turnips are also very low carb!   For desserts :  Try the Dannon Lt n Fit greek yogurt dessert flavors and top with reddi Whip .  8 carbs,  80 calories  Try [employer]ikos Triple Zero [place] Yogurt in the salted caramel, and the coffee flavors  With Whipped Cream for dessert  breyer's low carb ice cream, available in bars (on a stick, better ) or scoopable ice cream  HERE ARE THE LOW CARB  BREAD CHOICES

## 2016-09-10 DIAGNOSIS — Z9049 Acquired absence of other specified parts of digestive tract: Secondary | ICD-10-CM | POA: Insufficient documentation

## 2016-09-10 LAB — HEPATITIS B CORE ANTIBODY, TOTAL: Hep B Core Total Ab: NONREACTIVE

## 2016-09-10 LAB — CBC WITH DIFFERENTIAL/PLATELET
Basophils Absolute: 0 10*3/uL (ref 0.0–0.1)
Basophils Relative: 0.6 % (ref 0.0–3.0)
Eosinophils Absolute: 0.1 10*3/uL (ref 0.0–0.7)
Eosinophils Relative: 1.3 % (ref 0.0–5.0)
HCT: 38.8 % (ref 36.0–46.0)
Hemoglobin: 13.1 g/dL (ref 12.0–15.0)
Lymphocytes Relative: 40 % (ref 12.0–46.0)
Lymphs Abs: 2.2 10*3/uL (ref 0.7–4.0)
MCHC: 33.6 g/dL (ref 30.0–36.0)
MCV: 86.6 fl (ref 78.0–100.0)
Monocytes Absolute: 0.3 10*3/uL (ref 0.1–1.0)
Monocytes Relative: 5.8 % (ref 3.0–12.0)
Neutro Abs: 2.8 10*3/uL (ref 1.4–7.7)
Neutrophils Relative %: 52.3 % (ref 43.0–77.0)
Platelets: 242 10*3/uL (ref 150.0–400.0)
RBC: 4.48 Mil/uL (ref 3.87–5.11)
RDW: 15.1 % (ref 11.5–15.5)
WBC: 5.4 10*3/uL (ref 4.0–10.5)

## 2016-09-10 LAB — IRON AND TIBC
%SAT: 12 % (ref 11–50)
Iron: 48 ug/dL (ref 40–190)
TIBC: 403 ug/dL (ref 250–450)
UIBC: 355 ug/dL (ref 125–400)

## 2016-09-10 LAB — FERRITIN: Ferritin: 71.9 ng/mL (ref 10.0–291.0)

## 2016-09-10 LAB — ANA: Anti Nuclear Antibody(ANA): NEGATIVE

## 2016-09-10 LAB — HEPATITIS B SURFACE ANTIGEN: Hepatitis B Surface Ag: NEGATIVE

## 2016-09-10 LAB — HEPATITIS C ANTIBODY: HCV Ab: NEGATIVE

## 2016-09-10 LAB — HEPATITIS B SURFACE ANTIBODY,QUALITATIVE

## 2016-09-10 LAB — ANTI-SMITH ANTIBODY: ENA SM Ab Ser-aCnc: <1

## 2016-09-10 NOTE — Assessment & Plan Note (Signed)
I have addressed  BMI and recommended a low glycemic index diet utilizing smaller more frequent meals to increase metabolism.  I have also recommended that patient start exercising with a goal of 30 minutes of aerobic exercise a minimum of 5 days per week. Screening for lipid disorders, thyroid and diabetes to be done today.   

## 2016-09-10 NOTE — Assessment & Plan Note (Signed)
Presumed by ultrasound changes,  Needs serologies to rule out autoimmune causes of hepatitis.  Current liver enzymes are normal and all modifiable risk factors including obesity, diabetes and hyperlipidemia have been addressed   Lab Results  Component Value Date   ALT 29 09/09/2016   AST 17 09/09/2016   ALKPHOS 85 09/09/2016   BILITOT 0.3 09/09/2016

## 2016-09-10 NOTE — Assessment & Plan Note (Signed)
Managed with Metadate. Refills given. 

## 2016-09-11 ENCOUNTER — Encounter: Payer: Self-pay | Admitting: Internal Medicine

## 2016-09-11 DIAGNOSIS — Z23 Encounter for immunization: Secondary | ICD-10-CM

## 2016-09-11 DIAGNOSIS — K760 Fatty (change of) liver, not elsewhere classified: Secondary | ICD-10-CM | POA: Diagnosis not present

## 2016-10-01 DIAGNOSIS — F321 Major depressive disorder, single episode, moderate: Secondary | ICD-10-CM | POA: Diagnosis not present

## 2016-10-02 ENCOUNTER — Telehealth: Payer: Self-pay | Admitting: Surgery

## 2016-10-02 NOTE — Telephone Encounter (Signed)
Patient had open gallbladder surgery 10/23 and now feels her scar is hard with some pinching when she moves and it's getting painful as she moves. Please call.

## 2016-10-02 NOTE — Telephone Encounter (Signed)
Spoke with patient at this time. She is having increased pain with movement directly under the incision site. She moved furniture on Tuesday evening (09/30/16) and then began feeling pain this morning (10/02/16). Denies bulge at site but states that it feels "hard" at incision site.  Explained to patient that she may take lotion and massage area daily to help break up scar tissue but I would like for her to see her surgeon, Dr. Hampton Abbot to rule out an incisional hernia since pain began after moving heavy furniture. She verbalizes understanding and patient was placed on schedule.

## 2016-10-07 ENCOUNTER — Other Ambulatory Visit: Payer: Self-pay

## 2016-10-08 ENCOUNTER — Encounter: Payer: Self-pay | Admitting: Surgery

## 2016-10-08 ENCOUNTER — Ambulatory Visit (INDEPENDENT_AMBULATORY_CARE_PROVIDER_SITE_OTHER): Payer: BLUE CROSS/BLUE SHIELD | Admitting: Surgery

## 2016-10-08 VITALS — BP 125/84 | HR 97 | Temp 98.5°F | Wt 204.0 lb

## 2016-10-08 DIAGNOSIS — Z9049 Acquired absence of other specified parts of digestive tract: Secondary | ICD-10-CM

## 2016-10-08 NOTE — Patient Instructions (Signed)
Please have your CT Scan done that was ordered by your Oncologist. When you see them, ask if there is anything showing up that is concerning and if there is, please call us and let us know so we could see you back. Otherwise, there is no need to see you back. However, give [place] a call if you have any questions or concerns.

## 2016-10-08 NOTE — Progress Notes (Signed)
10/08/2016  HPI: Patient status post laparoscopic converted to open cholecystectomy on 10/23 for acute cholecystitis. Patient presents today because last week after helping her mom move she started having pain in the right upper quadrant around incision area. Her pain dissipated 3 days ago she's been able to continue her daily activities without further issues. Today she is pain-free but she wanted to be seen to make sure there are no issues after her surgery. He was having fevers, chills, chest pain, shortness of breath, nausea, or vomiting.  Vital signs: BP 125/84   Pulse 97   Temp 98.5 F (36.9 C) (Oral)   Wt 92.5 kg (204 lb)   LMP 10/25/2011   BMI 38.55 kg/m    Physical Exam: Constitutional: No acute distress Abdomen: Soft, nondistended, nontender to palpation. Patient's right upper quadrant incision is clean dry and intact and healing well. On palpation there is no evidence of any hernia defect along the incision. In the middle of the incision there is an area consistent with the PDS suture knot which on deep palpation elicits mild discomfort.  Assessment/Plan: 31 year old female status post laparoscopic converted to open cholecystectomy.  -On exam there is no evidence of any hernia along the incision. The incision has healed well. -The pain the patient experienced may be related to a pulled muscle. I reassured the patient and she may continue her activities as tolerated. -The patient has a repeat CT scan scheduled in 1 week as part of her 5 year oncologic check and the patient was asking if she should continue with this CT scan given that she had one back in October. I recommended that she call the oncology clinic to confirm if the CT scan as needed as they may require different phase contrast CT compared to the one that she had back in October. Patient understands this and she will call the oncology clinic to confirm. -Patient reports that she thinks that the allergy that was reported  for Augmentin was placed and air as she thinks the allergy that she had over the skin was more related to the staples are to the medication. We'll remove this from her allergy list. -Patient may follow-up with [place] on an as-needed basis.   Melvyn Neth, [place]

## 2016-10-15 ENCOUNTER — Ambulatory Visit
Admission: RE | Admit: 2016-10-15 | Discharge: 2016-10-15 | Disposition: A | Discharge status: Home / Self Care | Payer: BLUE CROSS/BLUE SHIELD | Source: Ambulatory Visit | Attending: Hematology and Oncology | Admitting: Hematology and Oncology

## 2016-10-15 DIAGNOSIS — K429 Umbilical hernia without obstruction or gangrene: Secondary | ICD-10-CM | POA: Insufficient documentation

## 2016-10-15 DIAGNOSIS — Z9049 Acquired absence of other specified parts of digestive tract: Secondary | ICD-10-CM | POA: Insufficient documentation

## 2016-10-15 DIAGNOSIS — C20 Malignant neoplasm of rectum: Secondary | ICD-10-CM

## 2016-10-15 DIAGNOSIS — Z90710 Acquired absence of both cervix and uterus: Secondary | ICD-10-CM | POA: Diagnosis not present

## 2016-10-15 DIAGNOSIS — Z85048 Personal history of other malignant neoplasm of rectum, rectosigmoid junction, and anus: Secondary | ICD-10-CM | POA: Insufficient documentation

## 2016-10-15 DIAGNOSIS — Z933 Colostomy status: Secondary | ICD-10-CM | POA: Insufficient documentation

## 2016-10-15 DIAGNOSIS — K435 Parastomal hernia without obstruction or  gangrene: Secondary | ICD-10-CM | POA: Diagnosis not present

## 2016-10-15 MED ORDER — IOPAMIDOL (ISOVUE-300) INJECTION 61%
100.0000 mL | Freq: Once | INTRAVENOUS | Status: AC | PRN
  Administered 2016-10-15: 100 mL via INTRAVENOUS

## 2016-10-20 DIAGNOSIS — Z933 Colostomy status: Secondary | ICD-10-CM | POA: Diagnosis not present

## 2016-10-23 ENCOUNTER — Inpatient Hospital Stay: Payer: BLUE CROSS/BLUE SHIELD | Attending: Hematology and Oncology

## 2016-10-23 ENCOUNTER — Encounter: Payer: Self-pay | Admitting: Hematology and Oncology

## 2016-10-23 ENCOUNTER — Inpatient Hospital Stay (HOSPITAL_BASED_OUTPATIENT_CLINIC_OR_DEPARTMENT_OTHER): Payer: BLUE CROSS/BLUE SHIELD | Admitting: Hematology and Oncology

## 2016-10-23 VITALS — BP 127/91 | HR 82 | Resp 18 | Wt 200.6 lb

## 2016-10-23 DIAGNOSIS — Z90722 Acquired absence of ovaries, bilateral: Secondary | ICD-10-CM | POA: Diagnosis not present

## 2016-10-23 DIAGNOSIS — Z933 Colostomy status: Secondary | ICD-10-CM | POA: Diagnosis not present

## 2016-10-23 DIAGNOSIS — K435 Parastomal hernia without obstruction or  gangrene: Secondary | ICD-10-CM | POA: Insufficient documentation

## 2016-10-23 DIAGNOSIS — Z85048 Personal history of other malignant neoplasm of rectum, rectosigmoid junction, and anus: Secondary | ICD-10-CM

## 2016-10-23 DIAGNOSIS — C20 Malignant neoplasm of rectum: Secondary | ICD-10-CM

## 2016-10-23 DIAGNOSIS — Z9221 Personal history of antineoplastic chemotherapy: Secondary | ICD-10-CM | POA: Insufficient documentation

## 2016-10-23 DIAGNOSIS — Z9049 Acquired absence of other specified parts of digestive tract: Secondary | ICD-10-CM | POA: Insufficient documentation

## 2016-10-23 DIAGNOSIS — Z79899 Other long term (current) drug therapy: Secondary | ICD-10-CM | POA: Diagnosis not present

## 2016-10-23 DIAGNOSIS — Z90710 Acquired absence of both cervix and uterus: Secondary | ICD-10-CM | POA: Diagnosis not present

## 2016-10-23 DIAGNOSIS — Z923 Personal history of irradiation: Secondary | ICD-10-CM

## 2016-10-23 LAB — COMPREHENSIVE METABOLIC PANEL
ALT: 35 U/L (ref 14–54)
AST: 27 U/L (ref 15–41)
Albumin: 4.4 g/dL (ref 3.5–5.0)
Alkaline Phosphatase: 84 U/L (ref 38–126)
Anion gap: 12 (ref 5–15)
BUN: 15 mg/dL (ref 6–20)
CO2: 22 mmol/L (ref 22–32)
Calcium: 9.7 mg/dL (ref 8.9–10.3)
Chloride: 102 mmol/L (ref 101–111)
Creatinine, Ser: 0.94 mg/dL (ref 0.44–1.00)
GFR calc Af Amer: >60 mL/min (ref >60)
GFR calc non Af Amer: >60 mL/min (ref >60)
Glucose, Bld: 104 mg/dL — ABNORMAL HIGH (ref 65–99)
Potassium: 3.9 mmol/L (ref 3.5–5.1)
Sodium: 136 mmol/L (ref 135–145)
Total Bilirubin: 0.9 mg/dL (ref 0.3–1.2)
Total Protein: 7.9 g/dL (ref 6.5–8.1)

## 2016-10-23 LAB — CBC WITH DIFFERENTIAL/PLATELET
Basophils Absolute: 0 10*3/uL (ref 0–0.1)
Basophils Relative: 1 %
Eosinophils Absolute: 0.1 10*3/uL (ref 0–0.7)
Eosinophils Relative: 1 %
HCT: 40.1 % (ref 35.0–47.0)
Hemoglobin: 13.6 g/dL (ref 12.0–16.0)
Lymphocytes Relative: 37 %
Lymphs Abs: 2.2 10*3/uL (ref 1.0–3.6)
MCH: 28.8 pg (ref 26.0–34.0)
MCHC: 33.8 g/dL (ref 32.0–36.0)
MCV: 85 fL (ref 80.0–100.0)
Monocytes Absolute: 0.4 10*3/uL (ref 0.2–0.9)
Monocytes Relative: 6 %
Neutro Abs: 3.4 10*3/uL (ref 1.4–6.5)
Neutrophils Relative %: 55 %
Platelets: 216 10*3/uL (ref 150–440)
RBC: 4.72 MIL/uL (ref 3.80–5.20)
RDW: 15.3 % — ABNORMAL HIGH (ref 11.5–14.5)
WBC: 6.1 10*3/uL (ref 3.6–11.0)

## 2016-10-23 NOTE — Progress Notes (Signed)
Patient recently moved back here from [place].  States she had her gall bladder out in October.  Offers no other complaints.

## 2016-10-23 NOTE — Progress Notes (Signed)
[facility] day:  10/23/16  Chief Complaint: JANAIYAH BLACKARD is a 31 y.o. female with a history of stage IIIB rectal cancer who is seen for 6 month assessment.  HPI:  The patient was last seen in the medical oncology clinic on 04/25/2016.  At that time, he was seen for initial assessment by me.  She is doing well.  Exam was unremarkable.  CBC with diff and CMP were normal.  CEA was 0.9.  We discussed yearly imaging.  She saw Dr. Theora Gianotti on 06/11/2016.  Clinically there was no evidence of disease. Menopause was managed with ERT.  There was abnormal anatomy. There was urinary incontinence with cystocele.  It was recommended that she continue ERT with weaning at age 22.  Vaginal dilators were recommended to prevent stenosis. She was referred to urogynecology for urinary incontinence (no appt yest).  She was admitted to [facility] from 07/20/2016 - 07/23/2016 with acute cholecystitis.  She underwent laparoscopic converted to open cholecystectomy and incisional hernia repair.  Abdomen and pelvic CT scan on 10/15/2016 revealed no acute findings in the abdomen or pelvis.  There was no specific features identified to suggest recurrent rectal cancer or metastatic disease.  There was left lower quadrant colostomy with fat containing parastomal hernia.  There was evidence of mild chronic inflammation of the distal colon noted up to the level of the ostomy.  Symptomatically, she is doing well.  She is losing weight on a low carb diet.  She is more active.   Past Medical History:  Diagnosis Date  . Allergy   . Asthma   . Hypersomnolence disorder 2005   managed with metidate  . Rectal cancer ([place]) 05/13/2012   Resection with colostomy and hysterectomy.  . Sleep apnea 2005   resolved with ENT surgery,  [name]    Past Surgical History:  Procedure Laterality Date  . ABDOMINAL HYSTERECTOMY    . ARTHROSCOPIC REPAIR ACL  2006   right knee Dr Donna Christen at [facility]  .  CHOLECYSTECTOMY N/A 07/21/2016   Procedure: LAPAROSCOPIC CHOLECYSTECTOMY CONVERTED TO OPEN;  Surgeon: Olean Ree, MD;  Location: [facility] ORS;  Service: General;  Laterality: N/A;  . COLON SURGERY    . COLONOSCOPY WITH PROPOFOL N/A 12/14/2015   Procedure: COLONOSCOPY WITH PROPOFOL;  Surgeon: Manya Silvas, MD;  Location: [facility] ENDOSCOPY;  Service: Endoscopy;  Laterality: N/A;  . Colostomy Placement    . TONSILLECTOMY AND ADENOIDECTOMY      Family History  Problem Relation Age of Onset  . Hyperlipidemia Neg Hx     Social History:  reports that she has never smoked. She has never used smokeless tobacco. She reports that she drinks alcohol. She reports that she does not use drugs.  She lives in [place], [place], but plans on moving back to [place] where her mother and rest of her family lives.  She graduated in 08/2015.  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  She has 2 siblings.  She moved from [place] to [place].  The patient is alone today.  Allergies: No Known Allergies  Current Medications: Current Outpatient Prescriptions  Medication Sig Dispense Refill  . cyanocobalamin 500 MCG tablet Take 500 mcg by mouth 2 (two) times daily.    Marland Kitchen estrogens, conjugated, (PREMARIN) 0.9 MG tablet Take 1 tablet (0.9 mg total) by mouth daily. 30 tablet 11  . loratadine (CLARITIN) 10 MG tablet Take 10 mg by mouth daily.    Marland Kitchen  methylphenidate (METADATE CD) 30 MG CR capsule Take 1 capsule (30 mg total) by mouth 2 (two) times daily. In the morning and in the afternoon 60 capsule 0  . montelukast (SINGULAIR) 10 MG tablet Take 10 mg by mouth every morning.    . Multiple Vitamins-Calcium (ONE-A-DAY WOMENS PO) Take by mouth.    . omega-3 acid ethyl esters (LOVAZA) 1 g capsule Take 1 g by mouth 2 (two) times daily.     Current Facility-Administered Medications  Medication Dose Route Frequency Provider Last Rate Last Dose  . miconazole (MICOTIN) 2 % cream   Topical BID Olean Ree, MD         Review of Systems:  GENERAL:  Feels "good".  Active.  No fevers or sweats.  Weight loss of 28 pounds in 6 months (intentional). PERFORMANCE STATUS (ECOG):  1 HEENT:  No visual changes, runny nose, sore throat, mouth sores or tenderness. Lungs: No shortness of breath or cough.  No hemoptysis. Cardiac:  No chest pain, palpitations, orthopnea, or PND. GI:  No nausea, vomiting, diarrhea, constipation, melena or hematochezia.  Colonoscopy planned for next year. GU:  No urgency, frequency, dysuria, or hematuria.  s/p TAH/BSO on Premarin. Musculoskeletal:  No back pain.  No joint pain.  No muscle tenderness. Extremities:  No pain or swelling. Skin:  No rashes or skin changes. Neuro:  No headache, numbness or weakness, balance or coordination issues. Endocrine:  No diabetes, thyroid issues, hot flashes or night sweats. Psych:  No mood changes, depression or anxiety.  Working on therapy. Pain:  No focal pain. Review of systems:  All other systems reviewed and found to be negative.  Physical Exam: Blood pressure (!) 127/91, pulse 82, resp. rate 18, weight 200 lb 9.9 oz (91 kg), last menstrual period 10/25/2011. GENERAL:  Well developed, well nourished, woman sitting comfortably in the exam room in no acute distress. MENTAL STATUS:  Alert and oriented to person, place and time. HEAD:  Long brown hair.  Normocephalic, atraumatic, face symmetric, no Cushingoid features. EYES:  Glasses.  Blue eyes.  Pupils equal round and reactive to light and accomodation.  No conjunctivitis or scleral icterus. ENT:  Oropharynx clear without lesion.  Tongue normal. Mucous membranes moist.  RESPIRATORY:  Clear to auscultation without rales, wheezes or rhonchi. CARDIOVASCULAR:  Regular rate and rhythm without murmur, rub or gallop. ABDOMEN:  Colostomy.  Soft, non-tender, with active bowel sounds, and no appreciable hepatosplenomegaly.  No masses. SKIN:  Tan.  No rashes, ulcers or lesions. EXTREMITIES: No edema,  no skin discoloration or tenderness.  No palpable cords. LYMPH NODES: No palpable cervical, supraclavicular, axillary or inguinal adenopathy  NEUROLOGICAL: Unremarkable. PSYCH:  Appropriate.   Appointment on 10/23/2016  Component Date Value Ref Range Status  . WBC 10/23/2016 6.1  3.6 - 11.0 K/uL Final  . RBC 10/23/2016 4.72  3.80 - 5.20 MIL/uL Final  . Hemoglobin 10/23/2016 13.6  12.0 - 16.0 g/dL Final  . HCT 10/23/2016 40.1  35.0 - 47.0 % Final  . MCV 10/23/2016 85.0  80.0 - 100.0 fL Final  . MCH 10/23/2016 28.8  26.0 - 34.0 pg Final  . MCHC 10/23/2016 33.8  32.0 - 36.0 g/dL Final  . RDW 10/23/2016 15.3* 11.5 - 14.5 % Final  . Platelets 10/23/2016 216  150 - 440 K/uL Final  . Neutrophils Relative % 10/23/2016 55  % Final  . Neutro Abs 10/23/2016 3.4  1.4 - 6.5 K/uL Final  . Lymphocytes Relative 10/23/2016 37  % Final  .  Lymphs Abs 10/23/2016 2.2  1.0 - 3.6 K/uL Final  . Monocytes Relative 10/23/2016 6  % Final  . Monocytes Absolute 10/23/2016 0.4  0.2 - 0.9 K/uL Final  . Eosinophils Relative 10/23/2016 1  % Final  . Eosinophils Absolute 10/23/2016 0.1  0 - 0.7 K/uL Final  . Basophils Relative 10/23/2016 1  % Final  . Basophils Absolute 10/23/2016 0.0  0 - 0.1 K/uL Final  . Sodium 10/23/2016 136  135 - 145 mmol/L Final  . Potassium 10/23/2016 3.9  3.5 - 5.1 mmol/L Final  . Chloride 10/23/2016 102  101 - 111 mmol/L Final  . CO2 10/23/2016 22  22 - 32 mmol/L Final  . Glucose, Bld 10/23/2016 104* 65 - 99 mg/dL Final  . BUN 10/23/2016 15  6 - 20 mg/dL Final  . Creatinine, Ser 10/23/2016 0.94  0.44 - 1.00 mg/dL Final  . Calcium 10/23/2016 9.7  8.9 - 10.3 mg/dL Final  . Total Protein 10/23/2016 7.9  6.5 - 8.1 g/dL Final  . Albumin 10/23/2016 4.4  3.5 - 5.0 g/dL Final  . AST 10/23/2016 27  15 - 41 U/L Final  . ALT 10/23/2016 35  14 - 54 U/L Final  . Alkaline Phosphatase 10/23/2016 84  38 - 126 U/L Final  . Total Bilirubin 10/23/2016 0.9  0.3 - 1.2 mg/dL Final  . GFR calc non Af Amer  10/23/2016 >60  >60 mL/min Final  . GFR calc Af Amer 10/23/2016 >60  >60 mL/min Final   Comment: (NOTE) The eGFR has been calculated using the CKD EPI equation. This calculation has not been validated in all clinical situations. eGFR's persistently <60 mL/min signify possible Chronic Kidney Disease.   . Anion gap 10/23/2016 12  5 - 15 Final    Assessment:  NECOLA BLUESTEIN is a 31 y.o. female with a history of stage IIIB (T4N1M0) rectal cancer s/p neoadjuvant radiation and 5FU (completed on 03/07/2012) followed by rectosigmoidectomy, hysterectomy and oophorectomy on 05/13/2012 at [facility].  Pathologic stage was ypT2ypN0.  She began post-operative FOLFOX chemotherapy in 05/2012.  She received 11 cycles (completed in 12/2012) secondary to neutropenia.  Abdomen and pelvic CT on 10/16/2015 noted the resection for distal rectal adenocarcinoma. There was stable appearance of the lower anatomic pelvis with no new or progressive soft tissue to suggest local recurrence.  There was a parastomal hernia containing an increased amount of pericolonic fat compared to the previous study.  Abdomen and pelvic CT on 10/15/2016 revealed no acute findings in the abdomen or pelvis.  There was no specific features identified to suggest recurrent rectal cancer or metastatic disease.  There was left lower quadrant colostomy with fat containing parastomal hernia.  There was evidence of mild chronic inflammation of the distal colon noted up to the level of the ostomy.  CEA has been followed:  1.0 on 12/19/2013, 0.8 on 05/08/2014, 1.1 on 10/12/2015, 0.9 on 04/25/2016 and 1.1 on 10/23/2016.   Colonoscopy on 12/14/2015 revealed one diminutive polyp in the sigmoid colon.  Pathology revealed a tubular adenoma with no evidence of dysplasia or malignancy.  Nodules at the top of the colostomy revealed inflamed and ulcerated colocutaneous tissue.  She has no family history of colon cancer.  My Risk genetic testing was negative per  patient report.  Symptomatically, she is doing well.  Exam is unremarkable.  Plan: 1.  Labs today:  CBC with diff, CMP, CEA. 2.  Discuss GYN onc evaluation. 3.  Port removal with Dr. Lucky Cowboy. 4.  RTC in 1 year for MD assessment and labs (CBC with diff, CMP, CEA).   Lequita Asal, MD  10/23/2016, 10:40 AM

## 2016-10-24 LAB — CEA: CEA: 1.1 ng/mL (ref 0.0–4.7)

## 2016-10-28 ENCOUNTER — Other Ambulatory Visit: Payer: Self-pay | Admitting: Internal Medicine

## 2016-10-30 MED ORDER — METHYLPHENIDATE HCL ER (CD) 30 MG PO CPCR: 30.0000 mg | ORAL_CAPSULE | Freq: Two times a day (BID) | ORAL | 0 refills | Status: DC

## 2016-10-30 NOTE — Telephone Encounter (Signed)
Refilled for February and March

## 2016-10-30 NOTE — Telephone Encounter (Signed)
Signed Rxs upfront for p/u. Pt informed and advised to keep 12/10/16 ROV with PCP as scheduled.

## 2016-11-04 ENCOUNTER — Ambulatory Visit (INDEPENDENT_AMBULATORY_CARE_PROVIDER_SITE_OTHER): Payer: BLUE CROSS/BLUE SHIELD | Admitting: Vascular Surgery

## 2016-11-04 ENCOUNTER — Encounter (INDEPENDENT_AMBULATORY_CARE_PROVIDER_SITE_OTHER): Payer: Self-pay

## 2016-11-04 ENCOUNTER — Encounter (INDEPENDENT_AMBULATORY_CARE_PROVIDER_SITE_OTHER): Payer: Self-pay | Admitting: Vascular Surgery

## 2016-11-04 VITALS — BP 118/86 | HR 89 | Resp 16 | Ht 62.0 in | Wt 196.0 lb

## 2016-11-04 DIAGNOSIS — C20 Malignant neoplasm of rectum: Secondary | ICD-10-CM | POA: Diagnosis not present

## 2016-11-04 DIAGNOSIS — K8020 Calculus of gallbladder without cholecystitis without obstruction: Secondary | ICD-10-CM

## 2016-11-04 NOTE — Assessment & Plan Note (Signed)
Had a cholecystectomy last year

## 2016-11-04 NOTE — Progress Notes (Signed)
Patient ID: Rachael Cowan, female   DOB: 25-Dec-1985, 32 y.o.   MRN: [id number]  Chief Complaint  Patient presents with  . New Patient (Initial Visit)    HPI Rachael Cowan is a 31 y.o. female.  I am asked to see the patient by Dr. Mike Gip for evaluation of port removal.  The patient reports Port being placed about 5 years ago for chemotherapy as part of treatment for rectal cancer. She has had a rectal resection with subsequent colostomy and hysterectomy. She is now cancer free and no longer needs her Port-A-Cath. This no longer withdraws blood but is still usable for an IV access. She does not have specific complaints today. She denies fever or chills or signs of systemic infection.   Past Medical History:  Diagnosis Date  . Allergy   . Asthma   . Hypersomnolence disorder 2005   managed with metidate  . Rectal cancer ([place]) 05/13/2012   Resection with colostomy and hysterectomy.  . Sleep apnea 2005   resolved with ENT surgery,  [name]    Past Surgical History:  Procedure Laterality Date  . ABDOMINAL HYSTERECTOMY    . ARTHROSCOPIC REPAIR ACL  2006   right knee Dr Donna Christen at [facility]  . CHOLECYSTECTOMY N/A 07/21/2016   Procedure: LAPAROSCOPIC CHOLECYSTECTOMY CONVERTED TO OPEN;  Surgeon: Olean Ree, MD;  Location: [facility] ORS;  Service: General;  Laterality: N/A;  . COLON SURGERY    . COLONOSCOPY WITH PROPOFOL N/A 12/14/2015   Procedure: COLONOSCOPY WITH PROPOFOL;  Surgeon: Manya Silvas, MD;  Location: [facility] ENDOSCOPY;  Service: Endoscopy;  Laterality: N/A;  . Colostomy Placement    . TONSILLECTOMY AND ADENOIDECTOMY      Family History  Problem Relation Age of Onset  . Heart attack Maternal Grandfather   . Hyperlipidemia Paternal Grandfather   . Hypertension Paternal Grandfather      Social History Social History  Substance Use Topics  . Smoking status: Never Smoker  . Smokeless tobacco: Never Used  . Alcohol use Yes     Comment: ocassional     No Known  Allergies  Current Outpatient Prescriptions  Medication Sig Dispense Refill  . cyanocobalamin 500 MCG tablet Take 500 mcg by mouth 2 (two) times daily.    Marland Kitchen estrogens, conjugated, (PREMARIN) 0.9 MG tablet Take 1 tablet (0.9 mg total) by mouth daily. 30 tablet 11  . loratadine (CLARITIN) 10 MG tablet Take 10 mg by mouth daily.    . methylphenidate (METADATE CD) 30 MG CR capsule Take 1 capsule (30 mg total) by mouth 2 (two) times daily. In the morning and in the afternoon 60 capsule 0  . montelukast (SINGULAIR) 10 MG tablet Take 10 mg by mouth every morning.    . Multiple Vitamins-Calcium (ONE-A-DAY WOMENS PO) Take by mouth.    . omega-3 acid ethyl esters (LOVAZA) 1 g capsule Take 1 g by mouth 2 (two) times daily.     Current Facility-Administered Medications  Medication Dose Route Frequency Provider Last Rate Last Dose  . miconazole (MICOTIN) 2 % cream   Topical BID Olean Ree, MD          REVIEW OF SYSTEMS (Negative unless checked)  Constitutional: _0 Weight loss  _1 Fever  _2 Chills Cardiac: _3 Chest pain   _4 Chest pressure   _5 Palpitations   _6 Shortness of breath when laying flat   _7 Shortness of breath at rest   _8 Shortness of breath with exertion. Vascular:  _9 Pain in legs with walking   _10 Pain in  legs at rest   _0 Pain in legs when laying flat   _1 Claudication   _2 Pain in feet when walking  _3 Pain in feet at rest  _4 Pain in feet when laying flat   _5 History of DVT   _6 Phlebitis   _7 Swelling in legs   _8 Varicose veins   _9 Non-healing ulcers Pulmonary:   _10 Uses home oxygen   _11 Productive cough   _12 Hemoptysis   _13 Wheeze  _14 COPD   _15 Asthma Neurologic:  _16 Dizziness  _17 Blackouts   _18 Seizures   _19 History of stroke   _20 History of TIA  _21 Aphasia   _22 Temporary blindness   _23 Dysphagia   _24 Weakness or numbness in arms   _25 Weakness or numbness in legs Musculoskeletal:  _26 Arthritis   _27 Joint swelling   _28 Joint pain   _29 Low back pain Hematologic:  _30 Easy bruising  _31 Easy bleeding   _32 Hypercoagulable  state   _33 Anemic  _34 Hepatitis Gastrointestinal:  _35 Blood in stool   _36 Vomiting blood  _37 Gastroesophageal reflux/heartburn   _38 Abdominal pain Genitourinary:  _39 Chronic kidney disease   _40 Difficult urination  _41 Frequent urination  _42 Burning with urination   _43 Hematuria Skin:  _44 Rashes   _45 Ulcers   _46 Wounds Psychological:  _47 History of anxiety   _48  History of major depression.    Physical Exam BP 118/86   Pulse 89   Resp 16   Ht _49  (1.575 m)   Wt 196 lb (88.9 kg)   LMP 10/25/2011   BMI 35.85 kg/m  Gen:  WD/WN, NAD Head: [place]/AT, No temporalis wasting. Prominent temp pulse not noted. Ear/Nose/Throat: Hearing grossly intact, nares w/o erythema or drainage, oropharynx w/o Erythema/Exudate Eyes: Conjunctiva clear, sclera non-icteric  Neck: trachea midline.  No bruit or JVD.  Pulmonary:  Good air movement, clear to auscultation bilaterally.  Cardiac: RRR, normal S1, S2, no Murmurs, rubs or gallops. Vascular: port present in right subclavicular region Vessel Right Left  Radial Palpable Palpable                                   Gastrointestinal: soft, non-tender/non-distended. Surgical scars well healed Musculoskeletal: M/S 5/5 throughout.  Extremities without ischemic changes.  No deformity or atrophy.  Neurologic: Sensation grossly intact in extremities.  Symmetrical.  Speech is fluent. Motor exam as listed above. Psychiatric: Judgment intact, Mood & affect appropriate for pt's clinical situation. Dermatologic: No rashes or ulcers noted.  No cellulitis or open wounds. Lymph : No Cervical, Axillary, or Inguinal lymphadenopathy.   Radiology Ct Abdomen Pelvis W Contrast  Result Date: 10/15/2016 CLINICAL DATA:  Evaluate rectal cancer EXAM: CT ABDOMEN AND PELVIS WITH CONTRAST TECHNIQUE: Multidetector CT imaging of the abdomen and pelvis was performed using the standard protocol following bolus administration of intravenous contrast. CONTRAST:  131m ISOVUE-300 IOPAMIDOL  (ISOVUE-300) INJECTION 61% COMPARISON:  07/09/2016 FINDINGS: Lower chest: No acute abnormality. Hepatobiliary: No focal liver abnormality is seen. Status post cholecystectomy. No biliary dilatation. Pancreas: Unremarkable. No pancreatic ductal dilatation or surrounding inflammatory changes. Spleen: Normal in size without focal abnormality. Adrenals/Urinary Tract: Adrenal glands are unremarkable. Kidneys are normal, without renal calculi, focal lesion, or hydronephrosis. Bladder is unremarkable. Stomach/Bowel: The stomach appears normal. The small bowel loops have a normal caliber. No obstruction. Status post low anterior resection. Left lower quadrant colostomy is identified. Parastomal hernia containing fat is noted. There is mild wall thickening and intramural fatty deposition involving distal colon, image 64 of series 2. Vascular/Lymphatic: Normal appearance of the abdominal aorta. No enlarged retroperitoneal or mesenteric adenopathy. No  enlarged pelvic or inguinal lymph nodes. Reproductive: Status post hysterectomy. No adnexal masses. Other: Periumbilical hernia contains fat only. No free fluid or fluid collections. Musculoskeletal: No aggressive lytic or sclerotic bone lesions. IMPRESSION: 1. No acute findings identified within the abdomen or pelvis. 2. No specific features identified to suggest recurrent rectal cancer or metastatic disease. 3. Left lower quadrant colostomy with fat containing parastomal hernia. Evidence of mild chronic inflammation of the distal colon is noted up to the level of the ostomy. Electronically Signed   By: Kerby Moors M.D.   On: 10/15/2016 09:46    Labs Recent Results (from the past 2160 hour(s))  ANA     Status: None   Collection Time: 09/09/16  2:36 PM  Result Value Ref Range   Anit Nuclear Antibody(ANA) NEG NEGATIVE  Anti-Smith antibody     Status: None   Collection Time: 09/09/16  2:36 PM  Result Value Ref Range   ENA SM Ab Ser-aCnc <1.0 NEG <1.0 NEG AI    Ferritin     Status: None   Collection Time: 09/09/16  2:36 PM  Result Value Ref Range   Ferritin 71.9 10.0 - 291.0 ng/mL  Hepatic function panel     Status: None   Collection Time: 09/09/16  2:36 PM  Result Value Ref Range   Total Bilirubin 0.3 0.2 - 1.2 mg/dL   Bilirubin, Direct 0.0 0.0 - 0.3 mg/dL   Alkaline Phosphatase 85 39 - 117 U/L   AST 17 0 - 37 U/L   ALT 29 0 - 35 U/L   Total Protein 7.1 6.0 - 8.3 g/dL   Albumin 4.2 3.5 - 5.2 g/dL  Hepatitis B core antibody, total     Status: None   Collection Time: 09/09/16  2:36 PM  Result Value Ref Range   Hep B Core Total Ab NON REACTIVE NON REACTIVE  Hepatitis B surface antibody     Status: Abnormal   Collection Time: 09/09/16  2:36 PM  Result Value Ref Range   Hep B S Ab INDETER (A) NEGATIVE    Comment: Result repeated and verified. Unable to determine if anti-HBs is present at levels consistent with immunity.  Patient's immune status should be further assessed by considering other clinical information.   Hepatitis B surface antigen     Status: None   Collection Time: 09/09/16  2:36 PM  Result Value Ref Range   Hepatitis B Surface Ag NEGATIVE NEGATIVE  Hepatitis C antibody     Status: None   Collection Time: 09/09/16  2:36 PM  Result Value Ref Range   HCV Ab NEGATIVE NEGATIVE  Iron and TIBC     Status: None   Collection Time: 09/09/16  2:36 PM  Result Value Ref Range   Iron 48 40 - 190 ug/dL   UIBC 355 125 - 400 ug/dL   TIBC 403 250 - 450 ug/dL   %SAT 12 11 - 50 %  CBC with Differential/Platelet     Status: None   Collection Time: 09/09/16  2:36 PM  Result Value Ref Range   WBC 5.4 4.0 - 10.5 K/uL   RBC 4.48 3.87 - 5.11 Mil/uL   Hemoglobin 13.1 12.0 - 15.0 g/dL   HCT 38.8 36.0 - 46.0 %   MCV 86.6 78.0 - 100.0 fl   MCHC 33.6 30.0 - 36.0 g/dL   RDW 15.1 11.5 - 15.5 %   Platelets 242.0 150.0 - 400.0 K/uL   Neutrophils Relative % 52.3 43.0 - 77.0 %  Lymphocytes Relative 40.0 12.0 - 46.0 %   Monocytes Relative 5.8  3.0 - 12.0 %   Eosinophils Relative 1.3 0.0 - 5.0 %   Basophils Relative 0.6 0.0 - 3.0 %   Neutro Abs 2.8 1.4 - 7.7 K/uL   Lymphs Abs 2.2 0.7 - 4.0 K/uL   Monocytes Absolute 0.3 0.1 - 1.0 K/uL   Eosinophils Absolute 0.1 0.0 - 0.7 K/uL   Basophils Absolute 0.0 0.0 - 0.1 K/uL  CBC with Differential/Platelet     Status: Abnormal   Collection Time: 10/23/16 10:02 AM  Result Value Ref Range   WBC 6.1 3.6 - 11.0 K/uL   RBC 4.72 3.80 - 5.20 MIL/uL   Hemoglobin 13.6 12.0 - 16.0 g/dL   HCT 40.1 35.0 - 47.0 %   MCV 85.0 80.0 - 100.0 fL   MCH 28.8 26.0 - 34.0 pg   MCHC 33.8 32.0 - 36.0 g/dL   RDW 15.3 (H) 11.5 - 14.5 %   Platelets 216 150 - 440 K/uL   Neutrophils Relative % 55 %   Neutro Abs 3.4 1.4 - 6.5 K/uL   Lymphocytes Relative 37 %   Lymphs Abs 2.2 1.0 - 3.6 K/uL   Monocytes Relative 6 %   Monocytes Absolute 0.4 0.2 - 0.9 K/uL   Eosinophils Relative 1 %   Eosinophils Absolute 0.1 0 - 0.7 K/uL   Basophils Relative 1 %   Basophils Absolute 0.0 0 - 0.1 K/uL  Comprehensive metabolic panel     Status: Abnormal   Collection Time: 10/23/16 10:02 AM  Result Value Ref Range   Sodium 136 135 - 145 mmol/L   Potassium 3.9 3.5 - 5.1 mmol/L   Chloride 102 101 - 111 mmol/L   CO2 22 22 - 32 mmol/L   Glucose, Bld 104 (H) 65 - 99 mg/dL   BUN 15 6 - 20 mg/dL   Creatinine, Ser 0.94 0.44 - 1.00 mg/dL   Calcium 9.7 8.9 - 10.3 mg/dL   Total Protein 7.9 6.5 - 8.1 g/dL   Albumin 4.4 3.5 - 5.0 g/dL   AST 27 15 - 41 U/L   ALT 35 14 - 54 U/L   Alkaline Phosphatase 84 38 - 126 U/L   Total Bilirubin 0.9 0.3 - 1.2 mg/dL   GFR calc non Af Amer >60 >60 mL/min   GFR calc Af Amer >60 >60 mL/min    Comment: (NOTE) The eGFR has been calculated using the CKD EPI equation. This calculation has not been validated in all clinical situations. eGFR's persistently <60 mL/min signify possible Chronic Kidney Disease.    Anion gap 12 5 - 15  CEA     Status: None   Collection Time: 10/23/16 10:02 AM  Result  Value Ref Range   CEA 1.1 0.0 - 4.7 ng/mL    Comment: (NOTE)       Roche ECLIA methodology       Nonsmokers  <3.9                                     Smokers     <5.6 Performed At: [facility] [place], [place] [id number] Lindon Romp MD EX:[id number]     Assessment/Plan:  Cholelithiasis Had a cholecystectomy last year  Rectal cancer [facility]) Currently no evidence of disease and no longer needs her port. We discussed that if she ever needs a port in  the future this could certainly be replaced without difficulty. She voices her understanding and desires to have her port removed. Risks and benefits were discussed and this will be scheduled at her convenience in the near future.      Leotis Pain 11/04/2016, 12:31 PM   This note was created with Dragon medical transcription system.  Any errors from dictation are unintentional.

## 2016-11-04 NOTE — Assessment & Plan Note (Signed)
Currently no evidence of disease and no longer needs her port. We discussed that if she ever needs a port in the future this could certainly be replaced without difficulty. She voices her understanding and desires to have her port removed. Risks and benefits were discussed and this will be scheduled at her convenience in the near future.

## 2016-11-05 ENCOUNTER — Other Ambulatory Visit (INDEPENDENT_AMBULATORY_CARE_PROVIDER_SITE_OTHER): Payer: Self-pay

## 2016-11-06 ENCOUNTER — Encounter
Admission: RE | Disposition: A | Discharge status: Home / Self Care | Payer: Self-pay | Source: Ambulatory Visit | Attending: Vascular Surgery

## 2016-11-06 ENCOUNTER — Ambulatory Visit
Admission: RE | Admit: 2016-11-06 | Discharge: 2016-11-06 | Disposition: A | Discharge status: Home / Self Care | Payer: BLUE CROSS/BLUE SHIELD | Source: Ambulatory Visit | Attending: Vascular Surgery | Admitting: Vascular Surgery

## 2016-11-06 DIAGNOSIS — Z452 Encounter for adjustment and management of vascular access device: Secondary | ICD-10-CM | POA: Diagnosis not present

## 2016-11-06 DIAGNOSIS — K8020 Calculus of gallbladder without cholecystitis without obstruction: Secondary | ICD-10-CM | POA: Insufficient documentation

## 2016-11-06 DIAGNOSIS — Z933 Colostomy status: Secondary | ICD-10-CM | POA: Diagnosis not present

## 2016-11-06 DIAGNOSIS — Z9221 Personal history of antineoplastic chemotherapy: Secondary | ICD-10-CM | POA: Insufficient documentation

## 2016-11-06 DIAGNOSIS — Z8249 Family history of ischemic heart disease and other diseases of the circulatory system: Secondary | ICD-10-CM | POA: Insufficient documentation

## 2016-11-06 DIAGNOSIS — Z9049 Acquired absence of other specified parts of digestive tract: Secondary | ICD-10-CM | POA: Diagnosis not present

## 2016-11-06 DIAGNOSIS — C20 Malignant neoplasm of rectum: Secondary | ICD-10-CM | POA: Diagnosis not present

## 2016-11-06 DIAGNOSIS — Z90710 Acquired absence of both cervix and uterus: Secondary | ICD-10-CM | POA: Diagnosis not present

## 2016-11-06 HISTORY — PX: PORTA CATH REMOVAL: CATH118286

## 2016-11-06 SURGERY — PORTA CATH REMOVAL
Anesthesia: Moderate Sedation

## 2016-11-06 MED ORDER — LIDOCAINE-EPINEPHRINE (PF) 1 %-1:200000 IJ SOLN
INTRAMUSCULAR | Status: AC
  Filled 2016-11-06: qty 30

## 2016-11-06 MED ORDER — FENTANYL CITRATE (PF) 100 MCG/2ML IJ SOLN
INTRAMUSCULAR | Status: AC
  Filled 2016-11-06: qty 2

## 2016-11-06 MED ORDER — MIDAZOLAM HCL 2 MG/2ML IJ SOLN
INTRAMUSCULAR | Status: DC | PRN
  Administered 2016-11-06: 2 mg via INTRAVENOUS

## 2016-11-06 MED ORDER — SODIUM CHLORIDE 0.9 % IV SOLN
INTRAVENOUS | Status: DC
  Administered 2016-11-06: 10:00:00 via INTRAVENOUS

## 2016-11-06 MED ORDER — MIDAZOLAM HCL 2 MG/2ML IJ SOLN
INTRAMUSCULAR | Status: AC
Start: 2016-11-06 — End: 2016-11-06
  Filled 2016-11-06: qty 2

## 2016-11-06 MED ORDER — FENTANYL CITRATE (PF) 100 MCG/2ML IJ SOLN
INTRAMUSCULAR | Status: DC | PRN
  Administered 2016-11-06: 50 ug via INTRAVENOUS

## 2016-11-06 SURGICAL SUPPLY — 8 items
ADH SKN CLS APL DERMABOND .7 (GAUZE/BANDAGES/DRESSINGS) ×1
DERMABOND ADVANCED (GAUZE/BANDAGES/DRESSINGS) ×1
DERMABOND ADVANCED .7 DNX12 (GAUZE/BANDAGES/DRESSINGS) IMPLANT
PACK ANGIOGRAPHY (CUSTOM PROCEDURE TRAY) ×1 IMPLANT
PENCIL ELECTRO HAND CTR (MISCELLANEOUS) ×1 IMPLANT
SUT MNCRL AB 4-0 PS2 18 (SUTURE) ×1 IMPLANT
SUTURE VIC 3-0 (SUTURE) ×1 IMPLANT
TOWEL OR 17X26 4PK STRL BLUE (TOWEL DISPOSABLE) ×1 IMPLANT

## 2016-11-06 NOTE — Discharge Instructions (Signed)
Incision Care, Adult °An incision is a cut that a doctor makes in your skin for surgery (for a procedure). Most times, these cuts are closed after surgery. Your cut from surgery may be closed with stitches (sutures), staples, skin glue, or skin tape (adhesive strips). You may need to return to your doctor to have stitches or staples taken out. This may happen many days or many weeks after your surgery. The cut needs to be well cared for so it does not get infected. °How to care for your cut °Cut care °· Follow instructions from your doctor about how to take care of your cut. Make sure you: °? Wash your hands with soap and water before you change your bandage (dressing). If you cannot use soap and water, use hand sanitizer. °? Change your bandage as told by your doctor. °? Leave stitches, skin glue, or skin tape in place. They may need to stay in place for 2 weeks or longer. If tape strips get loose and curl up, you may trim the loose edges. Do not remove tape strips completely unless your doctor says it is okay. °· Check your cut area every day for signs of infection. Check for: °? More redness, swelling, or pain. °? More fluid or blood. °? Warmth. °? Pus or a bad smell. °· Ask your doctor how to clean the cut. This may include: °? Using mild soap and water. °? Using a clean towel to pat the cut dry after you clean it. °? Putting a cream or ointment on the cut. Do this only as told by your doctor. °? Covering the cut with a clean bandage. °· Ask your doctor when you can leave the cut uncovered. °· Do not take baths, swim, or use a hot tub until your doctor says it is okay. Ask your doctor if you can take showers. You may only be allowed to take sponge baths for bathing. °Medicines °· If you were prescribed an antibiotic medicine, cream, or ointment, take the antibiotic or put it on the cut as told by your doctor. Do not stop taking or putting on the antibiotic even if your condition gets better. °· Take  over-the-counter and prescription medicines only as told by your doctor. °General instructions °· Limit movement around your cut. This helps healing. °? Avoid straining, lifting, or exercise for the first month, or for as long as told by your doctor. °? Follow instructions from your doctor about going back to your normal activities. °? Ask your doctor what activities are safe. °· Protect your cut from the sun when you are outside for the first 6 months, or for as long as told by your doctor. Put on sunscreen around the scar or cover up the scar. °· Keep all follow-up visits as told by your doctor. This is important. °Contact a doctor if: °· Your have more redness, swelling, or pain around the cut. °· You have more fluid or blood coming from the cut. °· Your cut feels warm to the touch. °· You have pus or a bad smell coming from the cut. °· You have a fever or shaking chills. °· You feel sick to your stomach (nauseous) or you throw up (vomit). °· You are dizzy. °· Your stitches or staples come undone. °Get help right away if: °· You have a red streak coming from your cut. °· Your cut bleeds through the bandage and the bleeding does not stop with gentle pressure. °· The edges of your cut open   up and separate. °· You have very bad (severe) pain. °· You have a rash. °· You are confused. °· You pass out (faint). °· You have trouble breathing and you have a fast heartbeat. °This information is not intended to replace advice given to you by your health care provider. Make sure you discuss any questions you have with your health care provider. °Document Released: 12/08/2011 Document Revised: 05/23/2016 Document Reviewed: 05/23/2016 °Elsevier Interactive Patient Education © 2017 [employer] ° °

## 2016-11-06 NOTE — Op Note (Signed)
[facility]       Operative Note  Date: 11/06/2016  Preoperative diagnosis:  1. Rectal cancer, completed treatment with NED no longer using port  Postoperative diagnosis:  Same as above  Procedures: #1. Removal of right jugular port a cath   Surgeon: Leotis Pain, MD  Anesthesia: Local with moderate conscious sedation for 15 minutes using 2 mg of Versed and 50 mcg of Fentanyl  Fluoroscopy time: none  Contrast used: 0  Estimated blood loss: Minimal  Indication for the procedure:  The patient is a 31 y.o. female who has completed her therapy for rectal cancer and no longer needs their Port-A-Cath. The patient desires to have this removed. Risks and benefits including need for potential replacement with recurrent disease were discussed and patient is agreeable to proceed.  Description of procedure: The patient was brought to the vascular and interventional radiology suite. Moderate conscious sedation was administered during a face to face encounter with the patient throughout the procedure with my supervision of the RN administering medicines and monitoring the patient's vital signs, pulse oximetry, telemetry and mental status throughout from the start of the procedure until the patient was taken to the recovery room.  The right neck chest and shoulder were sterilely prepped and draped, and a sterile surgical field was created. The area was then anesthetized with 1% lidocaine copiously. The previous incision was reopened and electrocautery used to dissected down to the port and the catheter. These were dissected free and the catheter was gently removed from the vein in its entirety. The port was dissected out from the fibrous connective tissue and the Prolene sutures were removed. The port was then removed in its entirety including the catheter. The wound was then closed with a 3-0 Vicryl and a 4-0 Monocryl and Dermabond was placed as  a dressing. The patient was then taken to the recovery room in stable condition having tolerated the procedure well.  Complications: none  Condition: stable   Leotis Pain, MD 11/06/2016 11:37 AM   This note was created with Dragon Medical transcription system. Any errors in dictation are purely unintentional.

## 2016-11-06 NOTE — H&P (Signed)
[facility] History & Physical Update  The patient was interviewed and re-examined.  The patient's previous History and Physical has been reviewed and is unchanged.  There is no change in the plan of care. We plan to proceed with the scheduled procedure.  Leotis Pain, MD  11/06/2016, 10:03 AM

## 2016-11-06 NOTE — Progress Notes (Signed)
Patient clinically stable post port removal. Grandmother present. Vitals stable. Alert and oriented. Taking po's without difficulty. sr per monitor. Dr Lucky Cowboy out to speak with patient and family with questions answered. Discharge teaching done with patient with questions answered.

## 2016-11-07 ENCOUNTER — Encounter: Payer: Self-pay | Admitting: Vascular Surgery

## 2016-11-11 DIAGNOSIS — F321 Major depressive disorder, single episode, moderate: Secondary | ICD-10-CM | POA: Diagnosis not present

## 2016-12-10 ENCOUNTER — Ambulatory Visit (INDEPENDENT_AMBULATORY_CARE_PROVIDER_SITE_OTHER): Payer: BLUE CROSS/BLUE SHIELD | Admitting: Internal Medicine

## 2016-12-10 ENCOUNTER — Encounter: Payer: Self-pay | Admitting: Internal Medicine

## 2016-12-10 VITALS — BP 100/62 | HR 69 | Resp 16 | Ht 62.0 in | Wt 193.4 lb

## 2016-12-10 DIAGNOSIS — Z6835 Body mass index (BMI) 35.0-35.9, adult: Secondary | ICD-10-CM | POA: Diagnosis not present

## 2016-12-10 DIAGNOSIS — E7800 Pure hypercholesterolemia, unspecified: Secondary | ICD-10-CM

## 2016-12-10 DIAGNOSIS — F5112 Insufficient sleep syndrome: Secondary | ICD-10-CM

## 2016-12-10 DIAGNOSIS — IMO0001 Reserved for inherently not codable concepts without codable children: Secondary | ICD-10-CM

## 2016-12-10 DIAGNOSIS — G4733 Obstructive sleep apnea (adult) (pediatric): Secondary | ICD-10-CM | POA: Diagnosis not present

## 2016-12-10 DIAGNOSIS — E6609 Other obesity due to excess calories: Secondary | ICD-10-CM | POA: Diagnosis not present

## 2016-12-10 DIAGNOSIS — K760 Fatty (change of) liver, not elsewhere classified: Secondary | ICD-10-CM

## 2016-12-10 MED ORDER — METHYLPHENIDATE HCL ER (CD) 30 MG PO CPCR: 30.0000 mg | ORAL_CAPSULE | Freq: Two times a day (BID) | ORAL | 0 refills | Status: DC

## 2016-12-10 MED ORDER — LIRAGLUTIDE -WEIGHT MANAGEMENT 18 MG/3ML ~~LOC~~ SOPN: 0.6000 mg | PEN_INJECTOR | Freq: Every day | SUBCUTANEOUS | 0 refills | Status: DC

## 2016-12-10 NOTE — Patient Instructions (Addendum)
. I appreciate your concern about continuing your PPI in light of the recently published studies suggesting an association with increased risk of dementia and kidney failure.  I advise you to try switching  From your PPI to either famotidine 20 mg once or twice daily,  or to  ranitidine 150 mg once or twice daily.  These medications are  H2 blockers and are available without a prescriptions.   if your reflux symptoms are controlled,  You can Continue the daily h2 blocker.    I am recommending use of the medication called Saxenda to help you lose and  weight.  It is similar to a a medicine that is used to treat diabetes called Victoza,  So It may lower your blood sugars .   It is injected daily in incrementally increasing doses (if tolerated,  Nausea usually resolves in a few days)"  0.6 mg daily   Week 1 1.2 mg daily Week 2 1.8 mg  Daly Week 3 2.4 mg daily Week 4 3.0 mg daily Week 5 and ongoing   If you want to bring the pen with you to be shown how to give yourself the dose,  We wil make you an  RN visit once you pick up your medication from your pharmacy   You will be due for fasting labs in July,  please make an appt now for labs.  I will see you again in August   Liraglutide injection (Weight Management) What is this medicine? LIRAGLUTIDE (LIR a GLOO tide) is used with a reduced calorie diet and exercise to help you lose weight. This medicine may be used for other purposes; ask your health care provider or pharmacist if you have questions. COMMON BRAND NAME(S): Saxenda What should I tell my health care provider before I take this medicine? They need to know if you have any of these conditions: -endocrine tumors (MEN 2) or if someone in your family had these tumors -gallbladder disease -high cholesterol -history of alcohol abuse problem -history of pancreatitis -kidney disease or if you are on dialysis -liver disease -previous swelling of the tongue, face, or lips with difficulty  breathing, difficulty swallowing, hoarseness, or tightening of the throat -stomach problems -suicidal thoughts, plans, or attempt; a previous suicide attempt by you or a family member -thyroid cancer or if someone in your family had thyroid cancer -an unusual or allergic reaction to liraglutide, other medicines, foods, dyes, or preservatives -pregnant or trying to get pregnant -breast-feeding How should I use this medicine? This medicine is for injection under the skin of your upper leg, stomach area, or upper arm. You will be taught how to prepare and give this medicine. Use exactly as directed. Take your medicine at regular intervals. Do not take it more often than directed. It is important that you put your used needles and syringes in a special sharps container. Do not put them in a trash can. If you do not have a sharps container, call your pharmacist or healthcare provider to get one. A special MedGuide will be given to you by the pharmacist with each prescription and refill. Be sure to read this information carefully each time. Talk to your pediatrician regarding the use of this medicine in children. Special care may be needed. Overdosage: If you think you have taken too much of this medicine contact a poison control center or emergency room at once. NOTE: This medicine is only for you. Do not share this medicine with others. What if I  miss a dose? If you miss a dose, take it as soon as you can. If it is almost time for your next dose, take only that dose. Do not take double or extra doses. If you miss your dose for 3 days or more, call your doctor or health care professional to talk about how to restart this medicine. What may interact with this medicine? -insulin and other medicines for diabetes This list may not describe all possible interactions. Give your health care provider a list of all the medicines, herbs, non-prescription drugs, or dietary supplements you use. Also tell them if  you smoke, drink alcohol, or use illegal drugs. Some items may interact with your medicine. What should I watch for while using this medicine? Visit your doctor or health care professional for regular checks on your progress. This medicine is intended to be used in addition to a healthy diet and appropriate exercise. The best results are achieved this way. Do not increase or in any way change your dose without consulting your doctor or health care professional. Drink plenty of fluids while taking this medicine. Check with your doctor or health care professional if you get an attack of severe diarrhea, nausea, and vomiting. The loss of too much body fluid can make it dangerous for you to take this medicine. This medicine may affect blood sugar levels. If you have diabetes, check with your doctor or health care professional before you change your diet or the dose of your diabetic medicine. Patients and their families should watch out for worsening depression or thoughts of suicide. Also watch out for sudden changes in feelings such as feeling anxious, agitated, panicky, irritable, hostile, aggressive, impulsive, severely restless, overly excited and hyperactive, or not being able to sleep. If this happens, especially at the beginning of treatment or after a change in dose, call your health care professional. What side effects may I notice from receiving this medicine? Side effects that you should report to your doctor or health care professional as soon as possible: -allergic reactions like skin rash, itching or hives, swelling of the face, lips, or tongue -breathing problems -diarrhea that continues or is severe -lump or swelling on the neck -severe nausea -signs and symptoms of infection like fever or chills; cough; sore throat; pain or trouble passing urine -signs and symptoms of low blood sugar such as feeling anxious, confusion, dizziness, increased hunger, unusually weak or tired, sweating,  shakiness, cold, irritable, headache, blurred vision, fast heartbeat, loss of consciousness -signs and symptoms of kidney injury like trouble passing urine or change in the amount of urine -trouble swallowing -unusual stomach upset or pain -vomiting Side effects that usually do not require medical attention (report to your doctor or health care professional if they continue or are bothersome): -constipation -decreased appetite -diarrhea -fatigue -headache -nausea -pain, redness, or irritation at site where injected -stomach upset -stuffy or runny nose This list may not describe all possible side effects. Call your doctor for medical advice about side effects. You may report side effects to FDA at 1-800-FDA-1088. Where should I keep my medicine? Keep out of the reach of children. Store unopened pen in a refrigerator between 2 and 8 degrees C (36 and 46 degrees F). Do not freeze or use if the medicine has been frozen. Protect from light and excessive heat. After you first use the pen, it can be stored at room temperature between 15 and 30 degrees C (59 and 86 degrees F) or in a refrigerator. Throw  away your used pen after 30 days or after the expiration date, whichever comes first. Do not store your pen with the needle attached. If the needle is left on, medicine may leak from the pen. NOTE: This sheet is a summary. It may not cover all possible information. If you have questions about this medicine, talk to your doctor, pharmacist, or health care provider.  2018 Elsevier/Gold Standard (2016-10-02 14:41:37)

## 2016-12-10 NOTE — Progress Notes (Signed)
Pre visit review using our clinic review tool, if applicable. No additional management support is needed unless otherwise documented below in the visit note. 

## 2016-12-10 NOTE — Progress Notes (Signed)
Subjective:  Patient ID: Rachael Cowan, female    DOB: 1985/10/01  Age: 31 y.o. MRN: [id number]  CC: The primary encounter diagnosis was Pure hypercholesterolemia. Diagnoses of Obstructive apnea, Hepatic steatosis, Primary hypersomnolence disorder, and Class 2 obesity due to excess calories with serious comorbidity and body mass index (BMI) of 35.0 to 35.9 in adult were also pertinent to this visit.  HPI Rachael Cowan presents for follow up on obesity with on on fatty liver,.  She has lost 3 lbs since last visit 5 weeks ago using a modified diet and exercise.      Low gi diet discussed in detail .  Exercise counselling given  Stopped PPI   taking methylphenidate for narcolepsy saxenda discussed   Outpatient Medications Prior to Visit  Medication Sig Dispense Refill  . cyanocobalamin 500 MCG tablet Take 500 mcg by mouth daily.     Marland Kitchen estrogens, conjugated, (PREMARIN) 0.9 MG tablet Take 1 tablet (0.9 mg total) by mouth daily. 30 tablet 11  . ibuprofen (ADVIL,MOTRIN) 200 MG tablet Take 400 mg by mouth every 6 (six) hours as needed for mild pain.    Marland Kitchen loratadine (CLARITIN) 10 MG tablet Take 10 mg by mouth daily.    . montelukast (SINGULAIR) 10 MG tablet Take 10 mg by mouth daily.     . Multiple Vitamins-Calcium (ONE-A-DAY WOMENS PO) Take 1 tablet by mouth daily.     Marland Kitchen omega-3 acid ethyl esters (LOVAZA) 1 g capsule Take 1 g by mouth 2 (two) times daily.    Marland Kitchen loperamide (IMODIUM A-D) 2 MG tablet Take 2 mg by mouth 4 (four) times daily as needed for diarrhea or loose stools.    . methylphenidate (METADATE CD) 30 MG CR capsule Take 1 capsule (30 mg total) by mouth 2 (two) times daily. In the morning and in the afternoon 60 capsule 0  . pantoprazole (PROTONIX) 40 MG tablet TAKE 1 TABLET (40 MG TOTAL) BY MOUTH ONCE DAILY.  2   No facility-administered medications prior to visit.     Review of Systems;  Patient denies headache, fevers, malaise, unintentional weight loss, skin rash, eye pain,  sinus congestion and sinus pain, sore throat, dysphagia,  hemoptysis , cough, dyspnea, wheezing, chest pain, palpitations, orthopnea, edema, abdominal pain, nausea, melena, diarrhea, constipation, flank pain, dysuria, hematuria, urinary  Frequency, nocturia, numbness, tingling, seizures,  Focal weakness, Loss of consciousness,  Tremor, insomnia, depression, anxiety, and suicidal ideation.      Objective:  BP 100/62 (BP Location: Left Arm, Patient Position: Sitting, Cuff Size: Large)   Pulse 69   Resp 16   Ht 5\' 2"  (1.575 m)   Wt 193 lb 6.4 oz (87.7 kg)   LMP 10/25/2011   SpO2 97%   BMI 35.37 kg/m   BP Readings from Last 3 Encounters:  12/10/16 100/62  11/06/16 106/67  11/04/16 118/86    Wt Readings from Last 3 Encounters:  12/10/16 193 lb 6.4 oz (87.7 kg)  11/06/16 196 lb (88.9 kg)  11/04/16 196 lb (88.9 kg)    General appearance: alert, cooperative and appears stated age Ears: normal TM's and external ear canals both ears Throat: lips, mucosa, and tongue normal; teeth and gums normal Neck: no adenopathy, no carotid bruit, supple, symmetrical, trachea midline and thyroid not enlarged, symmetric, no tenderness/mass/nodules Back: symmetric, no curvature. ROM normal. No CVA tenderness. Lungs: clear to auscultation bilaterally Heart: regular rate and rhythm, S1, S2 normal, no murmur, click, rub or gallop Abdomen: soft, non-tender;  bowel sounds normal; no masses,  no organomegaly Pulses: 2+ and symmetric Skin: Skin color, texture, turgor normal. No rashes or lesions Lymph nodes: Cervical, supraclavicular, and axillary nodes normal.  Lab Results  Component Value Date   HGBA1C 5.6 07/17/2016    Lab Results  Component Value Date   CREATININE 0.94 10/23/2016   CREATININE 0.96 07/21/2016   CREATININE 0.80 07/20/2016    Lab Results  Component Value Date   WBC 6.1 10/23/2016   HGB 13.6 10/23/2016   HCT 40.1 10/23/2016   PLT 216 10/23/2016   GLUCOSE 104 (H) 10/23/2016    CHOL 216 (H) 06/22/2013   TRIG 295.0 (H) 06/22/2013   HDL 85.40 06/22/2013   LDLDIRECT 84.6 06/22/2013   ALT 35 10/23/2016   AST 27 10/23/2016   NA 136 10/23/2016   K 3.9 10/23/2016   CL 102 10/23/2016   CREATININE 0.94 10/23/2016   BUN 15 10/23/2016   CO2 22 10/23/2016   TSH 3.23 06/22/2013   HGBA1C 5.6 07/17/2016    No results found.  Assessment & Plan:   Problem List Items Addressed This Visit    Hepatic steatosis    Presumed by ultrasound changes and negative serologies to rule out autoimmune causes of hepatitis.  Current liver enzymes are normal and all modifiable risk factors including obesity, diabetes and hyperlipidemia have been addressed   Lab Results  Component Value Date   ALT 35 10/23/2016   AST 27 10/23/2016   ALKPHOS 84 10/23/2016   BILITOT 0.9 10/23/2016         Obesity    I have addressed  BMI and recommended a low glycemic index diet utilizing smaller more frequent meals to increase metabolism.  I have also recommended that patient start exercising with a goal of 30 minutes of aerobic exercise a minimum of 5 days per week. Trial of Saxenda recommended.        Relevant Medications   methylphenidate (METADATE CD) 30 MG CR capsule   Liraglutide -Weight Management (SAXENDA) 18 MG/3ML SOPN   RESOLVED: Obstructive apnea    Given her recent weight gain and fatigue we may need to consider repeating her sleep study. Her history of sleep apnea apparently resolved after surgery on her soft palate.      Primary hypersomnolence disorder    Managed with Metadate. Refills given.       Other Visit Diagnoses    Pure hypercholesterolemia    -  Primary   Relevant Orders   Lipid panel      I have discontinued Ms. Tatem pantoprazole and loperamide. I am also having her start on Liraglutide -Weight Management. Additionally, I am having her maintain her loratadine, estrogens (conjugated), cyanocobalamin, montelukast, omega-3 acid ethyl esters, Multiple  Vitamins-Calcium (ONE-A-DAY WOMENS PO), ibuprofen, and methylphenidate.  Meds ordered this encounter  Medications  . DISCONTD: methylphenidate (METADATE CD) 30 MG CR capsule    Sig: Take 1 capsule (30 mg total) by mouth 2 (two) times daily. In the morning and in the afternoon    Dispense:  60 capsule    Refill:  0  . DISCONTD: methylphenidate (METADATE CD) 30 MG CR capsule    Sig: Take 1 capsule (30 mg total) by mouth 2 (two) times daily. In the morning and in the afternoon    Dispense:  60 capsule    Refill:  0    May refill on or after December 29 2016  . methylphenidate (METADATE CD) 30 MG CR capsule  Sig: Take 1 capsule (30 mg total) by mouth 2 (two) times daily. In the morning and in the afternoon    Dispense:  60 capsule    Refill:  0    May refill on or after Jan 28 2017  . Liraglutide -Weight Management (SAXENDA) 18 MG/3ML SOPN    Sig: Inject 0.6 mg into the skin daily. Increase dose weekly as follows: Week 2: 1.2 mg daily ; Week 3: 1.8 mg daily; Week 4: 2.4 mg daily    Dispense:  15 mL    Refill:  0    Medications Discontinued During This Encounter  Medication Reason  . loperamide (IMODIUM A-D) 2 MG tablet Patient has not taken in last 30 days  . pantoprazole (PROTONIX) 40 MG tablet Patient has not taken in last 30 days  . methylphenidate (METADATE CD) 30 MG CR capsule Reorder  . methylphenidate (METADATE CD) 30 MG CR capsule Reorder  . methylphenidate (METADATE CD) 30 MG CR capsule Reorder    Follow-up: Return for 6 months fasting labs  august with Latrel Szymczak .   [name], MD

## 2016-12-12 DIAGNOSIS — Z933 Colostomy status: Secondary | ICD-10-CM | POA: Diagnosis not present

## 2016-12-12 MED ORDER — PEN NEEDLES 31G X 6 MM MISC: 1.0000 "application " | Freq: Every day | 3 refills | Status: DC

## 2016-12-12 NOTE — Assessment & Plan Note (Signed)
Given her recent weight gain and fatigue we may need to consider repeating her sleep study. Her history of sleep apnea apparently resolved after surgery on her soft palate.

## 2016-12-12 NOTE — Addendum Note (Signed)
Addended by: Nanci Pina on: 12/12/2016 12:11 PM   Modules accepted: Orders

## 2016-12-12 NOTE — Assessment & Plan Note (Addendum)
Presumed by ultrasound changes and negative serologies to rule out autoimmune causes of hepatitis.  Current liver enzymes are normal and all modifiable risk factors including obesity, diabetes and hyperlipidemia have been addressed   Lab Results  Component Value Date   ALT 35 10/23/2016   AST 27 10/23/2016   ALKPHOS 84 10/23/2016   BILITOT 0.9 10/23/2016

## 2016-12-12 NOTE — Assessment & Plan Note (Signed)
Managed with Metadate. Refills given.

## 2016-12-12 NOTE — Progress Notes (Signed)
PA started on cover my meds

## 2016-12-12 NOTE — Assessment & Plan Note (Signed)
I have addressed  BMI and recommended a low glycemic index diet utilizing smaller more frequent meals to increase metabolism.  I have also recommended that patient start exercising with a goal of 30 minutes of aerobic exercise a minimum of 5 days per week. Trial of Saxenda recommended.

## 2016-12-19 ENCOUNTER — Telehealth: Payer: Self-pay

## 2016-12-19 NOTE — Telephone Encounter (Signed)
Pt called back returning your call. Thank you!  Call pt @ [phone]

## 2016-12-19 NOTE — Telephone Encounter (Signed)
LMTCB. Need to let pt know that her saxenda rx has been approved by her insurance.

## 2016-12-19 NOTE — Telephone Encounter (Signed)
Patient has been notified

## 2017-01-14 DIAGNOSIS — F321 Major depressive disorder, single episode, moderate: Secondary | ICD-10-CM | POA: Diagnosis not present

## 2017-01-15 ENCOUNTER — Other Ambulatory Visit: Payer: Self-pay | Admitting: Internal Medicine

## 2017-02-11 DIAGNOSIS — Z933 Colostomy status: Secondary | ICD-10-CM | POA: Diagnosis not present

## 2017-02-18 DIAGNOSIS — F321 Major depressive disorder, single episode, moderate: Secondary | ICD-10-CM | POA: Diagnosis not present

## 2017-02-25 ENCOUNTER — Other Ambulatory Visit: Payer: Self-pay | Admitting: Internal Medicine

## 2017-02-25 IMAGING — CT CT ABD-PELV W/ CM
2 of 4 series · 16 of 46 positions shown, 18 images · IV contrast (APPLIED)
Comparison: 07/09/2016

CLINICAL DATA: Evaluate rectal cancer

EXAM:
CT ABDOMEN AND PELVIS WITH CONTRAST
TECHNIQUE: Multidetector CT imaging of the abdomen and pelvis was performed
using the standard protocol following bolus administration of
intravenous contrast.
CONTRAST:  100mL EZ1WDP-IPP IOPAMIDOL (EZ1WDP-IPP) INJECTION 61%

[Series 2: routine abd/pel with · axial · 0.89mm/px · z∈[-590,-130]mm · 13 of 102 slices shown, 15 images]
[im 5/102  soft-tissue]
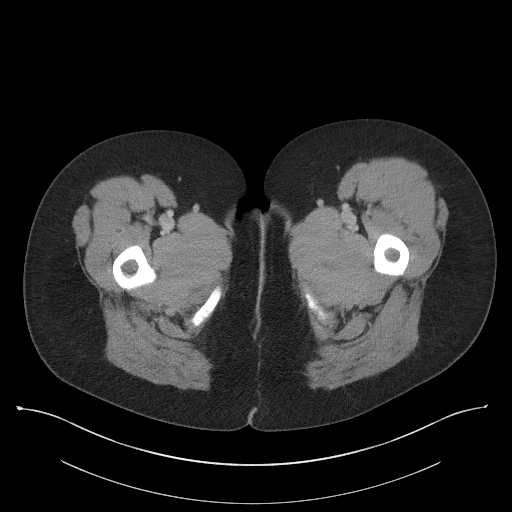
[im 5/102  bone]
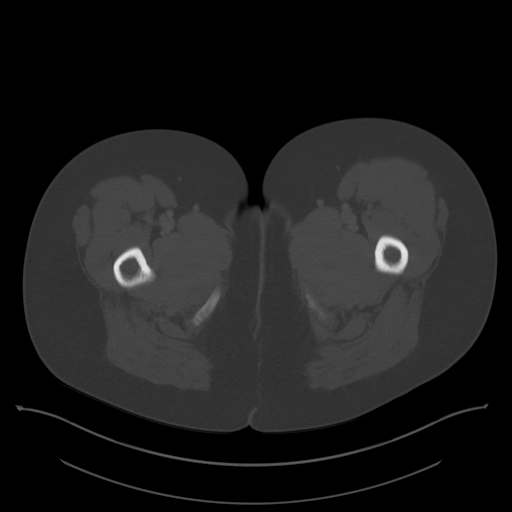
[im 14/102  soft-tissue]
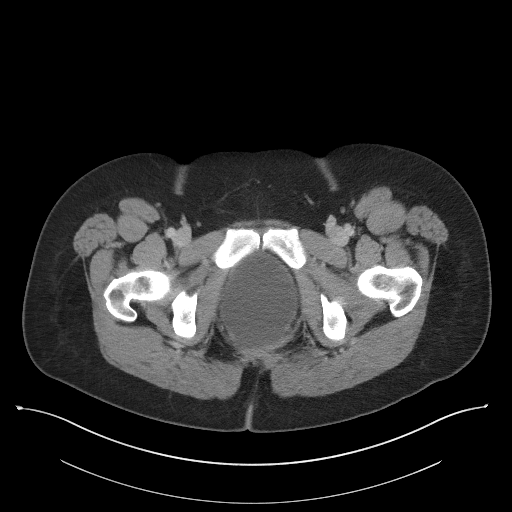
[im 23/102  soft-tissue]
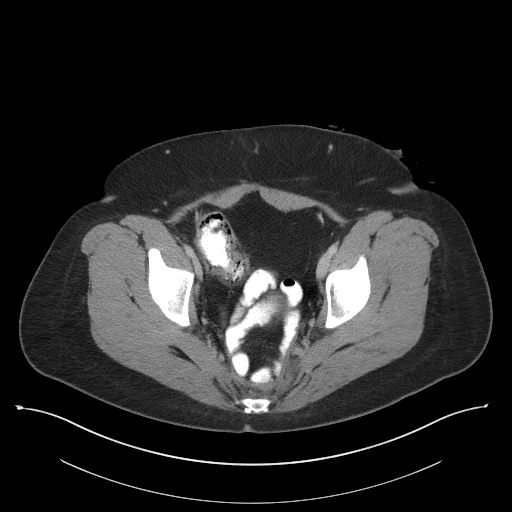
[im 28/102  soft-tissue]
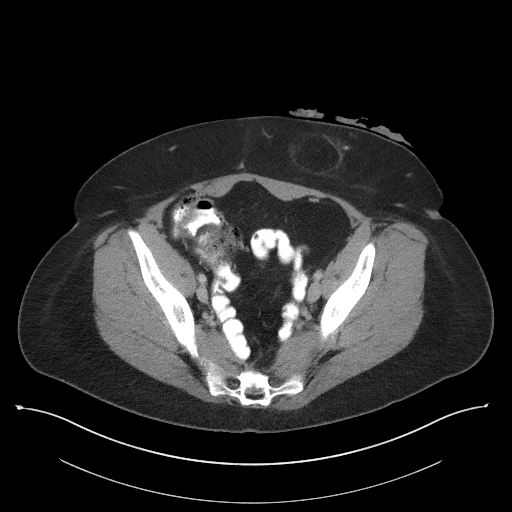
[im 37/102  soft-tissue]
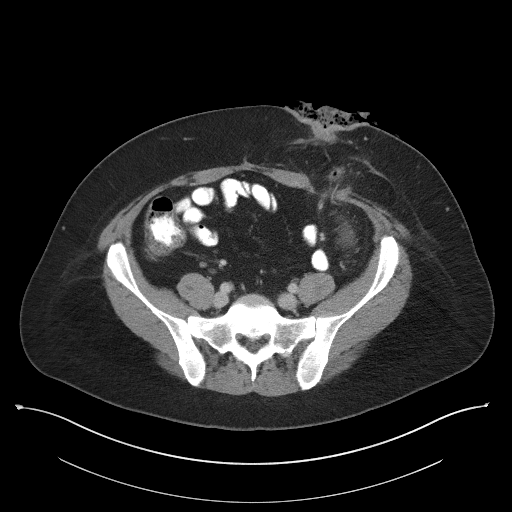
[im 42/102  soft-tissue]
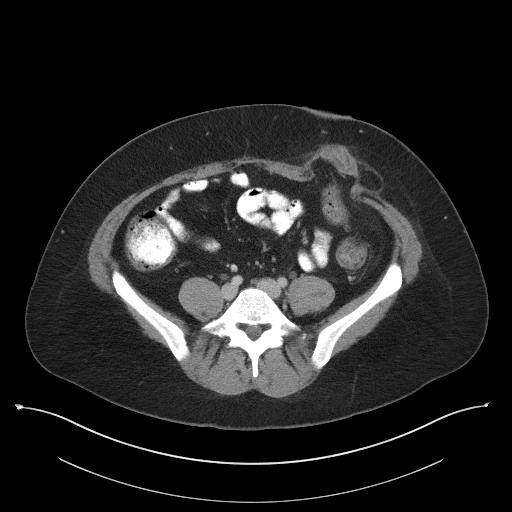
[im 51/102  soft-tissue]
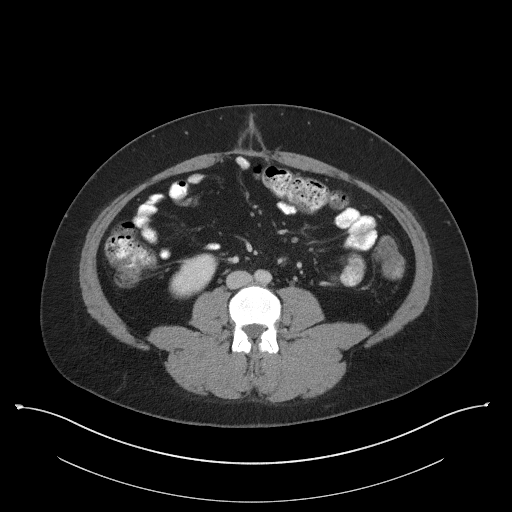
[im 60/102  soft-tissue]
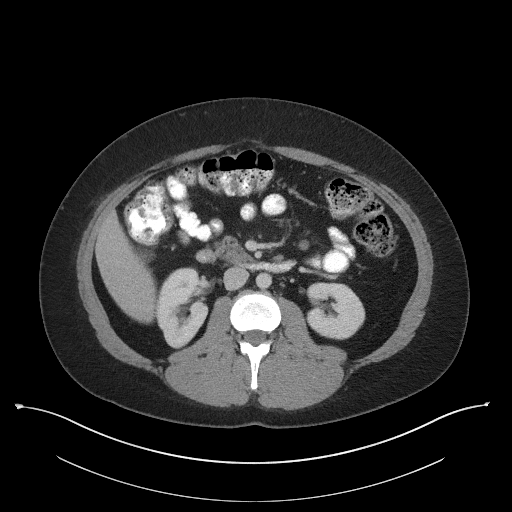
[im 65/102  soft-tissue]
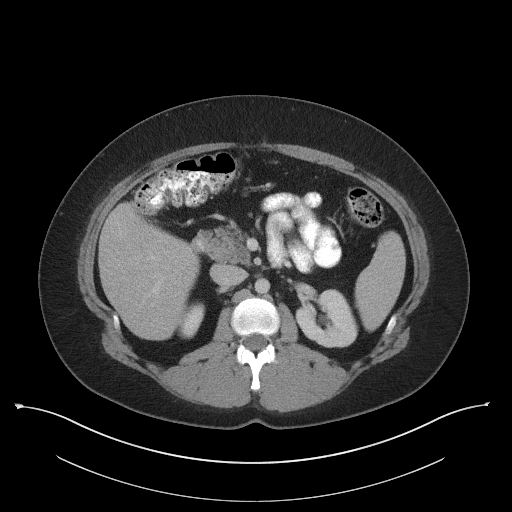
[im 65/102  bone]
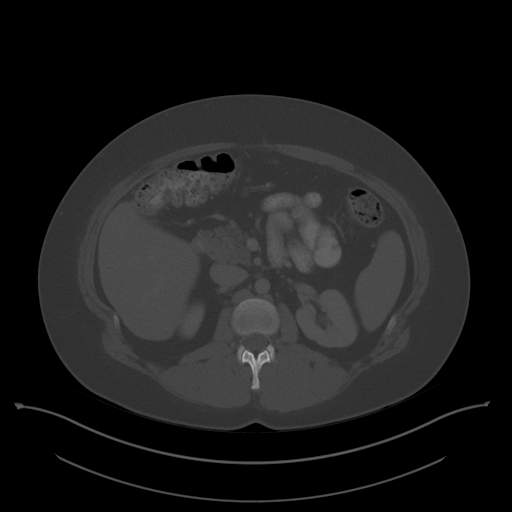
[im 74/102  soft-tissue]
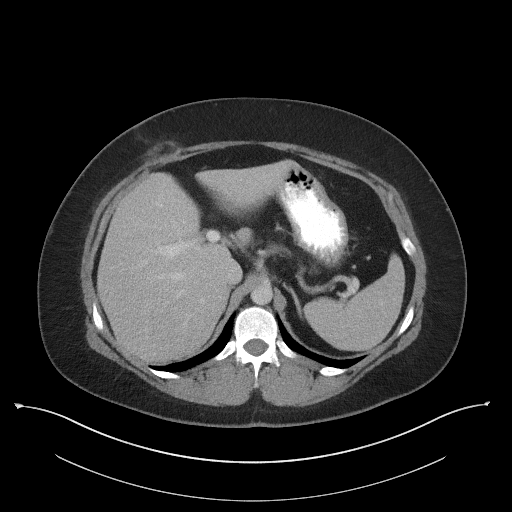
[im 79/102  soft-tissue]
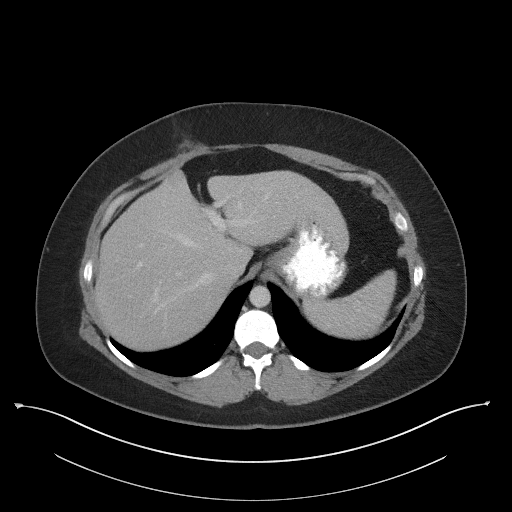
[im 88/102  soft-tissue]
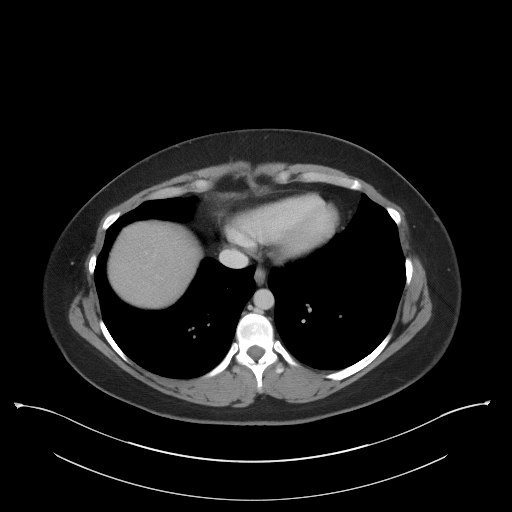
[im 97/102  soft-tissue]
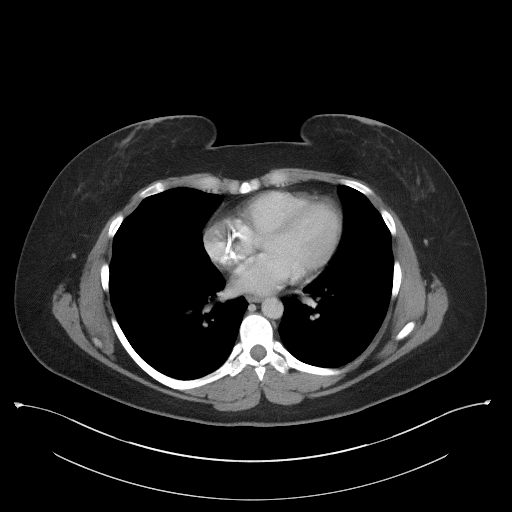

[Series 5: coronal st · coronal · 0.83mm/px · 3 of 88 slices shown]
[im 30/88  soft-tissue]
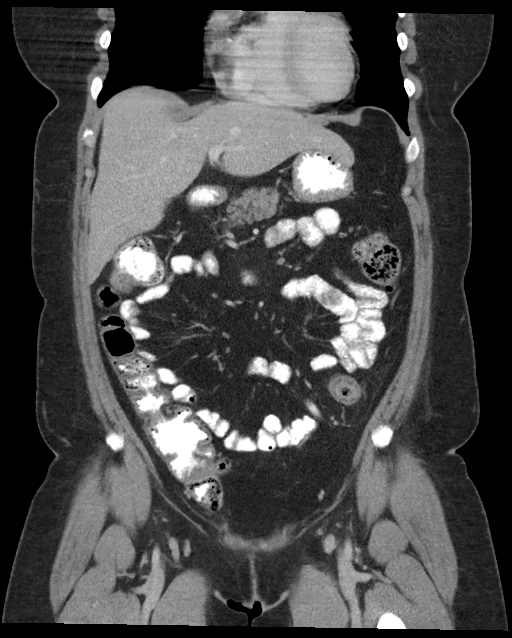
[im 39/88  soft-tissue]
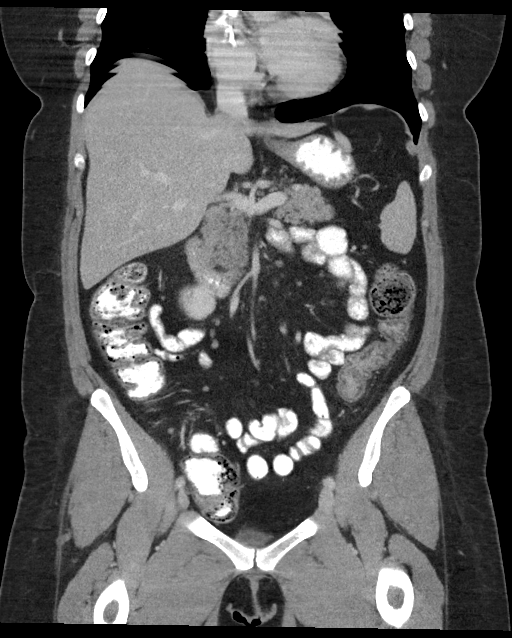
[im 49/88  soft-tissue]
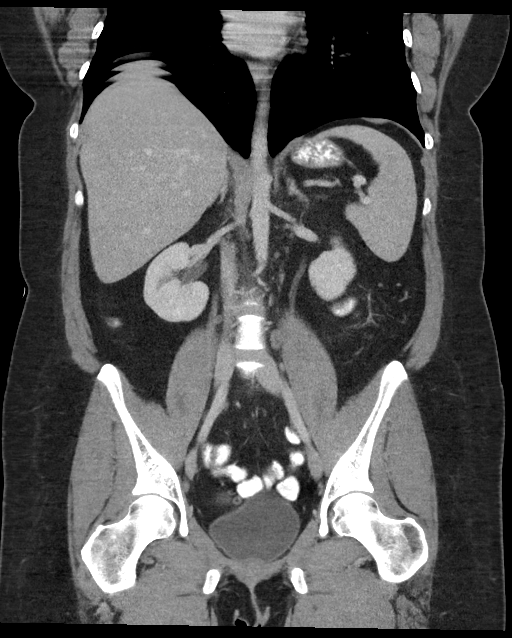

[16 of 46 positions shown; findings below may reference images not displayed]

FINDINGS: Lower chest: No acute abnormality.

Hepatobiliary: No focal liver abnormality is seen. Status post
cholecystectomy. No biliary dilatation.

Pancreas: Unremarkable. No pancreatic ductal dilatation or
surrounding inflammatory changes.

Spleen: Normal in size without focal abnormality.

Adrenals/Urinary Tract: Adrenal glands are unremarkable. Kidneys are
normal, without renal calculi, focal lesion, or hydronephrosis.
Bladder is unremarkable.

Stomach/Bowel: The stomach appears normal. The small bowel loops
have a normal caliber. No obstruction. Status post low anterior
resection. Left lower quadrant colostomy is identified. Parastomal
hernia containing fat is noted. There is mild wall thickening and
intramural fatty deposition involving distal colon, image 64 of
series 2.

Vascular/Lymphatic: Normal appearance of the abdominal aorta. No
enlarged retroperitoneal or mesenteric adenopathy. No enlarged
pelvic or inguinal lymph nodes.

Reproductive: Status post hysterectomy. No adnexal masses.

Other: Periumbilical hernia contains fat only. No free fluid or
fluid collections.

Musculoskeletal: No aggressive lytic or sclerotic bone lesions.
IMPRESSION: 1. No acute findings identified within the abdomen or pelvis.
2. No specific features identified to suggest recurrent rectal
cancer or metastatic disease.
3. Left lower quadrant colostomy with fat containing parastomal
hernia. Evidence of mild chronic inflammation of the distal colon is
noted up to the level of the ostomy.

## 2017-03-09 DIAGNOSIS — Z933 Colostomy status: Secondary | ICD-10-CM | POA: Diagnosis not present

## 2017-04-05 ENCOUNTER — Encounter: Payer: Self-pay | Admitting: Internal Medicine

## 2017-04-06 ENCOUNTER — Telehealth: Payer: Self-pay | Admitting: *Deleted

## 2017-04-06 NOTE — Telephone Encounter (Signed)
PA started on cover my meds

## 2017-04-06 NOTE — Telephone Encounter (Signed)
[employer] has requested a quantity on saxenda per month .  [place] [phone]

## 2017-04-08 ENCOUNTER — Other Ambulatory Visit: Payer: Self-pay | Admitting: Internal Medicine

## 2017-04-08 NOTE — Telephone Encounter (Signed)
Received approval for Methylphenidate twice daily dosing.  Effective 04/06/2017-04/04/2020.

## 2017-04-09 ENCOUNTER — Telehealth: Payer: Self-pay | Admitting: *Deleted

## 2017-04-09 NOTE — Telephone Encounter (Signed)
Pt stated that she is out of town for the next couple of weeks, so we scheduled the pt for a weight check nurse visit for 05/05/2017. Pt is aware of appt date and time.

## 2017-04-09 NOTE — Telephone Encounter (Signed)
[employer] stated that the Rx for Saxenda has been denied. Additional information will be need to be submitted, such as information of pt losing at least 4 % or more of body weight.

## 2017-04-14 DIAGNOSIS — Z933 Colostomy status: Secondary | ICD-10-CM | POA: Diagnosis not present

## 2017-04-22 NOTE — Telephone Encounter (Signed)
Spoke with someone at [employer] to let them know that we have not received the appeal paperwork. She stated that she would be faxing it over again.

## 2017-04-22 NOTE — Telephone Encounter (Signed)
Erin from cover my med[employer] called and was following up to see if we received  an appeal   form for this medication. The provided a phone number if any help was needed [phone] ref # [id number]. They also would need a letter of consent from the patient.

## 2017-06-10 ENCOUNTER — Ambulatory Visit: Payer: BLUE CROSS/BLUE SHIELD

## 2017-06-12 ENCOUNTER — Ambulatory Visit: Payer: BLUE CROSS/BLUE SHIELD | Admitting: Internal Medicine

## 2017-06-17 ENCOUNTER — Inpatient Hospital Stay: Payer: BLUE CROSS/BLUE SHIELD | Attending: Hematology and Oncology | Admitting: Obstetrics and Gynecology

## 2017-06-17 ENCOUNTER — Encounter: Payer: Self-pay | Admitting: Obstetrics and Gynecology

## 2017-06-17 VITALS — BP 113/78 | HR 96 | Temp 98.6°F | Resp 18 | Ht 62.0 in | Wt 205.4 lb

## 2017-06-17 DIAGNOSIS — E119 Type 2 diabetes mellitus without complications: Secondary | ICD-10-CM | POA: Diagnosis not present

## 2017-06-17 DIAGNOSIS — Z90710 Acquired absence of both cervix and uterus: Secondary | ICD-10-CM | POA: Diagnosis not present

## 2017-06-17 DIAGNOSIS — Z85048 Personal history of other malignant neoplasm of rectum, rectosigmoid junction, and anus: Secondary | ICD-10-CM | POA: Diagnosis not present

## 2017-06-17 DIAGNOSIS — E785 Hyperlipidemia, unspecified: Secondary | ICD-10-CM | POA: Diagnosis not present

## 2017-06-17 DIAGNOSIS — Z9221 Personal history of antineoplastic chemotherapy: Secondary | ICD-10-CM

## 2017-06-17 DIAGNOSIS — J45909 Unspecified asthma, uncomplicated: Secondary | ICD-10-CM | POA: Insufficient documentation

## 2017-06-17 DIAGNOSIS — K760 Fatty (change of) liver, not elsewhere classified: Secondary | ICD-10-CM | POA: Insufficient documentation

## 2017-06-17 DIAGNOSIS — E538 Deficiency of other specified B group vitamins: Secondary | ICD-10-CM | POA: Diagnosis not present

## 2017-06-17 DIAGNOSIS — Z794 Long term (current) use of insulin: Secondary | ICD-10-CM | POA: Insufficient documentation

## 2017-06-17 DIAGNOSIS — Z79890 Hormone replacement therapy (postmenopausal): Secondary | ICD-10-CM | POA: Diagnosis not present

## 2017-06-17 DIAGNOSIS — Z01419 Encounter for gynecological examination (general) (routine) without abnormal findings: Secondary | ICD-10-CM | POA: Diagnosis not present

## 2017-06-17 DIAGNOSIS — Z0000 Encounter for general adult medical examination without abnormal findings: Secondary | ICD-10-CM | POA: Insufficient documentation

## 2017-06-17 DIAGNOSIS — Z79899 Other long term (current) drug therapy: Secondary | ICD-10-CM | POA: Insufficient documentation

## 2017-06-17 DIAGNOSIS — E669 Obesity, unspecified: Secondary | ICD-10-CM

## 2017-06-17 DIAGNOSIS — Z0001 Encounter for general adult medical examination with abnormal findings: Secondary | ICD-10-CM | POA: Insufficient documentation

## 2017-06-17 DIAGNOSIS — G4710 Hypersomnia, unspecified: Secondary | ICD-10-CM | POA: Diagnosis not present

## 2017-06-17 NOTE — Progress Notes (Signed)
Gynecologic Oncology Inteval Visit   Referring Provider: Dr. Lequita Asal Oncology   Chief Concern: ERT and gynecologic issues.   Subjective:  Rachael Cowan is a 31 y.o. female who is seen in consultation from Dr. Mike Gip for ERT and gynecologic issues.   She has been doing well over the past year. She is still taking her ERT and tolerating this well. She does not complain of hot flashes. She is not sexually active.  She does not need Pap smears as she has had a hysterectomy.   She missed her appointment for UroGyn and may or may not reschedule as her symptoms have improved. Dr. Mike Gip did not recommend screening mammograms at this point.   Oncology History Rachael Cowan is a pleasant female who is seen in consultation from Dr. Mike Gip for ERT and gynecologic issues.   Rachael Cowan has a history of stage IIIB rectal cancer s/p concurrent chemotherapy and radiation followed by definitive surgery as per note below.   She originally presented with rectal bleeding.  Colonoscopy on 12/31/2011 revealed a significant stricture of the distal rectum. Malignant appearing ulcerated tumor was found. There was scattered area extending from about 10 cm the anal area involving anus itself.  Tumor was T4N1M0 (based on endoscopy [facility] ultrasound).  MRI scan of pelvis revealed invasion of the vaginal wall.  PET scan on 01/14/2012 revealed PET positive area within the rectum and distal sigmoid colon.   She was treated with 6 weeks of continuous infusion (Monday-Friday) 5-fluorouracil (5-FU) with radiation.  Radiation and chemotherapy completed on 03/07/2012.  She underwent rectosigmoidectomy, hysterectomy and oophorectomy on 05/13/2012 at [facility].  Dr. Maye Hides performed her surgery at [facility]. Pathologic stage was [id number]. MSI performed, limited sample, MSS.  She began post-operative FOLFOX chemotherapy in 05/2012.  Oxaliplatin was held in 06/2012 due to elevated liver enzymes and restarted in 07/2012.  She  finished FOLFOX chemotherapy in 12/2012 (11cycles). Because of neutropenia, her last treatment was discontinued.  Abdomen and pelvic CT on 10/16/2015 noted the resection for distal rectal adenocarcinoma. There was stable appearance of the lower anatomic pelvis with no new or progressive soft tissue to suggest local recurrence.  There was parastomal hernia containing an increased amount of pericolonic fat compared to the previous study.  Colonoscopy performed by Dr. Gaylyn Cheers on 12/14/2015 revealed one diminutive polyp in the sigmoid colon (removed).  Pathology revealed a tubular adenoma with no evidence of dysplasia or malignancy.  Nodules at the top of the colostomy revealed inflamed and ulcerated colocutaneous tissue.  She has no family history of colon cancer.  My Risk genetic testing was negative per patient report.  She has been followed by Dr. Mike Gip for her oncology issues. She was seen by Dr. Erasmo Leventhal for her postop visit in 2013 and given a prescription for oral estrogen for menopausal symptoms as well as vaginal estrogen and a dilator for vaginal stenosis. She has not been using the vaginal estrogen or dilator.   She uses premarin 0.09 mg daily dose and has good control of her hot flashes.     Problem List: Patient Active Problem List   Diagnosis Date Noted  . Well woman exam 06/17/2017  . S/P cholecystectomy 09/10/2016  . Incisional hernia, without obstruction or gangrene   . Rectal cancer ([place])   . History of hematuria 07/19/2016  . Hepatic steatosis 07/19/2016  . B12 deficiency 06/23/2013  . Obesity 06/23/2013  . Other and unspecified hyperlipidemia 06/23/2013  . Abdominal pain 06/01/2012  .  Febrile 06/01/2012  . Menopause 05/13/2012  . Airway hyperreactivity 05/07/2012  . Hypersomnia 05/07/2012  . History of rectal cancer 01/13/2012  . Primary hypersomnolence disorder 11/12/2011    Past Medical History: Past Medical History:  Diagnosis Date  . Allergy    . Asthma   . Gallstones   . Hypersomnolence disorder 2005   managed with metidate  . Rectal cancer ([place]) 05/13/2012   Resection with colostomy and hysterectomy.  . Sleep apnea 2005   resolved with ENT surgery,  [name]    Past Surgical History: Past Surgical History:  Procedure Laterality Date  . ABDOMINAL HYSTERECTOMY    . ARTHROSCOPIC REPAIR ACL  2006   right knee Dr Donna Christen at [facility]  . CHOLECYSTECTOMY N/A 07/21/2016   Procedure: LAPAROSCOPIC CHOLECYSTECTOMY CONVERTED TO OPEN;  Surgeon: Olean Ree, MD;  Location: [facility] ORS;  Service: General;  Laterality: N/A;  . COLON SURGERY    . COLONOSCOPY WITH PROPOFOL N/A 12/14/2015   Procedure: COLONOSCOPY WITH PROPOFOL;  Surgeon: Manya Silvas, MD;  Location: [facility] END[facility]COPY;  Service: Endoscopy;  Laterality: N/A;  . Colostomy Placement    . PORTA CATH REMOVAL N/A 11/06/2016   Procedure: Glori Luis Cath Removal;  Surgeon: Algernon Huxley, MD;  Location: [place] CV LAB;  Service: Cardiovascular;  Laterality: N/A;  . TONSILLECTOMY AND ADENOIDECTOMY      Past Gynecologic History: see HPI Menarche age 36 y.o.    OB History: G0P0 OB History  No data available    Family History: Family History  Problem Relation Age of Onset  . Heart attack Maternal Grandfather   . Hyperlipidemia Paternal Grandfather   . Hypertension Paternal Grandfather   . Skin cancer Maternal Grandmother     Social History: Social History   Social History  . Marital status: Single    Spouse name: N/A  . Number of children: N/A  . Years of education: N/A   Occupational History  . Not on file.   Social History Main Topics  . Smoking status: Never Smoker  . Smokeless tobacco: Never Used  . Alcohol use Yes     Comment: ocassional  . Drug use: No  . Sexual activity: No   Other Topics Concern  . Not on file   Social History Narrative  . No narrative on file    Allergies: No Known Allergies  Current Medications: Current Outpatient  Prescriptions  Medication Sig Dispense Refill  . cyanocobalamin 500 MCG tablet Take 500 mcg by mouth daily.     Marland Kitchen estrogens, conjugated, (PREMARIN) 0.9 MG tablet Take 1 tablet (0.9 mg total) by mouth daily. 30 tablet 11  . Insulin Pen Needle (PEN NEEDLES) 31G X 6 MM MISC 1 application by Does not apply route daily. 100 each 3  . methylphenidate (METADATE CD) 30 MG CR capsule Take 1 capsule (30 mg total) by mouth 2 (two) times daily. In the morning and in the afternoon 60 capsule 0  . montelukast (SINGULAIR) 10 MG tablet Take 10 mg by mouth daily.     . Multiple Vitamins-Calcium (ONE-A-DAY WOMENS PO) Take 1 tablet by mouth daily.     Marland Kitchen omega-3 acid ethyl esters (LOVAZA) 1 g capsule Take 1 g by mouth 2 (two) times daily.    Marland Kitchen SAXENDA 18 MG/3ML SOPN INJECT 0.6MG SUBCUTANEOUSLY EVERY DAY WEEK 1, 1.2MG WEEK 2, 1.8MG WEEK 3, 2.4MG WEEK 4 15 pen 0  . loratadine (CLARITIN) 10 MG tablet Take 10 mg by mouth daily.    Marland Kitchen  SAXENDA 18 MG/3ML SOPN INJECT 2.4 MG UNDER THE SKIN EVERY DAY (Patient not taking: Reported on 06/17/2017) 15 pen 0  . SAXENDA 18 MG/3ML SOPN INJECT 2.4 MG UNDER THE SKIN EVERY DAY (Patient not taking: Reported on 06/17/2017) 15 pen 0   No current facility-administered medications for this visit.     Review of Systems General: weight gain  HEENT: no complaints  Lungs: no complaints  Cardiac: no complaints  GI: no complaints  GU: urinary incontinence. Not sexually active  Musculoskeletal: no complaints  Extremities: no complaints  Skin: no complaints  Neuro: no complaints  Endocrine: no complaints  Psych: no complaints       Objective:  Physical Examination:  BP 113/78   Pulse 96   Temp 98.6 F (37 C) (Tympanic)   Resp 18   Ht _0  (1.575 m)   Wt 205 lb 6.4 oz (93.2 kg)   LMP 10/25/2011   BMI 37.57 kg/m    ECOG Performance Status: 0 - Asymptomatic  General appearance: alert, cooperative and appears stated age HEENT:extra ocular movement intact and sclera clear,  anicteric Abdomen: soft, non-tender, without masses or organomegaly, no hernias and well healed incision, ostomy intact. Extremities: extremities normal, atraumatic, no cyanosis or edema Neurological exam reveals alert, oriented, normal speech, no focal findings or movement disorder noted.  Pelvic: exam chaperoned by nurse;  Vulva: normal appearing vulva with no masses, tenderness or lesions; Vagina: deviated posteriorly, length and width preserved. KCystocele present.  BME.  Adnexa: no masses surgically absent bilateral; Uterus: surgically absent, vaginal cuff well healed; Cervix: absent; Rectal:surgically absent      Assessment:  CHARLISA CHAM is a 31 y.o. female diagnosed with h/o stage IIIB rectal cancer s/p concurrent chemotherapy and radiation followed by definitive surgery XL rectosigmoidectomy with APR, hysterectomy and oophorectomy on 05/13/2012. Clinically NED. Menopause managed with ERT. Abnormal anatomy. Urinary incontinence with cystocele.   Medical co-morbidities complicating care: chemotherapy and radiation. Body mass index is 37.57 kg/m.  Plan:   Problem List Items Addressed This Visit      Other   Well woman exam - Primary      Continue ERT and she will contact me if she has issues with refills. Follow up in one year.  A total of 40 minutes were spent with the patient/family today; of the exam as well as education, counseling and coordination of care.   Gillis Ends, MD    CC: Dr. Lequita Asal

## 2017-06-17 NOTE — Progress Notes (Signed)
No gyn concerns 

## 2017-06-22 DIAGNOSIS — Z933 Colostomy status: Secondary | ICD-10-CM | POA: Diagnosis not present

## 2017-06-25 ENCOUNTER — Other Ambulatory Visit: Payer: Self-pay | Admitting: Internal Medicine

## 2017-06-25 NOTE — Telephone Encounter (Signed)
Please advise for refill as last OV was 12/10/2016, Last refill was that same day for #60 capsule no refills.  Please advise

## 2017-06-26 MED ORDER — METHYLPHENIDATE HCL ER (CD) 30 MG PO CPCR: 30.0000 mg | ORAL_CAPSULE | Freq: Two times a day (BID) | ORAL | 0 refills | Status: DC

## 2017-06-26 NOTE — Telephone Encounter (Signed)
Refill for 30 days only.  OFFICE VISIT NEEDED prior to any more refills 

## 2017-06-26 NOTE — Telephone Encounter (Signed)
rx printed, signed and faxed

## 2017-06-26 NOTE — Telephone Encounter (Signed)
Accidentally faxed. Pharmacy has been made aware of accidental fax. Pt has been called she stated that she would be by to pick up the rx on Monday. Pt also has an appt scheduled for 07/29/2017 with Dr. Derrel Nip.

## 2017-07-29 ENCOUNTER — Encounter: Payer: Self-pay | Admitting: Internal Medicine

## 2017-07-29 ENCOUNTER — Ambulatory Visit (INDEPENDENT_AMBULATORY_CARE_PROVIDER_SITE_OTHER): Payer: BLUE CROSS/BLUE SHIELD | Admitting: Internal Medicine

## 2017-07-29 VITALS — BP 106/68 | HR 70 | Temp 97.7°F | Resp 15 | Ht 62.0 in | Wt 205.4 lb

## 2017-07-29 DIAGNOSIS — E7800 Pure hypercholesterolemia, unspecified: Secondary | ICD-10-CM

## 2017-07-29 DIAGNOSIS — E538 Deficiency of other specified B group vitamins: Secondary | ICD-10-CM | POA: Diagnosis not present

## 2017-07-29 DIAGNOSIS — Z6835 Body mass index (BMI) 35.0-35.9, adult: Secondary | ICD-10-CM | POA: Diagnosis not present

## 2017-07-29 DIAGNOSIS — K760 Fatty (change of) liver, not elsewhere classified: Secondary | ICD-10-CM | POA: Diagnosis not present

## 2017-07-29 DIAGNOSIS — R5383 Other fatigue: Secondary | ICD-10-CM

## 2017-07-29 DIAGNOSIS — Z23 Encounter for immunization: Secondary | ICD-10-CM

## 2017-07-29 DIAGNOSIS — Z85048 Personal history of other malignant neoplasm of rectum, rectosigmoid junction, and anus: Secondary | ICD-10-CM

## 2017-07-29 DIAGNOSIS — G4710 Hypersomnia, unspecified: Secondary | ICD-10-CM

## 2017-07-29 LAB — LIPID PANEL
Cholesterol: 193 mg/dL (ref 0–200)
HDL: 58.5 mg/dL (ref >39.00)
NonHDL: 134.58
Total CHOL/HDL Ratio: 3
Triglycerides: 389 mg/dL — ABNORMAL HIGH (ref 0.0–149.0)
VLDL: 77.8 mg/dL — ABNORMAL HIGH (ref 0.0–40.0)

## 2017-07-29 LAB — LDL CHOLESTEROL, DIRECT: Direct LDL: 70 mg/dL

## 2017-07-29 LAB — COMPREHENSIVE METABOLIC PANEL
ALT: 25 U/L (ref 0–35)
AST: 17 U/L (ref 0–37)
Albumin: 4.3 g/dL (ref 3.5–5.2)
Alkaline Phosphatase: 65 U/L (ref 39–117)
BUN: 13 mg/dL (ref 6–23)
CO2: 25 mEq/L (ref 19–32)
Calcium: 9.8 mg/dL (ref 8.4–10.5)
Chloride: 103 mEq/L (ref 96–112)
Creatinine, Ser: 0.77 mg/dL (ref 0.40–1.20)
GFR: 93.74 mL/min (ref >60.00)
Glucose, Bld: 88 mg/dL (ref 70–99)
Potassium: 4.3 mEq/L (ref 3.5–5.1)
Sodium: 137 mEq/L (ref 135–145)
Total Bilirubin: 0.4 mg/dL (ref 0.2–1.2)
Total Protein: 7.2 g/dL (ref 6.0–8.3)

## 2017-07-29 LAB — VITAMIN B12: Vitamin B-12: 1131 pg/mL — ABNORMAL HIGH (ref 211–911)

## 2017-07-29 LAB — TSH: TSH: 3.23 u[IU]/mL (ref 0.35–4.50)

## 2017-07-29 MED ORDER — METHYLPHENIDATE HCL ER (CD) 30 MG PO CPCR: 30.0000 mg | ORAL_CAPSULE | Freq: Two times a day (BID) | ORAL | 0 refills | Status: DC

## 2017-07-29 MED ORDER — ALBUTEROL SULFATE HFA 108 (90 BASE) MCG/ACT IN AERS
2.0000 | INHALATION_SPRAY | Freq: Four times a day (QID) | RESPIRATORY_TRACT | 2 refills | Status: DC | PRN
Start: 2017-07-29 — End: 2020-02-01

## 2017-07-29 MED ORDER — NALTREXONE-BUPROPION HCL ER 8-90 MG PO TB12: ORAL_TABLET | ORAL | 0 refills | Status: DC

## 2017-07-29 NOTE — Progress Notes (Signed)
Patient ID: Rachael Cowan, female    DOB: 1986-01-12  Age: 31 y.o. MRN: [id number]  The patient is here for management of chronic and acute problems.   Well woman exam and PAP SMEAR DONE BY ONCOLOGY sept 2018 due to histor of invasice rectal cancer in 2-13 invading the vaginal wall .  S/p TAH/BSO at [facility] Repeat colon 2017 elliott one polyp tubular adenoma.    Taking premarin for menopausal symptoms, prescribed vagginal estrogen with dilator (not using)   obesity got down to 180 in April  After gallbladder  surgery  Cutting carbs.  Did notTOLERATE  Saxenda , already taking methylphenidate bid for narcolepsy   The risk factors are reflected in the social history.  The roster of all physicians providing medical care to patient - is listed in the Snapshot section of the chart.  Activities of daily living:  The patient is 100% independent in all ADLs: dressing, toileting, feeding as well as independent mobility  Home safety : The patient has smoke detectors in the home. They wear seatbelts.  There are no firearms at home. There is no violence in the home.   There is no risks for hepatitis, STDs or HIV. There is no   history of blood transfusion. They have no travel history to infectious disease endemic areas of the world.  The patient has seen their dentist in the last six month. They have seen their eye doctor in the last year. T   They do not  have excessive sun exposure. Discussed the need for sun protection: hats, long sleeves and use of sunscreen if there is significant sun exposure.   Diet: the importance of a healthy diet is discussed. They do have a healthy diet.  The benefits of regular aerobic exercise were discussed. She walks 4 times per week ,  30 minutes . Depression screen: there are no signs or vegative symptoms of depression- irritability, change in appetite, anhedonia, sadness/tearfullness.  Cognitive assessment: the patient manages all their financial and personal affairs  and is actively engaged. They could relate day,date,year and events; recalled 2/3 objects at 3 minutes; performed clock-face test normally.  The following portions of the patient's history were reviewed and updated as appropriate: allergies, current medications, past family history, past medical history,  past surgical history, past social history  and problem list.  Visual acuity was not assessed per patient preference since she has regular follow up with her ophthalmologist. Hearing and body mass index were assessed and reviewed.   During the course of the visit the patient was educated and counseled about appropriate screening and preventive services including : fall prevention , diabetes screening, nutrition counseling, colorectal cancer screening, and recommended immunizations.    CC: The primary encounter diagnosis was B12 deficiency. Diagnoses of Need for immunization against influenza, Fatigue, unspecified type, Pure hypercholesterolemia, Hypersomnia, Hepatic steatosis, History of rectal cancer, and Class 2 severe obesity due to excess calories with serious comorbidity and body mass index (BMI) of 35.0 to 35.9 in adult [facility]) were also pertinent to this visit.  History Rachael Cowan has a past medical history of Allergy; Asthma; Gallstones; Hypersomnolence disorder (2005); Rectal cancer ([place]) (05/13/2012); and Sleep apnea (2005).   She has a past surgical history that includes Arthroscopic repair ACL (2006); Abdominal hysterectomy; Colon surgery; Colonoscopy with propofol (N/A, 12/14/2015); Colostomy Placement; Tonsillectomy and adenoidectomy; PORTA CATH REMOVAL (N/A, 11/06/2016); and Cholecystectomy (N/A, 07/21/2016).   Her family history includes Heart attack in her maternal grandfather; Hyperlipidemia in her paternal  grandfather; Hypertension in her paternal grandfather; Skin cancer in her maternal grandmother.She reports that she has never smoked. She has never used smokeless tobacco. She reports that  she drinks alcohol. She reports that she does not use drugs.  Outpatient Medications Prior to Visit  Medication Sig Dispense Refill  . cyanocobalamin 500 MCG tablet Take 500 mcg by mouth daily.     Marland Kitchen estrogens, conjugated, (PREMARIN) 0.9 MG tablet Take 1 tablet (0.9 mg total) by mouth daily. 30 tablet 11  . Insulin Pen Needle (PEN NEEDLES) 31G X 6 MM MISC 1 application by Does not apply route daily. 100 each 3  . loratadine (CLARITIN) 10 MG tablet Take 10 mg by mouth daily.    . montelukast (SINGULAIR) 10 MG tablet Take 10 mg by mouth daily.     . Multiple Vitamins-Calcium (ONE-A-DAY WOMENS PO) Take 1 tablet by mouth daily.     Marland Kitchen omega-3 acid ethyl esters (LOVAZA) 1 g capsule Take 1 g by mouth 2 (two) times daily.    . methylphenidate (METADATE CD) 30 MG CR capsule Take 1 capsule (30 mg total) by mouth 2 (two) times daily. In the morning and in the afternoon 60 capsule 0  . SAXENDA 18 MG/3ML SOPN INJECT 0.6MG  SUBCUTANEOUSLY EVERY DAY WEEK 1, 1.2MG  WEEK 2, 1.8MG  WEEK 3, 2.4MG  WEEK 4 (Patient not taking: Reported on 07/29/2017) 15 pen 0  . SAXENDA 18 MG/3ML SOPN INJECT 2.4 MG UNDER THE SKIN EVERY DAY (Patient not taking: Reported on 06/17/2017) 15 pen 0  . SAXENDA 18 MG/3ML SOPN INJECT 2.4 MG UNDER THE SKIN EVERY DAY (Patient not taking: Reported on 06/17/2017) 15 pen 0   No facility-administered medications prior to visit.     Review of Systems   Patient denies headache, fevers, malaise, unintentional weight loss, skin rash, eye pain, sinus congestion and sinus pain, sore throat, dysphagia,  hemoptysis , cough, dyspnea, wheezing, chest pain, palpitations, orthopnea, edema, abdominal pain, nausea, melena, diarrhea, constipation, flank pain, dysuria, hematuria, urinary  Frequency, nocturia, numbness, tingling, seizures,  Focal weakness, Loss of consciousness,  Tremor, insomnia, depression, anxiety, and suicidal ideation.      Objective:  BP 106/68 (BP Location: Left Arm, Patient Position:  Sitting, Cuff Size: Large)   Pulse 70   Temp 97.7 F (36.5 C) (Oral)   Resp 15   Ht 5\' 2"  (1.575 m)   Wt 205 lb 6.4 oz (93.2 kg)   LMP 10/25/2011   SpO2 98%   BMI 37.57 kg/m   Physical Exam   General appearance: alert, cooperative and appears stated age Head: Normocephalic, without obvious abnormality, atraumatic Eyes: conjunctivae/corneas clear. PERRL, EOM's intact. Fundi benign. Ears: normal TM's and external ear canals both ears Nose: Nares normal. Septum midline. Mucosa normal. No drainage or sinus tenderness. Throat: lips, mucosa, and tongue normal; teeth and gums normal Neck: no adenopathy, no carotid bruit, no JVD, supple, symmetrical, trachea midline and thyroid not enlarged, symmetric, no tenderness/mass/nodules Lungs: clear to auscultation bilaterally Breasts: normal appearance, no masses or tenderness Heart: regular rate and rhythm, S1, S2 normal, no murmur, click, rub or gallop Abdomen: soft, non-tender; bowel sounds normal; no masses,  no organomegaly Extremities: extremities normal, atraumatic, no cyanosis or edema Pulses: 2+ and symmetric Skin: Skin color, texture, turgor normal. No rashes or lesions Neurologic: Alert and oriented X 3, normal strength and tone. Normal symmetric reflexes. Normal coordination and gait.     Assessment & Plan:   Problem List Items Addressed This Visit  B12 deficiency - Primary    Secondary to colectomy.  Continue IM injections  Lab Results  Component Value Date   VITAMINB12 1,131 (H) 07/29/2017         Relevant Orders   Vitamin B12 (Completed)   Hepatic steatosis    Presumed by ultrasound changes and negative serologies to rule out autoimmune causes of hepatitis.  Current liver enzymes are normal and all modifiable risk factors including obesity, diabetes and hyperlipidemia have been addressed   Lab Results  Component Value Date   ALT 25 07/29/2017   AST 17 07/29/2017   ALKPHOS 65 07/29/2017   BILITOT 0.4 07/29/2017          History of rectal cancer    She has has no recurrence since treatment ended in 2014.       Hypersomnia    Secondary to narcolepsy. Previously managed by Dr. Tamala Julian at the [place] with Metadate.   Refills given       Obesity    I have addressed  BMI and recommended a low glycemic index diet utilizing smaller more frequent meals to increase metabolism.  I have also recommended that patient start exercising with a goal of 30 minutes of aerobic exercise a minimum of 5 days per week. She tried [place] but was unable to take it without nausea. Trial of  Contrave recommended.        Relevant Medications   methylphenidate (METADATE CD) 30 MG CR capsule   Naltrexone-Bupropion HCl ER 8-90 MG TB12    Other Visit Diagnoses    Need for immunization against influenza       Relevant Orders   Flu Vaccine QUAD 36+ mos IM (Completed)   Fatigue, unspecified type       Relevant Orders   Comprehensive metabolic panel (Completed)   TSH (Completed)   Pure hypercholesterolemia       Relevant Orders   Lipid panel (Completed)      I have discontinued Ms. Soldo SAXENDA, SAXENDA, SAXENDA, and Naltrexone-Bupropion HCl ER. I am also having her start on albuterol and Naltrexone-Bupropion HCl ER. Additionally, I am having her maintain her loratadine, estrogens (conjugated), cyanocobalamin, montelukast, omega-3 acid ethyl esters, Multiple Vitamins-Calcium (ONE-A-DAY WOMENS PO), Pen Needles, and methylphenidate.  Meds ordered this encounter  Medications  . albuterol (PROVENTIL HFA;VENTOLIN HFA) 108 (90 Base) MCG/ACT inhaler    Sig: Inhale 2 puffs into the lungs every 6 (six) hours as needed for wheezing or shortness of breath.    Dispense:  1 Inhaler    Refill:  2  . DISCONTD: Naltrexone-Bupropion HCl ER (CONTRAVE) 8-90 MG TB12    Sig: Take as directed increase dose gradually    Dispense:  60 tablet    Refill:  0  . DISCONTD: methylphenidate (METADATE CD) 30 MG  CR capsule    Sig: Take 1 capsule (30 mg total) by mouth 2 (two) times daily. In the morning and in the afternoon    Dispense:  60 capsule    Refill:  0  . DISCONTD: methylphenidate (METADATE CD) 30 MG CR capsule    Sig: Take 1 capsule (30 mg total) by mouth 2 (two) times daily. In the morning and in the afternoon    Dispense:  60 capsule    Refill:  0    MAY REFILL ON OR AFTER Aug 28 2017  . methylphenidate (METADATE CD) 30 MG CR capsule    Sig: Take 1 capsule (30 mg total) by mouth  2 (two) times daily. In the morning and in the afternoon    Dispense:  60 capsule    Refill:  0    MAY REFILL ON OR AFTER Sep 27 2017  . Naltrexone-Bupropion HCl ER 8-90 MG TB12    Sig: One tablet every morning for one week, then twice daily for one week.. Increase gradually to 2 tablets twice daily    Dispense:  120 tablet    Refill:  0    Medications Discontinued During This Encounter  Medication Reason  . SAXENDA 18 MG/3ML SOPN Patient has not taken in last 30 days  . SAXENDA 18 MG/3ML SOPN Patient has not taken in last 30 days  . SAXENDA 18 MG/3ML SOPN Patient has not taken in last 30 days  . methylphenidate (METADATE CD) 30 MG CR capsule Reorder  . methylphenidate (METADATE CD) 30 MG CR capsule Reorder  . methylphenidate (METADATE CD) 30 MG CR capsule Reorder  . Naltrexone-Bupropion HCl ER (CONTRAVE) 8-90 MG TB12     Follow-up: No Follow-up on file.   [name], MD

## 2017-07-29 NOTE — Patient Instructions (Signed)
We will try to get Contrave authorized through your insurance   Bupropion; Naltrexone extended-release tablets What is this medicine? BUPROPION; NALTREXONE (byoo PROE pee on; nal TREX one) is a combination product used to promote and maintain weight loss in obese adults or overweight adults who also have weight related medical problems. This medicine should be used with a reduced calorie diet and increased physical activity. This medicine may be used for other purposes; ask your health care provider or pharmacist if you have questions. COMMON BRAND NAME(S): CONTRAVE What should I tell my health care provider before I take this medicine? They need to know if you have any of these conditions: -an eating disorder, such as anorexia or bulimia -bipolar disorder -diabetes -depression -drug abuse or addiction -glaucoma -head injury -heart disease -high blood pressure -history of a tumor or infection of your brain or spine -history of stroke -history of irregular heartbeat -if you often drink alcohol -kidney disease -liver disease -schizophrenia -seizures -suicidal thoughts, plans, or attempt; a previous suicide attempt by you or a family member -an unusual or allergic reaction to bupropion, naltrexone, other medicines, foods, dyes, or preservatives -breast-feeding -pregnant or trying to become pregnant How should I use this medicine? Take this medicine by mouth with a glass of water. Follow the directions on the prescription label. Take this medicine in the morning and in the evenings as directed by your healthcare professional. Dennis Bast can take it with or without food. Do not take with high-fat meals as this may increase your risk of seizures. Do not crush, chew, or cut these tablets. Do not take your medicine more often than directed. Do not stop taking this medicine suddenly except upon the advice of your doctor. A special MedGuide will be given to you by the pharmacist with each prescription  and refill. Be sure to read this information carefully each time. Talk to your pediatrician regarding the use of this medicine in children. Special care may be needed. Overdosage: If you think you have taken too much of this medicine contact a poison control center or emergency room at once. NOTE: This medicine is only for you. Do not share this medicine with others. What if I miss a dose? If you miss a dose, skip the missed dose and take your next tablet at the regular time. Do not take double or extra doses. What may interact with this medicine? Do not take this medicine with any of the following medications: -any prescription or street opioid drug like codeine, heroin, methadone -linezolid -MAOIs like Carbex, Eldepryl, Marplan, Nardil, and Parnate -methylene blue (injected into a vein) -other medicines that contain bupropion like Zyban or Wellbutrin This medicine may also interact with the following medications: -alcohol -certain medicines for anxiety or sleep -certain medicines for blood pressure like metoprolol, propranolol -certain medicines for depression or psychotic disturbances -certain medicines for HIV or AIDS like efavirenz, lopinavir, nelfinavir, ritonavir -certain medicines for irregular heart beat like propafenone, flecainide -certain medicines for Parkinson's disease like amantadine, levodopa -certain medicines for seizures like carbamazepine, phenytoin, phenobarbital -cimetidine -clopidogrel -cyclophosphamide -digoxin -disulfiram -furazolidone -isoniazid -nicotine -orphenadrine -procarbazine -steroid medicines like prednisone or cortisone -stimulant medicines for attention disorders, weight loss, or to stay awake -tamoxifen -theophylline -thioridazine -thiotepa -ticlopidine -tramadol -warfarin This list may not describe all possible interactions. Give your health care provider a list of all the medicines, herbs, non-prescription drugs, or dietary supplements  you use. Also tell them if you smoke, drink alcohol, or use illegal drugs. Some items may  interact with your medicine. What should I watch for while using this medicine? This medicine is intended to be used in addition to a healthy diet and appropriate exercise. The best results are achieved this way. Do not increase or in any way change your dose without consulting your doctor or health care professional. Do not take this medicine with other prescription or over-the-counter weight loss products without consulting your doctor or health care professional. Your doctor should tell you to stop taking this medicine if you do not lose a certain amount of weight within the first 12 weeks of treatment. Visit your doctor or health care professional for regular checkups. Your doctor may order blood tests or other tests to see how you are doing. This medicine may affect blood sugar levels. If you have diabetes, check with your doctor or health care professional before you change your diet or the dose of your diabetic medicine. Patients and their families should watch out for new or worsening depression or thoughts of suicide. Also watch out for sudden changes in feelings such as feeling anxious, agitated, panicky, irritable, hostile, aggressive, impulsive, severely restless, overly excited and hyperactive, or not being able to sleep. If this happens, especially at the beginning of treatment or after a change in dose, call your health care professional. Avoid alcoholic drinks while taking this medicine. Drinking large amounts of alcoholic beverages, using sleeping or anxiety medicines, or quickly stopping the use of these agents while taking this medicine may increase your risk for a seizure. What side effects may I notice from receiving this medicine? Side effects that you should report to your doctor or health care professional as soon as possible: -allergic reactions like skin rash, itching or hives, swelling of the  face, lips, or tongue -breathing problems -changes in vision -confusion -elevated mood, decreased need for sleep, racing thoughts, impulsive behavior -fast or irregular heartbeat -hallucinations, loss of contact with reality -increased blood pressure -redness, blistering, peeling or loosening of the skin, including inside the mouth -seizures -signs and symptoms of liver injury like dark yellow or brown urine; general ill feeling or flu-like symptoms; light-colored stools; loss of appetite; nausea; right upper belly pain; unusually weak or tired; yellowing of the eyes or skin -suicidal thoughts or other mood changes -vomiting Side effects that usually do not require medical attention (report to your doctor or health care professional if they continue or are bothersome): -constipation -headache -loss of appetite -indigestion, stomach upset -tremors This list may not describe all possible side effects. Call your doctor for medical advice about side effects. You may report side effects to FDA at 1-800-FDA-1088. Where should I keep my medicine? Keep out of the reach of children. Store at room temperature between 15 and 30 degrees C (59 and 86 degrees F). Throw away any unused medicine after the expiration date. NOTE: This sheet is a summary. It may not cover all possible information. If you have questions about this medicine, talk to your doctor, pharmacist, or health care provider.  2018 Elsevier/Gold Standard (2016-03-07 13:42:58)

## 2017-07-30 ENCOUNTER — Encounter: Payer: Self-pay | Admitting: Internal Medicine

## 2017-07-30 NOTE — Assessment & Plan Note (Signed)
She has has no recurrence since treatment ended in 2014.

## 2017-07-30 NOTE — Assessment & Plan Note (Signed)
Secondary to colectomy.  Continue IM injections  Lab Results  Component Value Date   VITAMINB12 1,131 (H) 07/29/2017

## 2017-07-30 NOTE — Assessment & Plan Note (Addendum)
Secondary to narcolepsy. Previously managed by Dr. Smith at the [facility] with Metadate.   Refills given  

## 2017-07-30 NOTE — Assessment & Plan Note (Signed)
Presumed by ultrasound changes and negative serologies to rule out autoimmune causes of hepatitis.  Current liver enzymes are normal and all modifiable risk factors including obesity, diabetes and hyperlipidemia have been addressed   Lab Results  Component Value Date   ALT 25 07/29/2017   AST 17 07/29/2017   ALKPHOS 65 07/29/2017   BILITOT 0.4 07/29/2017

## 2017-07-30 NOTE — Assessment & Plan Note (Addendum)
I have addressed  BMI and recommended a low glycemic index diet utilizing smaller more frequent meals to increase metabolism.  I have also recommended that patient start exercising with a goal of 30 minutes of aerobic exercise a minimum of 5 days per week. She tried [place] but was unable to take it without nausea. Trial of  Contrave recommended.

## 2017-08-03 DIAGNOSIS — Z933 Colostomy status: Secondary | ICD-10-CM | POA: Diagnosis not present

## 2017-08-04 ENCOUNTER — Telehealth: Payer: Self-pay

## 2017-08-04 NOTE — Telephone Encounter (Signed)
PA for contrave has been submitted on covermymeds.

## 2017-08-04 NOTE — Progress Notes (Signed)
PA for Contrave has been submitted on covermymeds.  

## 2017-08-07 ENCOUNTER — Telehealth: Payer: Self-pay | Admitting: Internal Medicine

## 2017-08-07 NOTE — Telephone Encounter (Signed)
Copied from [place] [id number]. Topic: Quick Communication - See Telephone Encounter >> Aug 07, 2017  4:59 PM Boyd Kerbs wrote: CRM for notification. See Telephone encounter for:  Minette Headland has been approved by [facility] =- Nicki [id number]  08/07/17.

## 2017-09-06 ENCOUNTER — Other Ambulatory Visit: Payer: Self-pay | Admitting: Internal Medicine

## 2017-09-06 DIAGNOSIS — Z6835 Body mass index (BMI) 35.0-35.9, adult: Principal | ICD-10-CM

## 2017-09-09 ENCOUNTER — Telehealth: Payer: Self-pay | Admitting: Internal Medicine

## 2017-09-09 NOTE — Telephone Encounter (Signed)
Copied from [place]. Topic: General - Other >> Sep 09, 2017  3:47 PM Rachael Cowan, NT wrote: Reason for CRM: [facility] called and needs directions for contrave ,was e scribed without instructions please resend or call [phone]

## 2017-09-10 MED ORDER — NALTREXONE-BUPROPION HCL ER 8-90 MG PO TB12: ORAL_TABLET | ORAL | 0 refills | Status: DC

## 2017-09-10 NOTE — Telephone Encounter (Signed)
One tablet in AM daily for one week,  One tablet twice daily for week 2; 2 tablets in AM and 1 tablet in PM for week 3;  2 tablets twice daily Week 4    rx repreinted with directions,  plesae fax

## 2017-09-10 NOTE — Telephone Encounter (Signed)
rx has been re-faxed.  

## 2017-09-10 NOTE — Telephone Encounter (Signed)
What are the directions of use for contrave?

## 2017-09-30 ENCOUNTER — Other Ambulatory Visit: Payer: Self-pay | Admitting: Internal Medicine

## 2017-09-30 DIAGNOSIS — Z6835 Body mass index (BMI) 35.0-35.9, adult: Principal | ICD-10-CM

## 2017-09-30 NOTE — Telephone Encounter (Signed)
Refilled: 09/09/2017 Last OV: 07/29/2017 Next OV: 07/30/2018

## 2017-10-01 NOTE — Telephone Encounter (Signed)
Contrave refilled but patient needs to be seen in February please schedule an appt

## 2017-10-06 NOTE — Telephone Encounter (Signed)
Pt has been scheduled for 11/18/2017 at 1:30pm.

## 2017-10-13 ENCOUNTER — Other Ambulatory Visit: Payer: Self-pay

## 2017-10-13 DIAGNOSIS — J309 Allergic rhinitis, unspecified: Secondary | ICD-10-CM

## 2017-10-13 MED ORDER — MONTELUKAST SODIUM 10 MG PO TABS: 10.0000 mg | ORAL_TABLET | Freq: Every day | ORAL | 3 refills | Status: DC

## 2017-10-21 DIAGNOSIS — Z933 Colostomy status: Secondary | ICD-10-CM | POA: Diagnosis not present

## 2017-10-23 ENCOUNTER — Inpatient Hospital Stay (HOSPITAL_BASED_OUTPATIENT_CLINIC_OR_DEPARTMENT_OTHER): Payer: BLUE CROSS/BLUE SHIELD | Admitting: Hematology and Oncology

## 2017-10-23 ENCOUNTER — Inpatient Hospital Stay: Payer: BLUE CROSS/BLUE SHIELD | Attending: Hematology and Oncology

## 2017-10-23 ENCOUNTER — Other Ambulatory Visit: Payer: Self-pay | Admitting: Hematology and Oncology

## 2017-10-23 VITALS — BP 127/81 | HR 69 | Temp 97.7°F | Resp 18 | Wt 201.1 lb

## 2017-10-23 DIAGNOSIS — C20 Malignant neoplasm of rectum: Secondary | ICD-10-CM

## 2017-10-23 DIAGNOSIS — K435 Parastomal hernia without obstruction or  gangrene: Secondary | ICD-10-CM | POA: Diagnosis not present

## 2017-10-23 DIAGNOSIS — Z933 Colostomy status: Secondary | ICD-10-CM | POA: Insufficient documentation

## 2017-10-23 DIAGNOSIS — R32 Unspecified urinary incontinence: Secondary | ICD-10-CM

## 2017-10-23 DIAGNOSIS — Z9221 Personal history of antineoplastic chemotherapy: Secondary | ICD-10-CM | POA: Insufficient documentation

## 2017-10-23 LAB — CBC WITH DIFFERENTIAL/PLATELET
Basophils Absolute: 0 10*3/uL (ref 0–0.1)
Basophils Relative: 1 %
Eosinophils Absolute: 0.1 10*3/uL (ref 0–0.7)
Eosinophils Relative: 2 %
HCT: 38.6 % (ref 35.0–47.0)
Hemoglobin: 13.3 g/dL (ref 12.0–16.0)
Lymphocytes Relative: 40 %
Lymphs Abs: 1.9 10*3/uL (ref 1.0–3.6)
MCH: 30.8 pg (ref 26.0–34.0)
MCHC: 34.4 g/dL (ref 32.0–36.0)
MCV: 89.4 fL (ref 80.0–100.0)
Monocytes Absolute: 0.3 10*3/uL (ref 0.2–0.9)
Monocytes Relative: 6 %
Neutro Abs: 2.4 10*3/uL (ref 1.4–6.5)
Neutrophils Relative %: 51 %
Platelets: 191 10*3/uL (ref 150–440)
RBC: 4.32 MIL/uL (ref 3.80–5.20)
RDW: 13.5 % (ref 11.5–14.5)
WBC: 4.7 10*3/uL (ref 3.6–11.0)

## 2017-10-23 LAB — COMPREHENSIVE METABOLIC PANEL
ALT: 34 U/L (ref 14–54)
AST: 30 U/L (ref 15–41)
Albumin: 4 g/dL (ref 3.5–5.0)
Alkaline Phosphatase: 75 U/L (ref 38–126)
Anion gap: 10 (ref 5–15)
BUN: 14 mg/dL (ref 6–20)
CO2: 24 mmol/L (ref 22–32)
Calcium: 9.2 mg/dL (ref 8.9–10.3)
Chloride: 104 mmol/L (ref 101–111)
Creatinine, Ser: 0.9 mg/dL (ref 0.44–1.00)
GFR calc Af Amer: >60 mL/min (ref >60)
GFR calc non Af Amer: >60 mL/min (ref >60)
Glucose, Bld: 106 mg/dL — ABNORMAL HIGH (ref 65–99)
Potassium: 3.9 mmol/L (ref 3.5–5.1)
Sodium: 138 mmol/L (ref 135–145)
Total Bilirubin: 0.3 mg/dL (ref 0.3–1.2)
Total Protein: 7.2 g/dL (ref 6.5–8.1)

## 2017-10-23 NOTE — Progress Notes (Signed)
[facility]cer Center  Clinic day:  10/23/17  Chief Complaint: Rachael Cowan is a 32 y.o. female with a history of stage IIIB rectal cancer who is seen for 1 year assessment.  HPI:  The patient was last seen in the medical oncology clinic on  10/23/2016.  At that time, she was doing well.  Exam was unremarkable.  CBC with diff and CMP were normal.  CEA was 1.1.  Abdomen and pelvic CT on 10/15/2016 revealed no acute findings in the abdomen or pelvis.  She saw Dr. Secord on 09/192018.  Clinically there was no evidence of disease. Menopause was managed with ERT.  There was abnormal anatomy. There was urinary incontinence with cystocele.  It was recommended that she continue ERT with weaning at age 32.  Vaginal dilators had been recommended to prevent stenosis, but were not being used.   She has a follow-up in 1 year.  She underwent port-a-cath removal on 11/06/2016.  Symptomatically, patient is doing "really good". Patient has no acute physical concerns. Patient denies bleeding; no hematochezia, melena, or abnormal vaginal bleeding. Patient has has intermittent pain when she passes stool through her colostomy. She describes the pain as "crampy and stabbing".  Stool is not hard, yet it is "solid and normal".  Patient scheduled to follow up with Dr. Elliott in February. Interval colonoscopy is due in March of this year.   Patient denies any B symptoms or significant interval infections. Patient is eating well. She has lost 4 pounds.    Past Medical History:  Diagnosis Date  . Allergy   . Asthma   . Gallstones   . Hypersomnolence disorder 2005   managed with metidate  . Rectal cancer (HCC) 05/13/2012   Resection with colostomy and hysterectomy.  . Sleep apnea 2005   resolved with ENT surgery,  Chap Mcqueen    Past Surgical History:  Procedure Laterality Date  . ABDOMINAL HYSTERECTOMY    . ARTHROSCOPIC REPAIR ACL  2006   right knee Dr Garrett at [facility]  .  CHOLECYSTECTOMY N/A 07/21/2016   Procedure: LAPAROSCOPIC CHOLECYSTECTOMY CONVERTED TO OPEN;  Surgeon: Jose Piscoya, MD;  Location: [facility] ORS;  Service: General;  Laterality: N/A;  . COLON SURGERY    . COLONOSCOPY WITH PROPOFOL N/A 12/14/2015   Procedure: COLONOSCOPY WITH PROPOFOL;  Surgeon: Robert T Elliott, MD;  Location: [facility] ENDOSCOPY;  Service: Endoscopy;  Laterality: N/A;  . Colostomy Placement    . PORTA CATH REMOVAL N/A 11/06/2016   Procedure: Porta Cath Removal;  Surgeon: Jason S Dew, MD;  Location: [facility] INVASIVE CV LAB;  Service: Cardiovascular;  Laterality: N/A;  . TONSILLECTOMY AND ADENOIDECTOMY      Family History  Problem Relation Age of Onset  . Heart attack Maternal Grandfather   . Hyperlipidemia Paternal Grandfather   . Hypertension Paternal Grandfather   . Skin cancer Maternal Grandmother     Social History:  reports that  has never smoked. she has never used smokeless tobacco. She reports that she drinks alcohol. She reports that she does not use drugs.  She lives in [place], [place], but plans on moving back to [place] where her mother and rest of her family lives.  She graduated in 08/2015.  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  She has 2 siblings.  She moved from [place] to [place].  The patient is alone today.  Allergies: No Known Allergies  Current Medications: Current Outpatient Medications  Medication Sig Dispense   Refill  . albuterol (PROVENTIL HFA;VENTOLIN HFA) 108 (90 Base) MCG/ACT inhaler Inhale 2 puffs into the lungs every 6 (six) hours as needed for wheezing or shortness of breath. 1 Inhaler 2  . cyanocobalamin 500 MCG tablet Take 500 mcg by mouth daily.     . estrogens, conjugated, (PREMARIN) 0.9 MG tablet Take 1 tablet (0.9 mg total) by mouth daily. 30 tablet 11  . methylphenidate (METADATE CD) 30 MG CR capsule Take 1 capsule (30 mg total) by mouth 2 (two) times daily. In the morning and in the afternoon 60 capsule 0  .  montelukast (SINGULAIR) 10 MG tablet Take 1 tablet (10 mg total) by mouth at bedtime. 30 tablet 3  . Multiple Vitamins-Calcium (ONE-A-DAY WOMENS PO) Take 1 tablet by mouth daily.     . Naltrexone-Bupropion HCl ER 8-90 MG TB12 One tablet in AM daily for one week,  One tablet twice daily for week 2; 2 tablets in AM and 1 tablet in PM for week 3;  2 tablets twice daily Week 4 60 tablet 0  . omega-3 acid ethyl esters (LOVAZA) 1 g capsule Take 1 g by mouth 2 (two) times daily.    . loratadine (CLARITIN) 10 MG tablet Take 10 mg by mouth daily.     No current facility-administered medications for this visit.     Review of Systems:  GENERAL:  Feels "very good".  Active.  No fevers or sweats.  Weight down 4 pounds (intentional). PERFORMANCE STATUS (ECOG):  1 HEENT:  No visual changes, runny nose, sore throat, mouth sores or tenderness. Lungs: No shortness of breath or cough.  No hemoptysis. Cardiac:  No chest pain, palpitations, orthopnea, or PND. GI:  Intermittent pain with passage of stool through colostomy (see HPI).  No nausea, vomiting, diarrhea, constipation, melena or hematochezia.  Colonoscopy planned for 11/2017. GU:  No urgency, frequency, dysuria, or hematuria.  s/p TAH/BSO on Premarin. Musculoskeletal:  No back pain.  No joint pain.  No muscle tenderness. Extremities:  No pain or swelling. Skin:  No rashes or skin changes. Neuro:  No headache, numbness or weakness, balance or coordination issues. Endocrine:  No diabetes, thyroid issues, hot flashes or night sweats. Psych:  No mood changes, depression or anxiety.  Working on therapy. Pain:  No focal pain. Review of systems:  All other systems reviewed and found to be negative.  Physical Exam: Blood pressure 127/81, pulse 69, temperature 97.7 F (36.5 C), temperature source Tympanic, resp. rate 18, weight 201 lb 2 oz (91.2 kg), last menstrual period 10/25/2011. GENERAL:  Well developed, well nourished, woman sitting comfortably in the  exam room in no acute distress. MENTAL STATUS:  Alert and oriented to person, place and time. HEAD:  Long brown hair.  Normocephalic, atraumatic, face symmetric, no Cushingoid features. EYES:  Glasses.  Blue eyes.  Pupils equal round and reactive to light and accomodation.  No conjunctivitis or scleral icterus. ENT:  Oropharynx clear without lesion.  Tongue normal. Mucous membranes moist.  RESPIRATORY:  Clear to auscultation without rales, wheezes or rhonchi. CARDIOVASCULAR:  Regular rate and rhythm without murmur, rub or gallop. ABDOMEN:  Colostomy.  Soft, non-tender, with active bowel sounds, and no appreciable hepatosplenomegaly.  No masses. SKIN:  Tan.  No rashes, ulcers or lesions. EXTREMITIES: No edema, no skin discoloration or tenderness.  No palpable cords. LYMPH NODES: No palpable cervical, supraclavicular, axillary or inguinal adenopathy  NEUROLOGICAL: Unremarkable. PSYCH:  Appropriate.   Appointment on 10/23/2017  Component Date Value Ref Range   Status  . WBC 10/23/2017 4.7  3.6 - 11.0 K/uL Final  . RBC 10/23/2017 4.32  3.80 - 5.20 MIL/uL Final  . Hemoglobin 10/23/2017 13.3  12.0 - 16.0 g/dL Final  . HCT 10/23/2017 38.6  35.0 - 47.0 % Final  . MCV 10/23/2017 89.4  80.0 - 100.0 fL Final  . MCH 10/23/2017 30.8  26.0 - 34.0 pg Final  . MCHC 10/23/2017 34.4  32.0 - 36.0 g/dL Final  . RDW 10/23/2017 13.5  11.5 - 14.5 % Final  . Platelets 10/23/2017 191  150 - 440 K/uL Final  . Neutrophils Relative % 10/23/2017 51  % Final  . Neutro Abs 10/23/2017 2.4  1.4 - 6.5 K/uL Final  . Lymphocytes Relative 10/23/2017 40  % Final  . Lymphs Abs 10/23/2017 1.9  1.0 - 3.6 K/uL Final  . Monocytes Relative 10/23/2017 6  % Final  . Monocytes Absolute 10/23/2017 0.3  0.2 - 0.9 K/uL Final  . Eosinophils Relative 10/23/2017 2  % Final  . Eosinophils Absolute 10/23/2017 0.1  0 - 0.7 K/uL Final  . Basophils Relative 10/23/2017 1  % Final  . Basophils Absolute 10/23/2017 0.0  0 - 0.1 K/uL Final    Performed at [facility], [address], [place], [place] [address]  . Sodium 10/23/2017 138  135 - 145 mmol/L Final  . Potassium 10/23/2017 3.9  3.5 - 5.1 mmol/L Final  . Chloride 10/23/2017 104  101 - 111 mmol/L Final  . CO2 10/23/2017 24  22 - 32 mmol/L Final  . Glucose, Bld 10/23/2017 106* 65 - 99 mg/dL Final  . BUN 10/23/2017 14  6 - 20 mg/dL Final  . Creatinine, Ser 10/23/2017 0.90  0.44 - 1.00 mg/dL Final  . Calcium 10/23/2017 9.2  8.9 - 10.3 mg/dL Final  . Total Protein 10/23/2017 7.2  6.5 - 8.1 g/dL Final  . Albumin 10/23/2017 4.0  3.5 - 5.0 g/dL Final  . AST 10/23/2017 30  15 - 41 U/L Final  . ALT 10/23/2017 34  14 - 54 U/L Final  . Alkaline Phosphatase 10/23/2017 75  38 - 126 U/L Final  . Total Bilirubin 10/23/2017 0.3  0.3 - 1.2 mg/dL Final  . GFR calc non Af Amer 10/23/2017 >60  >60 mL/min Final  . GFR calc Af Amer 10/23/2017 >60  >60 mL/min Final   Comment: (NOTE) The eGFR has been calculated using the CKD EPI equation. This calculation has not been validated in all clinical situations. eGFR's persistently <60 mL/min signify possible Chronic Kidney Disease.   Georgiann Hahn gap 10/23/2017 10  5 - 15 Final   Performed at [facility], [address] [place], [place] [address]    Assessment:  TAYRA DAWE is a 33 y.o. female with a history of stage IIIB (T4N1M0) rectal cancer s/p neoadjuvant radiation and 5FU (completed on 03/07/2012) followed by rectosigmoidectomy, hysterectomy and oophorectomy on 05/13/2012 at [facility].  Pathologic stage was [id number].  She began post-operative FOLFOX chemotherapy in 05/2012.  She received 11 cycles (completed in 12/2012) secondary to neutropenia.  Abdomen and pelvic CT on 10/16/2015 noted the resection for distal rectal adenocarcinoma. There was stable appearance of the lower anatomic pelvis with no new or progressive soft tissue to suggest local recurrence.  There was a parastomal hernia containing an increased amount of pericolonic  fat compared to the previous study.  Abdomen and pelvic CT on 10/15/2016 revealed no acute findings in the abdomen or pelvis.  There was no  specific features identified to suggest recurrent rectal cancer or metastatic disease.  There was left lower quadrant colostomy with fat containing parastomal hernia.  There was evidence of mild chronic inflammation of the distal colon noted up to the level of the ostomy.  CEA has been followed:  1.0 on 12/19/2013, 0.8 on 05/08/2014, 1.1 on 10/12/2015, 0.9 on 04/25/2016, 1.1 on 10/23/2016, and 1.4 on 10/23/2017.   Colonoscopy on 12/14/2015 revealed one diminutive polyp in the sigmoid colon.  Pathology revealed a tubular adenoma with no evidence of dysplasia or malignancy.  Nodules at the top of the colostomy revealed inflamed and ulcerated colocutaneous tissue.  She has no family history of colon cancer.  My Risk genetic testing was negative per patient report.  Symptomatically, she is doing well.  Exam is unremarkable. CBC and CMP unremarkable. CEA is 1.4.   Plan: 1.  Labs today:  CBC with diff, CMP, CEA. 2.  Discuss GYN onc evaluation. 3.  Follow up with GI (Dr Elliott) as scheduled in February.  Interval colonoscopy due in 11/2017.  4.  RTC in 1 year for MD assessment and labs (CBC with diff, CMP, CEA).   Bryan Gray, NP  10/23/2017, 11:11 AM   I saw and evaluated the patient, participating in the key portions of the service and reviewing pertinent diagnostic studies and records.  I reviewed the nurse practitioner's note and agree with the findings and the plan.  The assessment and plan were discussed with the patient.  A few questions were asked by the patient and answered.    , MD 10/23/2017, 11:11 AM  

## 2017-10-23 NOTE — Progress Notes (Signed)
Patient states she sometimes has pain when she is passing stool (stabbing pain) through her colostomy. Otherwise, no complaints.

## 2017-10-24 LAB — CEA: CEA: 1.4 ng/mL (ref 0.0–4.7)

## 2017-10-25 ENCOUNTER — Encounter: Payer: Self-pay | Admitting: Hematology and Oncology

## 2017-10-28 ENCOUNTER — Encounter: Payer: Self-pay | Admitting: Internal Medicine

## 2017-11-04 ENCOUNTER — Other Ambulatory Visit: Payer: Self-pay | Admitting: Internal Medicine

## 2017-11-10 DIAGNOSIS — Z85048 Personal history of other malignant neoplasm of rectum, rectosigmoid junction, and anus: Secondary | ICD-10-CM | POA: Diagnosis not present

## 2017-11-10 DIAGNOSIS — R109 Unspecified abdominal pain: Secondary | ICD-10-CM | POA: Diagnosis not present

## 2017-11-18 ENCOUNTER — Ambulatory Visit: Payer: BLUE CROSS/BLUE SHIELD | Admitting: Internal Medicine

## 2017-11-18 ENCOUNTER — Ambulatory Visit: Payer: BLUE CROSS/BLUE SHIELD | Admitting: Family Medicine

## 2017-11-18 ENCOUNTER — Other Ambulatory Visit: Payer: Self-pay

## 2017-11-18 ENCOUNTER — Encounter: Payer: Self-pay | Admitting: Family Medicine

## 2017-11-18 ENCOUNTER — Ambulatory Visit (INDEPENDENT_AMBULATORY_CARE_PROVIDER_SITE_OTHER)
Admission: RE | Admit: 2017-11-18 | Discharge: 2017-11-18 | Disposition: A | Discharge status: Home / Self Care | Payer: BLUE CROSS/BLUE SHIELD | Source: Ambulatory Visit | Attending: Family Medicine | Admitting: Family Medicine

## 2017-11-18 VITALS — BP 108/66 | HR 80 | Temp 97.7°F | Ht 62.0 in | Wt 197.5 lb

## 2017-11-18 DIAGNOSIS — M2392 Unspecified internal derangement of left knee: Secondary | ICD-10-CM

## 2017-11-18 DIAGNOSIS — M25562 Pain in left knee: Secondary | ICD-10-CM

## 2017-11-18 MED ORDER — DICLOFENAC SODIUM 75 MG PO TBEC: 75.0000 mg | DELAYED_RELEASE_TABLET | Freq: Two times a day (BID) | ORAL | 1 refills | Status: DC

## 2017-11-18 MED ORDER — TRAMADOL HCL 50 MG PO TABS: 50.0000 mg | ORAL_TABLET | Freq: Four times a day (QID) | ORAL | 0 refills | Status: AC | PRN

## 2017-11-18 MED ORDER — METHYLPREDNISOLONE ACETATE 40 MG/ML IJ SUSP
80.0000 mg | Freq: Once | INTRAMUSCULAR | Status: AC
  Administered 2017-11-18: 80 mg via INTRA_ARTICULAR

## 2017-11-18 NOTE — Progress Notes (Signed)
Dr. Frederico Hamman T. Ceria Suminski, MD, [facility] [place], [address] Phone: [phone] Fax: [phone]  11/18/2017  Patient: Rachael Cowan, MRN: [id number], DOB: 1986-05-26, 32 y.o.  Primary Physician:  [name], MD   Chief Complaint  Patient presents with  . Knee Pain    Left-Loading car yesterady and heard something pop   Subjective:   Rachael Cowan is a 32 y.o. very pleasant female patient who presents with the following:  Referring MD: Dr. 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  She felt a snap or pop in her left knee without an audible sound.  Since that time she has been limping and she does have some mild amount of an effusion.  She also is feeling some pressure posteriorly, but most of her pain is medial.  She walks into the office unassisted, and she has been able to walk ever since the time of injury.  Date of accident, November 17, 2017.  Prior knee surgical history,  ACL reconstruction of the right knee, done by Dr. Aldean Jewett at [facility].  2006.  Past Medical History, Surgical History, Social History, Family History, Problem List, Medications, and Allergies have been reviewed and updated if relevant.  Patient Active Problem List   Diagnosis Date Noted  . Well woman exam 06/17/2017  . S/P cholecystectomy 09/10/2016  . Incisional hernia, without obstruction or gangrene   . Rectal cancer ([place])   . History of hematuria 07/19/2016  . Hepatic steatosis 07/19/2016  . B12 deficiency 06/23/2013  . Obesity 06/23/2013  . Other and unspecified hyperlipidemia 06/23/2013  . Menopause 05/13/2012  . Airway hyperreactivity 05/07/2012  . Hypersomnia 05/07/2012  . History of rectal cancer 01/13/2012  . Primary hypersomnolence disorder  11/12/2011    Past Medical History:  Diagnosis Date  . Allergy   . Asthma   . Gallstones   . Hypersomnolence disorder 2005   managed with metidate  . Rectal cancer ([place]) 05/13/2012   Resection with colostomy and hysterectomy.  . Sleep apnea 2005   resolved with ENT surgery,  [name]    Past Surgical History:  Procedure Laterality Date  . ABDOMINAL HYSTERECTOMY    . ARTHROSCOPIC REPAIR ACL  2006   right knee Dr Donna Christen at [facility]  . CHOLECYSTECTOMY N/A 07/21/2016   Procedure: LAPAROSCOPIC CHOLECYSTECTOMY CONVERTED TO OPEN;  Surgeon: Olean Ree, MD;  Location: [facility] ORS;  Service: General;  Laterality: N/A;  . COLON SURGERY    . COLONOSCOPY WITH PROPOFOL N/A 12/14/2015   Procedure: COLONOSCOPY WITH PROPOFOL;  Surgeon: Manya Silvas, MD;  Location: [facility] ENDOSCOPY;  Service: Endoscopy;  Laterality: N/A;  . Colostomy Placement    . PORTA CATH REMOVAL N/A 11/06/2016   Procedure: Glori Luis Cath Removal;  Surgeon: Algernon Huxley, MD;  Location: [place] CV LAB;  Service: Cardiovascular;  Laterality: N/A;  . TONSILLECTOMY AND ADENOIDECTOMY      Social History   Socioeconomic History  . Marital status: Single    Spouse name: Not on file  . Number of children: Not on file  . Years of education: Not on file  . Highest education level: Not on file  Social Needs  . Financial resource strain: Not on file  . Food insecurity - worry:  Not on file  . Food insecurity - inability: Not on file  . Transportation needs - medical: Not on file  . Transportation needs - non-medical: Not on file  Occupational History  . Not on file  Tobacco Use  . Smoking status: Never Smoker  . Smokeless tobacco: Never Used  Substance and Sexual Activity  . Alcohol use: Yes    Comment: ocassional  . Drug use: No  . Sexual activity: No  Other Topics Concern  . 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    She is not sexually active because  she has a permanent colostomy     Family History  Problem Relation Age of Onset  . Heart attack Maternal Grandfather   . Hyperlipidemia Paternal Grandfather   . Hypertension Paternal Grandfather   . Skin cancer Maternal Grandmother     No Known Allergies  Medication list reviewed and updated in full in [place].  GEN: No fevers, chills. Nontoxic. Primarily MSK c/o today. MSK: Detailed in the HPI GI: tolerating PO intake without difficulty Neuro: No numbness, parasthesias, or tingling associated. Otherwise the pertinent positives of the ROS are noted above.   Objective:   BP 108/66   Pulse 80   Temp 97.7 F (36.5 C) (Oral)   Ht 5\' 2"  (1.575 m)   Wt 197 lb 8 oz (89.6 kg)   LMP 10/25/2011   BMI 36.12 kg/m    GEN: WDWN, NAD, Non-toxic, Alert & Oriented x 3 HEENT: Atraumatic, Normocephalic.  Ears and Nose: No external deformity. EXTR: No clubbing/cyanosis/edema NEURO: Normal gait. Antalgia. PSYCH: Normally interactive. Conversant. Not depressed or anxious appearing.  Calm demeanor.   Knee:  L Gait: Normal heel toe pattern ROM: 0-95 Effusion: mild Echymosis or edema: none Patellar tendon NT Painful PLICA: neg Patellar grind: negative Medial and lateral patellar facet loading: negative medial and lateral joint lines: medial joint line pain Mcmurray's pos for pain Flexion-pinch pos  Varus and valgus stress: stable Lachman: neg Ant and Post drawer: neg Hip abduction, IR, ER: WNL Hip flexion str: 5/5 Hip abd: 5/5 Quad: 5/5 VMO atrophy:No Hamstring concentric and eccentric: 5/5   Radiology: Dg Knee 4 Views W/patella Left  Result Date: 11/18/2017 CLINICAL DATA:  Popping sensation yesterday with medial knee pain, initial encounter EXAM: LEFT KNEE - COMPLETE 4+ VIEW COMPARISON:  None. FINDINGS: No acute fracture or dislocation is seen. No soft tissue abnormality is noted. Postsurgical changes are noted in the right knee. IMPRESSION: No acute abnormality  noted. Electronically Signed   By: Inez Catalina M.D.   On: 11/18/2017 12:02     Assessment and Plan:   Acute pain of left knee - Plan: DG Knee 4 Views W/Patella Left, methylPREDNISolone acetate (DEPO-MEDROL) injection 80 mg  Acute internal derangement of left knee  Acute knee pain and a 32 year old with injury, most likely medial meniscal tear based on exam and history.  Majority of these do do fairly well with conservative management, range of motion, strengthening, pain control.  Start with ice, basic range of motion to work on effusion.  Scheduled NSAIDs for at least the next 2-3 weeks.  Tramadol if needed for pain.  I am also going to inject her knee with some corticosteroid to give her some additional relief.  Place her in a patellar J brace for additional support with movement and ambulation.  I appreciate the opportunity to evaluate this very  friendly patient. If you have any question regarding her care or prognosis, do not hesitate to ask.   Knee Injection, L Patient verbally consented to procedure. Risks (including potential rare risk of infection), benefits, and alternatives explained. Sterilely prepped with Chloraprep. Ethyl cholride used for anesthesia. 8 cc Lidocaine 1% mixed with 2 mL Depo-Medrol 40 mg injected using the anteromedial approach without difficulty. No complications with procedure and tolerated well. Patient had decreased pain post-injection.   Follow-up: Return in about 1 month (around 12/16/2017).  Meds ordered this encounter  Medications  . diclofenac (VOLTAREN) 75 MG EC tablet    Sig: Take 1 tablet (75 mg total) by mouth 2 (two) times daily.    Dispense:  60 tablet    Refill:  1  . traMADol (ULTRAM) 50 MG tablet    Sig: Take 1 tablet (50 mg total) by mouth every 6 (six) hours as needed for up to 10 days.    Dispense:  20 tablet    Refill:  0  . methylPREDNISolone acetate (DEPO-MEDROL) injection 80 mg   Orders Placed This Encounter  Procedures  . DG  Knee 4 Views W/Patella Left    Signed,  Bhavin Monjaraz T. Elward Nocera, MD   Allergies as of 11/18/2017   No Known Allergies     Medication List        Accurate as of 11/18/17 11:59 PM. Always use your most recent med list.          albuterol 108 (90 Base) MCG/ACT inhaler Commonly known as:  PROVENTIL HFA;VENTOLIN HFA Inhale 2 puffs into the lungs every 6 (six) hours as needed for wheezing or shortness of breath.   cyanocobalamin 500 MCG tablet Take 500 mcg by mouth daily.   diclofenac 75 MG EC tablet Commonly known as:  VOLTAREN Take 1 tablet (75 mg total) by mouth 2 (two) times daily.   loratadine 10 MG tablet Commonly known as:  CLARITIN Take 10 mg by mouth daily.   methylphenidate 30 MG CR capsule Commonly known as:  METADATE CD Take 1 capsule (30 mg total) by mouth 2 (two) times daily. In the morning and in the afternoon   montelukast 10 MG tablet Commonly known as:  SINGULAIR Take 1 tablet (10 mg total) by mouth at bedtime.   Naltrexone-buPROPion HCl ER 8-90 MG Tb12 One tablet in AM daily for one week,  One tablet twice daily for week 2; 2 tablets in AM and 1 tablet in PM for week 3;  2 tablets twice daily Week 4   omega-3 acid ethyl esters 1 g capsule Commonly known as:  LOVAZA Take 1 g by mouth 2 (two) times daily.   ONE-A-DAY WOMENS PO Take 1 tablet by mouth daily.   PREMARIN 0.9 MG tablet Generic drug:  estrogens (conjugated) TAKE 1 TABLET (0.9 MG TOTAL) BY MOUTH DAILY.   traMADol 50 MG tablet Commonly known as:  ULTRAM Take 1 tablet (50 mg total) by mouth every 6 (six) hours as needed for up to 10 days.

## 2017-12-06 ENCOUNTER — Other Ambulatory Visit: Payer: Self-pay | Admitting: Internal Medicine

## 2017-12-06 DIAGNOSIS — Z6835 Body mass index (BMI) 35.0-35.9, adult: Principal | ICD-10-CM

## 2017-12-07 ENCOUNTER — Other Ambulatory Visit: Payer: Self-pay | Admitting: Internal Medicine

## 2017-12-07 DIAGNOSIS — G4710 Hypersomnia, unspecified: Secondary | ICD-10-CM

## 2017-12-07 NOTE — Telephone Encounter (Signed)
Refilled: 09/10/2017 Last OV: 07/29/2017 Next OV: 07/30/2018

## 2017-12-08 MED ORDER — METHYLPHENIDATE HCL ER (CD) 30 MG PO CPCR: 30.0000 mg | ORAL_CAPSULE | Freq: Two times a day (BID) | ORAL | 0 refills | Status: DC

## 2017-12-08 NOTE — Telephone Encounter (Signed)
Refilled: 07/29/2017 Last OV: 07/29/2017 Next OV: 07/30/2018

## 2017-12-10 ENCOUNTER — Other Ambulatory Visit: Payer: Self-pay | Admitting: *Deleted

## 2017-12-10 MED ORDER — DICLOFENAC SODIUM 75 MG PO TBEC: 75.0000 mg | DELAYED_RELEASE_TABLET | Freq: Two times a day (BID) | ORAL | 1 refills | Status: AC

## 2017-12-10 NOTE — Telephone Encounter (Signed)
Last office visit 11/18/2017.  Last refilled 11/18/2017 for #60 with 1 refill.  Pharmacy is requesting 90 day supply.  Ok to change to #180?

## 2017-12-10 NOTE — Telephone Encounter (Signed)
Pt has been notified that rx is up front and ready to be picked up. Pt stated that she would be by this afternoon to get the rx.

## 2017-12-16 ENCOUNTER — Telehealth: Payer: Self-pay | Admitting: Family Medicine

## 2017-12-16 ENCOUNTER — Encounter: Payer: Self-pay | Admitting: Family Medicine

## 2017-12-16 ENCOUNTER — Other Ambulatory Visit: Payer: Self-pay

## 2017-12-16 ENCOUNTER — Ambulatory Visit: Payer: BLUE CROSS/BLUE SHIELD | Admitting: Family Medicine

## 2017-12-16 VITALS — BP 104/70 | HR 98 | Temp 97.7°F | Ht 62.0 in | Wt 202.2 lb

## 2017-12-16 DIAGNOSIS — M25562 Pain in left knee: Secondary | ICD-10-CM | POA: Diagnosis not present

## 2017-12-16 NOTE — Progress Notes (Signed)
Dr. Frederico Hamman T. Mekhi Sonn, MD, [facility] [place], [address] Phone: [phone] Fax: [phone]  12/16/2017  Patient: Rachael Cowan, MRN: [id number], DOB: 1986-06-09, 32 y.o.  Primary Physician:  [name], MD   Chief Complaint  Patient presents with  . Follow-up    Knee Pain   Subjective:   Rachael Cowan is a 32 y.o. very pleasant female patient who presents with the following:  DOI: 11/17/2017  F/u knee pain: No daily pain, twisting will hurt a little bit.  I remember the patient quite well.  She is a Programmer, applications, and I saw her after a injury to her left knee approximately 1 month ago.  At that point she had a lot of pain as well as an effusion.  I placed her in a patellar J brace as well as gave her an intra-articular knee injection.  Also gave her some tramadol and oral Voltaren.  She is done remarkably well.  She is not really having any pain at all with day-to-day activities.  No significant mechanical symptoms.  She does have some pain, relatively mild with deep twisting movements, but it is not a hindrance.  Past Medical History, Surgical History, Social History, Family History, Problem List, Medications, and Allergies have been reviewed and updated if relevant.  Patient Active Problem List   Diagnosis Date Noted  . Well woman exam 06/17/2017  . S/P cholecystectomy 09/10/2016  . Incisional hernia, without obstruction or gangrene   . Rectal cancer ([place])   . History of hematuria 07/19/2016  . Hepatic steatosis 07/19/2016  . B12 deficiency 06/23/2013  . Obesity 06/23/2013  . Other and unspecified hyperlipidemia 06/23/2013  . Menopause 05/13/2012  . Airway hyperreactivity 05/07/2012  . Hypersomnia 05/07/2012  . History of rectal cancer 01/13/2012  . Primary hypersomnolence disorder 11/12/2011    Past Medical History:  Diagnosis Date  . Allergy   . Asthma   . Gallstones   . Hypersomnolence disorder  2005   managed with metidate  . Rectal cancer ([place]) 05/13/2012   Resection with colostomy and hysterectomy.  . Sleep apnea 2005   resolved with ENT surgery,  [name]    Past Surgical History:  Procedure Laterality Date  . ABDOMINAL HYSTERECTOMY    . ARTHROSCOPIC REPAIR ACL  2006   right knee Dr Donna Christen at [facility]  . CHOLECYSTECTOMY N/A 07/21/2016   Procedure: LAPAROSCOPIC CHOLECYSTECTOMY CONVERTED TO OPEN;  Surgeon: Olean Ree, MD;  Location: [facility] ORS;  Service: General;  Laterality: N/A;  . COLON SURGERY    . COLONOSCOPY WITH PROPOFOL N/A 12/14/2015   Procedure: COLONOSCOPY WITH PROPOFOL;  Surgeon: Manya Silvas, MD;  Location: [facility] ENDOSCOPY;  Service: Endoscopy;  Laterality: N/A;  . Colostomy Placement    . PORTA CATH REMOVAL N/A 11/06/2016   Procedure: Glori Luis Cath Removal;  Surgeon: Algernon Huxley, MD;  Location: [facility];  Service: Cardiovascular;  Laterality: N/A;  . TONSILLECTOMY AND ADENOIDECTOMY      Social History   Socioeconomic History  . Marital status: Single    Spouse name: Not on file  . Number of children: Not on file  . Years of education: Not on file  . Highest education level: Not on file  Social Needs  . Financial resource strain: Not on file  . Food insecurity - worry: Not on file  . Food insecurity - inability: Not on file  . Transportation needs -  medical: Not on file  . Transportation needs - non-medical: Not on file  Occupational History  . Not on file  Tobacco Use  . Smoking status: Never Smoker  . Smokeless tobacco: Never Used  Substance and Sexual Activity  . Alcohol use: Yes    Comment: ocassional  . Drug use: No  . Sexual activity: No  Other Topics Concern  . 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    She is not sexually active because she has a permanent colostomy     Family History  Problem Relation Age of Onset  . Heart attack Maternal Grandfather     . Hyperlipidemia Paternal Grandfather   . Hypertension Paternal Grandfather   . Skin cancer Maternal Grandmother     No Known Allergies  Medication list reviewed and updated in full in [facility].  GEN: No fevers, chills. Nontoxic. Primarily MSK c/o today. MSK: Detailed in the HPI GI: tolerating PO intake without difficulty Neuro: No numbness, parasthesias, or tingling associated. Otherwise the pertinent positives of the ROS are noted above.   Objective:   BP 104/70   Pulse 98   Temp 97.7 F (36.5 C) (Oral)   Ht 5\' 2"  (1.575 m)   Wt 202 lb 4 oz (91.7 kg)   LMP 10/25/2011   BMI 36.99 kg/m    GEN: WDWN, NAD, Non-toxic, Alert & Oriented x 3 HEENT: Atraumatic, Normocephalic.  Ears and Nose: No external deformity. EXTR: No clubbing/cyanosis/edema NEURO: Normal gait.  PSYCH: Normally interactive. Conversant. Not depressed or anxious appearing.  Calm demeanor.    Left knee: Full extension, flexion to 123 degrees.  No significant joint line pain.  No patellar crepitus.  No effusion.  Stable to varus and valgus stress.  Lachman appears negative as well as the posterior drawer testing.  McMurray's testing and bounce home testing are negative.  Flexion pinch testing is negative.  Radiology: Dg Knee 4 Views W/patella Left  Result Date: 11/18/2017 CLINICAL DATA:  Popping sensation yesterday with medial knee pain, initial encounter EXAM: LEFT KNEE - COMPLETE 4+ VIEW COMPARISON:  None. FINDINGS: No acute fracture or dislocation is seen. No soft tissue abnormality is noted. Postsurgical changes are noted in the right knee. IMPRESSION: No acute abnormality noted. Electronically Signed   By: Inez Catalina M.D.   On: 11/18/2017 12:02    Assessment and Plan:   Acute pain of left knee  Her knee looks much better by history and exam.  I think she can advance her activity level as tolerated with essentially no limitations.  She can follow-up with me only on an as-needed basis.  I  appreciate the opportunity to evaluate this very friendly patient.   Signed,  Maud Deed. Nakari Bracknell, MD   Allergies as of 12/16/2017   No Known Allergies     Medication List        Accurate as of 12/16/17 11:59 PM. Always use your most recent med list.          albuterol 108 (90 Base) MCG/ACT inhaler Commonly known as:  PROVENTIL HFA;VENTOLIN HFA Inhale 2 puffs into the lungs every 6 (six) hours as needed for wheezing or shortness of breath.   cyanocobalamin 500 MCG tablet Take 500 mcg by mouth daily.   diclofenac 75 MG EC tablet Commonly known as:  VOLTAREN Take 1 tablet (75 mg total) by mouth 2 (two) times daily.   loratadine  10 MG tablet Commonly known as:  CLARITIN Take 10 mg by mouth daily.   methylphenidate 30 MG CR capsule Commonly known as:  METADATE CD Take 1 capsule (30 mg total) by mouth 2 (two) times daily. In the morning and in the afternoon   montelukast 10 MG tablet Commonly known as:  SINGULAIR Take 1 tablet (10 mg total) by mouth at bedtime.   Naltrexone-buPROPion HCl ER 8-90 MG Tb12 One tablet in AM daily for one week,  One tablet twice daily for week 2; 2 tablets in AM and 1 tablet in PM for week 3;  2 tablets twice daily Week 4   CONTRAVE 8-90 MG Tb12 Generic drug:  Naltrexone-buPROPion HCl ER TAKE 2 TABLETS BY MOUTH TWICE A DAY   omega-3 acid ethyl esters 1 g capsule Commonly known as:  LOVAZA Take 1 g by mouth 2 (two) times daily.   ONE-A-DAY WOMENS PO Take 1 tablet by mouth daily.   PREMARIN 0.9 MG tablet Generic drug:  estrogens (conjugated) TAKE 1 TABLET (0.9 MG TOTAL) BY MOUTH DAILY.

## 2017-12-16 NOTE — Telephone Encounter (Signed)
Copied from [place] [id number]. Topic: Quick Communication - Patient Running Late >> Dec 16, 2017  9:07 AM Cleaster Corin, NT wrote: Patient called and is running 5 minutes late.  Pt. Running 5 mins. Late   Route to department's PEC pool.

## 2017-12-17 DIAGNOSIS — Z85038 Personal history of other malignant neoplasm of large intestine: Secondary | ICD-10-CM | POA: Diagnosis not present

## 2017-12-17 DIAGNOSIS — Z933 Colostomy status: Secondary | ICD-10-CM | POA: Diagnosis not present

## 2017-12-17 DIAGNOSIS — Z85048 Personal history of other malignant neoplasm of rectum, rectosigmoid junction, and anus: Secondary | ICD-10-CM | POA: Diagnosis not present

## 2017-12-18 DIAGNOSIS — Z933 Colostomy status: Secondary | ICD-10-CM | POA: Diagnosis not present

## 2018-01-10 ENCOUNTER — Other Ambulatory Visit: Payer: Self-pay | Admitting: Internal Medicine

## 2018-01-10 DIAGNOSIS — J309 Allergic rhinitis, unspecified: Secondary | ICD-10-CM

## 2018-01-20 ENCOUNTER — Other Ambulatory Visit: Payer: Self-pay | Admitting: Internal Medicine

## 2018-01-20 DIAGNOSIS — G4710 Hypersomnia, unspecified: Secondary | ICD-10-CM

## 2018-01-21 MED ORDER — METHYLPHENIDATE HCL ER (CD) 30 MG PO CPCR: 30.0000 mg | ORAL_CAPSULE | Freq: Two times a day (BID) | ORAL | 0 refills | Status: DC

## 2018-01-21 NOTE — Telephone Encounter (Signed)
Pt has been notified that rx is ready to be picked up. Pt gave a verbal understanding.

## 2018-01-21 NOTE — Telephone Encounter (Signed)
rx ok'd.  Has not been seen since 06/2017.  Looks like she cancelled an appt in 10/2017.  Needs f/u appt

## 2018-01-21 NOTE — Telephone Encounter (Signed)
Spoke with pt and she stated that she does not have enough to get her through till Dr. Allayne Butcher returns on Monday.   Refilled: 12/08/2017 Last OV: 07/29/2017 Next OV: not scheduled

## 2018-01-26 DIAGNOSIS — Z933 Colostomy status: Secondary | ICD-10-CM | POA: Diagnosis not present

## 2018-01-29 DIAGNOSIS — Z933 Colostomy status: Secondary | ICD-10-CM | POA: Diagnosis not present

## 2018-02-10 ENCOUNTER — Other Ambulatory Visit: Payer: Self-pay | Admitting: Internal Medicine

## 2018-02-10 DIAGNOSIS — Z6835 Body mass index (BMI) 35.0-35.9, adult: Principal | ICD-10-CM

## 2018-02-10 NOTE — Telephone Encounter (Signed)
Refilled: 12/07/2017 Last OV: 07/29/2017 Next OV: 07/30/2018

## 2018-02-11 DIAGNOSIS — Z933 Colostomy status: Secondary | ICD-10-CM | POA: Diagnosis not present

## 2018-03-02 ENCOUNTER — Other Ambulatory Visit: Payer: Self-pay | Admitting: Internal Medicine

## 2018-03-02 DIAGNOSIS — G4710 Hypersomnia, unspecified: Secondary | ICD-10-CM

## 2018-03-04 MED ORDER — METHYLPHENIDATE HCL ER (CD) 30 MG PO CPCR: 30.0000 mg | ORAL_CAPSULE | Freq: Two times a day (BID) | ORAL | 0 refills | Status: DC

## 2018-03-04 NOTE — Telephone Encounter (Signed)
Refills for 90 days printed

## 2018-03-04 NOTE — Telephone Encounter (Signed)
Spoke with pt to let her know that rxs have been placed up front for pickup. Pt gave a verbal understanding.

## 2018-03-04 NOTE — Telephone Encounter (Signed)
Last OV 12/16/17 Next OV 07/30/18 Last refill 01/21/18

## 2018-04-02 DIAGNOSIS — Z933 Colostomy status: Secondary | ICD-10-CM | POA: Diagnosis not present

## 2018-04-27 DIAGNOSIS — J069 Acute upper respiratory infection, unspecified: Secondary | ICD-10-CM | POA: Diagnosis not present

## 2018-05-19 ENCOUNTER — Other Ambulatory Visit: Payer: Self-pay | Admitting: Internal Medicine

## 2018-06-03 DIAGNOSIS — Z933 Colostomy status: Secondary | ICD-10-CM | POA: Diagnosis not present

## 2018-06-15 DIAGNOSIS — Z933 Colostomy status: Secondary | ICD-10-CM | POA: Diagnosis not present

## 2018-06-16 ENCOUNTER — Ambulatory Visit: Payer: BLUE CROSS/BLUE SHIELD

## 2018-06-28 ENCOUNTER — Other Ambulatory Visit: Payer: Self-pay

## 2018-06-28 DIAGNOSIS — G4710 Hypersomnia, unspecified: Secondary | ICD-10-CM

## 2018-06-29 MED ORDER — METHYLPHENIDATE HCL ER (CD) 30 MG PO CPCR: 30.0000 mg | ORAL_CAPSULE | Freq: Two times a day (BID) | ORAL | 0 refills | Status: DC

## 2018-06-29 NOTE — Telephone Encounter (Signed)
Refilled

## 2018-06-29 NOTE — Telephone Encounter (Signed)
Refilled: 05/03/2018 Last OV: 07/29/2017 Next OV: 07/30/2018

## 2018-07-13 NOTE — Progress Notes (Signed)
Gynecologic Oncology Inteval Visit   Referring Provider: Dr. Lequita Asal (Oncology)  Chief Concern: ERT and gynecologic issues  Subjective:  Rachael Cowan is a 32 y.o. female who is seen in consultation from Dr. Mike Gip for ERT and gynecologic issues.   She has been doing well over the past year. She is still taking her ERT and continues to tolerating well. She does not need Pap smears as she has had a hysterectomy.   Her breast exams are done by her PCP.   Oncology History Rachael Cowan is a pleasant female who is seen in consultation from Dr. Mike Gip for ERT and gynecologic issues.   Rachael Cowan has a history of stage IIIB rectal cancer s/p concurrent chemotherapy and radiation followed by definitive surgery as per note below.   She originally presented with rectal bleeding.  Colonoscopy on 12/31/2011 revealed a significant stricture of the distal rectum. Malignant appearing ulcerated tumor was found. There was scattered area extending from about 10 cm the anal area involving anus itself.  Tumor was [id number] (based on endoscopy [facility] ultrasound).  MRI scan of pelvis revealed invasion of the vaginal wall.  PET scan on 01/14/2012 revealed PET positive area within the rectum and distal sigmoid colon.   She was treated with 6 weeks of continuous infusion (Monday-Friday) 5-fluorouracil (5-FU) with radiation.  Radiation and chemotherapy completed on 03/07/2012.  She underwent rectosigmoidectomy, hysterectomy and oophorectomy on 05/13/2012 at [facility].  Dr. Maye Hides performed her surgery at [facility]. Pathologic stage was [id number]. MSI performed, limited sample, MSS.  She began post-operative FOLFOX chemotherapy in 05/2012.  Oxaliplatin was held in 06/2012 due to elevated liver enzymes and restarted in 07/2012.  She finished FOLFOX chemotherapy in 12/2012 (11cycles). Because of neutropenia, her last treatment was discontinued.  Abdomen and pelvic CT on 10/16/2015 noted the resection for distal rectal  adenocarcinoma. There was stable appearance of the lower anatomic pelvis with no new or progressive soft tissue to suggest local recurrence.  There was parastomal hernia containing an increased amount of pericolonic fat compared to the previous study.  Colonoscopy performed by Dr. Gaylyn Cheers on 12/14/2015 revealed one diminutive polyp in the sigmoid colon (removed).  Pathology revealed a tubular adenoma with no evidence of dysplasia or malignancy.  Nodules at the top of the colostomy revealed inflamed and ulcerated colocutaneous tissue.  She has no family history of colon cancer.  My Risk genetic testing was negative per patient report.  She has been followed by Dr. Mike Gip for her oncology issues. She was seen by Dr. Erasmo Leventhal for her postop visit in 2013 and given a prescription for oral estrogen for menopausal symptoms as well as vaginal estrogen and a dilator for vaginal stenosis. She has not been using the vaginal estrogen or dilator.   She uses premarin 0.09 mg daily dose and has good control of her hot flashes.     Problem List: Patient Active Problem List   Diagnosis Date Noted  . Well woman exam 06/17/2017  . S/P cholecystectomy 09/10/2016  . Incisional hernia, without obstruction or gangrene   . Rectal cancer (Rachael Cowan)   . History of hematuria 07/19/2016  . Hepatic steatosis 07/19/2016  . B12 deficiency 06/23/2013  . Obesity 06/23/2013  . Other and unspecified hyperlipidemia 06/23/2013  . Menopause 05/13/2012  . Airway hyperreactivity 05/07/2012  . Hypersomnia 05/07/2012  . History of rectal cancer 01/13/2012  . Primary hypersomnolence disorder 11/12/2011    Past Medical History: Past Medical History:  Diagnosis Date  .  Allergy   . Asthma   . Gallstones   . Hypersomnolence disorder 2005   managed with metidate  . Rectal cancer ([place]) 05/13/2012   Resection with colostomy and hysterectomy.  . Sleep apnea 2005   resolved with ENT surgery,  [name]    Past  Surgical History: Past Surgical History:  Procedure Laterality Date  . ABDOMINAL HYSTERECTOMY    . ARTHROSCOPIC REPAIR ACL  2006   right knee Dr Donna Christen at [facility]  . CHOLECYSTECTOMY N/A 07/21/2016   Procedure: LAPAROSCOPIC CHOLECYSTECTOMY CONVERTED TO OPEN;  Surgeon: Olean Ree, MD;  Location: [facility] ORS;  Service: General;  Laterality: N/A;  . COLON SURGERY    . COLONOSCOPY WITH PROPOFOL N/A 12/14/2015   Procedure: COLONOSCOPY WITH PROPOFOL;  Surgeon: Manya Silvas, MD;  Location: [facility] ENDOSCOPY;  Service: Endoscopy;  Laterality: N/A;  . Colostomy Placement    . PORTA CATH REMOVAL N/A 11/06/2016   Procedure: Glori Luis Cath Removal;  Surgeon: Algernon Huxley, MD;  Location: [place];  Service: Cardiovascular;  Laterality: N/A;  . TONSILLECTOMY AND ADENOIDECTOMY      Past Gynecologic History: see HPI Menarche age 8 y.o.    OB History: G0P0 OB History  No data available    Family History: Family History  Problem Relation Age of Onset  . Prostate cancer Father   . Heart attack Maternal Grandfather   . Hyperlipidemia Paternal Grandfather   . Hypertension Paternal Grandfather   . Skin cancer Maternal Grandmother     Social History: Social History   Socioeconomic History  . Marital status: Single    Spouse name: Not on file  . Number of children: Not on file  . Years of education: Not on file  . Highest education level: Not on file  Occupational History  . Not on file  Social Needs  . Financial resource strain: Not on file  . Food insecurity:    Worry: Not on file    Inability: Not on file  . Transportation needs:    Medical: Not on file    Non-medical: Not on file  Tobacco Use  . Smoking status: Never Smoker  . Smokeless tobacco: Never Used  Substance and Sexual Activity  . Alcohol use: Yes    Comment: ocassional  . Drug use: No  . Sexual activity: Never  Lifestyle  . Physical activity:    Days per week: Not on file    Minutes per session: Not on file   . Stress: Not on file  Relationships  . Social connections:    Talks on phone: Not on file    Gets together: Not on file    Attends religious service: Not on file    Active member of club or organization: Not on file    Attends meetings of clubs or organizations: Not on file    Relationship status: Not on file  . Intimate partner violence:    Fear of current or ex partner: Not on file    Emotionally abused: Not on file    Physically abused: Not on file    Forced sexual activity: Not on file  Other Topics Concern  . 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    She is not sexually active because she has a permanent colostomy     Allergies: No Known Allergies  Current Medications: Current Outpatient Medications  Medication Sig Dispense Refill  .  albuterol (PROVENTIL HFA;VENTOLIN HFA) 108 (90 Base) MCG/ACT inhaler Inhale 2 puffs into the lungs every 6 (six) hours as needed for wheezing or shortness of breath. 1 Inhaler 2  . loratadine (CLARITIN) 10 MG tablet Take 10 mg by mouth daily.    . methylphenidate (METADATE CD) 30 MG CR capsule Take 1 capsule (30 mg total) by mouth 2 (two) times daily. In the morning and in the afternoon 60 capsule 0  . montelukast (SINGULAIR) 10 MG tablet TAKE 1 TABLET BY MOUTH EVERYDAY AT BEDTIME 90 tablet 1  . PREMARIN 0.9 MG tablet TAKE 1 TABLET (0.9 MG TOTAL) BY MOUTH DAILY. 30 tablet 4  . CONTRAVE 8-90 MG TB12 TAKE 2 TABLETS BY MOUTH TWICE A DAY 120 tablet 5   No current facility-administered medications for this visit.     Review of Systems General: no complaints  HEENT: no complaints  Lungs: no complaints  Cardiac: no complaints  GI: sometimes abdominal pain when has bowel movement through colostomy  GU: urinary incontinence. Not sexually active  Musculoskeletal: no complaints  Extremities: no complaints  Skin: no complaints  Neuro: no complaints  Endocrine: no complaints   Psych: no complaints       Objective:  Physical Examination:  BP 116/77   Pulse 72   Temp 98.2 F (36.8 C) (Oral)   Resp 18   Ht '5\' 2"'  (1.575 m)   Wt 206 lb 14.4 oz (93.8 kg)   LMP 10/25/2011   BMI 37.84 kg/m    ECOG Performance Status: 0 - Asymptomatic  General appearance: alert, cooperative and appears stated age HEENT:extra ocular movement intact and sclera clear, anicteric. No enlarged thyroid.  CV: Regular rate and rhythm.  Lungs: Bilateral clear to auscultation Abdomen: soft, non-tender, without masses or organomegaly, no hernias and well healed incision, ostomy intact. Extremities: extremities normal, atraumatic, no cyanosis or edema Neurological exam reveals alert, oriented, normal speech, no focal findings or movement disorder noted.  Pelvic: exam chaperoned by nurse;  Vulva: normal appearing vulva with no masses, tenderness or lesions; Vagina: deviated posteriorly, length and width preserved. KCystocele present.  BME.  Adnexa: no masses surgically absent bilateral; Uterus: surgically absent, vaginal cuff well healed; Cervix: absent; Rectal:surgically absent      Assessment:  TIFFANNI SCARFO is a 32 y.o. female diagnosed with h/o stage IIIB rectal cancer s/p concurrent chemotherapy and radiation followed by definitive surgery XL rectosigmoidectomy with APR, hysterectomy and oophorectomy on 05/13/2012. Clinically NED. Menopause managed with ERT. Abnormal anatomy.  Urinary incontinence with cystocele, no complaints at this time.   Medical co-morbidities complicating care: chemotherapy and radiation. Body mass index is 37.84 kg/m.  Plan:   Problem List Items Addressed This Visit      Other   Menopause    Other Visit Diagnoses    Hormone replacement therapy    -  Primary      Continue ERT and she will contact us if she has issues with refills. Follow up in one year with Beckey Rutter, NP.   I personally had a face to face interaction and evaluated the patient  jointly with the NP, Ms. Beckey Rutter.  I have reviewed her history and available records and have performed the key portions of the physical exam including  abdominal exam, pelvic exam with my findings confirming those documented above by the APP.  I have discussed the case with the APP and the patient.  I agree with the above documentation, assessment and plan which was fully  formulated by me.  Counseling was completed by me.   I personally saw the patient and performed a substantive portion of this encounter in conjunction with the listed APP as documented above.  A total of 15 minutes were spent with the patient/family today; >50% was spent in education, counseling and coordination of care for endometrial cancer.   Gillis Ends, MD    CC: Dr. Lequita Asal

## 2018-07-14 ENCOUNTER — Inpatient Hospital Stay: Payer: BLUE CROSS/BLUE SHIELD | Attending: Obstetrics and Gynecology | Admitting: Obstetrics and Gynecology

## 2018-07-14 VITALS — BP 116/77 | HR 72 | Temp 98.2°F | Resp 18 | Ht 62.0 in | Wt 206.9 lb

## 2018-07-14 DIAGNOSIS — Z79890 Hormone replacement therapy (postmenopausal): Secondary | ICD-10-CM | POA: Diagnosis not present

## 2018-07-14 DIAGNOSIS — Z780 Asymptomatic menopausal state: Secondary | ICD-10-CM | POA: Diagnosis not present

## 2018-07-14 NOTE — Progress Notes (Signed)
She is doing good with hormone replacement, if she misses a dose she sometimes has hot flashes but tries to take it every day. She does have some abdominal pain at times when having Bowel movement through colostomy. She has found that is is worse when she eats fried foods but sometimes eating right still has pain.

## 2018-07-15 ENCOUNTER — Other Ambulatory Visit: Payer: Self-pay | Admitting: Internal Medicine

## 2018-07-15 DIAGNOSIS — J309 Allergic rhinitis, unspecified: Secondary | ICD-10-CM

## 2018-07-30 ENCOUNTER — Encounter: Payer: Self-pay | Admitting: Internal Medicine

## 2018-07-30 ENCOUNTER — Ambulatory Visit (INDEPENDENT_AMBULATORY_CARE_PROVIDER_SITE_OTHER): Payer: BLUE CROSS/BLUE SHIELD | Admitting: Internal Medicine

## 2018-07-30 VITALS — BP 104/60 | HR 61 | Temp 97.8°F | Resp 14 | Ht 62.0 in | Wt 203.0 lb

## 2018-07-30 DIAGNOSIS — K760 Fatty (change of) liver, not elsewhere classified: Secondary | ICD-10-CM | POA: Diagnosis not present

## 2018-07-30 DIAGNOSIS — N6320 Unspecified lump in the left breast, unspecified quadrant: Secondary | ICD-10-CM

## 2018-07-30 DIAGNOSIS — Z23 Encounter for immunization: Secondary | ICD-10-CM | POA: Diagnosis not present

## 2018-07-30 DIAGNOSIS — G4710 Hypersomnia, unspecified: Secondary | ICD-10-CM | POA: Diagnosis not present

## 2018-07-30 DIAGNOSIS — Z6835 Body mass index (BMI) 35.0-35.9, adult: Secondary | ICD-10-CM

## 2018-07-30 DIAGNOSIS — R1084 Generalized abdominal pain: Secondary | ICD-10-CM

## 2018-07-30 DIAGNOSIS — Z0001 Encounter for general adult medical examination with abnormal findings: Secondary | ICD-10-CM

## 2018-07-30 DIAGNOSIS — E538 Deficiency of other specified B group vitamins: Secondary | ICD-10-CM

## 2018-07-30 DIAGNOSIS — N6459 Other signs and symptoms in breast: Secondary | ICD-10-CM

## 2018-07-30 DIAGNOSIS — E781 Pure hyperglyceridemia: Secondary | ICD-10-CM

## 2018-07-30 DIAGNOSIS — E669 Obesity, unspecified: Secondary | ICD-10-CM

## 2018-07-30 LAB — COMPREHENSIVE METABOLIC PANEL
ALT: 31 U/L (ref 0–35)
AST: 18 U/L (ref 0–37)
Albumin: 4.2 g/dL (ref 3.5–5.2)
Alkaline Phosphatase: 70 U/L (ref 39–117)
BUN: 14 mg/dL (ref 6–23)
CO2: 24 mEq/L (ref 19–32)
Calcium: 9.4 mg/dL (ref 8.4–10.5)
Chloride: 105 mEq/L (ref 96–112)
Creatinine, Ser: 0.84 mg/dL (ref 0.40–1.20)
GFR: 84.21 mL/min (ref >60.00)
Glucose, Bld: 102 mg/dL — ABNORMAL HIGH (ref 70–99)
Potassium: 4.4 mEq/L (ref 3.5–5.1)
Sodium: 139 mEq/L (ref 135–145)
Total Bilirubin: 0.3 mg/dL (ref 0.2–1.2)
Total Protein: 6.6 g/dL (ref 6.0–8.3)

## 2018-07-30 LAB — LIPID PANEL
Cholesterol: 161 mg/dL (ref 0–200)
HDL: 56 mg/dL (ref >39.00)
NonHDL: 104.94
Total CHOL/HDL Ratio: 3
Triglycerides: 236 mg/dL — ABNORMAL HIGH (ref 0.0–149.0)
VLDL: 47.2 mg/dL — ABNORMAL HIGH (ref 0.0–40.0)

## 2018-07-30 LAB — TSH: TSH: 2.22 u[IU]/mL (ref 0.35–4.50)

## 2018-07-30 LAB — LDL CHOLESTEROL, DIRECT: Direct LDL: 61 mg/dL

## 2018-07-30 MED ORDER — METHYLPHENIDATE HCL ER (CD) 30 MG PO CPCR: ORAL_CAPSULE | ORAL | 0 refills | Status: DC

## 2018-07-30 MED ORDER — METHYLPHENIDATE HCL ER (CD) 30 MG PO CPCR: 30.0000 mg | ORAL_CAPSULE | Freq: Two times a day (BID) | ORAL | 0 refills | Status: DC

## 2018-07-30 MED ORDER — DICYCLOMINE HCL 10 MG PO CAPS: 10.0000 mg | ORAL_CAPSULE | Freq: Three times a day (TID) | ORAL | 0 refills | Status: DC

## 2018-07-30 NOTE — Patient Instructions (Signed)
You received your TDaP vaccine AND your flu vaccine  PLAN ON FEELING ACHEY FOR THE NEXT 2 DAYS.   In fact,  Go ahead and take tylenol and motrin when you get home !   Trial of generic Bentyl for the abdominal cramping.  You can take it before meals that typically result in cramping,  Or you  Can try using it "prn" once the cramping starts Max dose 4 times daily   YOU NEED 30 MINUTES OF EXERCISE DAILY TO ACHIEVE YOUR WEIGHT LOSS GOALS  I AM ORDERING A MAMMOGRAM    Preventive Care 18-39 Years, Female Preventive care refers to lifestyle choices and visits with your health care provider that can promote health and wellness. What does preventive care include?  A yearly physical exam. This is also called an annual well check.  Dental exams once or twice a year.  Routine eye exams. Ask your health care provider how often you should have your eyes checked.  Personal lifestyle choices, including: ? Daily care of your teeth and gums. ? Regular physical activity. ? Eating a healthy diet. ? Avoiding tobacco and drug use. ? Limiting alcohol use. ? Practicing safe sex. ? Taking vitamin and mineral supplements as recommended by your health care provider. What happens during an annual well check? The services and screenings done by your health care provider during your annual well check will depend on your age, overall health, lifestyle risk factors, and family history of disease. Counseling Your health care provider may ask you questions about your:  Alcohol use.  Tobacco use.  Drug use.  Emotional well-being.  Home and relationship well-being.  Sexual activity.  Eating habits.  Work and work Statistician.  Method of birth control.  Menstrual cycle.  Pregnancy history.  Screening You may have the following tests or measurements:  Height, weight, and BMI.  Diabetes screening. This is done by checking your blood sugar (glucose) after you have not eaten for a while  (fasting).  Blood pressure.  Lipid and cholesterol levels. These may be checked every 5 years starting at age 67.  Skin check.  Hepatitis C blood test.  Hepatitis B blood test.  Sexually transmitted disease (STD) testing.  BRCA-related cancer screening. This may be done if you have a family history of breast, ovarian, tubal, or peritoneal cancers.  Pelvic exam and Pap test. This may be done every 3 years starting at age 81. Starting at age 75, this may be done every 5 years if you have a Pap test in combination with an HPV test.  Discuss your test results, treatment options, and if necessary, the need for more tests with your health care provider. Vaccines Your health care provider may recommend certain vaccines, such as:  Influenza vaccine. This is recommended every year.  Tetanus, diphtheria, and acellular pertussis (Tdap, Td) vaccine. You may need a Td booster every 10 years.  Varicella vaccine. You may need this if you have not been vaccinated.  HPV vaccine. If you are 12 or younger, you may need three doses over 6 months.  Measles, mumps, and rubella (MMR) vaccine. You may need at least one dose of MMR. You may also need a second dose.  Pneumococcal 13-valent conjugate (PCV13) vaccine. You may need this if you have certain conditions and were not previously vaccinated.  Pneumococcal polysaccharide (PPSV23) vaccine. You may need one or two doses if you smoke cigarettes or if you have certain conditions.  Meningococcal vaccine. One dose is recommended if you  are age 51-21 years and a first-year college student living in a residence hall, or if you have one of several medical conditions. You may also need additional booster doses.  Hepatitis A vaccine. You may need this if you have certain conditions or if you travel or work in places where you may be exposed to hepatitis A.  Hepatitis B vaccine. You may need this if you have certain conditions or if you travel or work in  places where you may be exposed to hepatitis B.  Haemophilus influenzae type b (Hib) vaccine. You may need this if you have certain risk factors.  Talk to your health care provider about which screenings and vaccines you need and how often you need them. This information is not intended to replace advice given to you by your health care provider. Make sure you discuss any questions you have with your health care provider. Document Released: 11/11/2001 Document Revised: 06/04/2016 Document Reviewed: 07/17/2015 Elsevier Interactive Patient Education  [employer].

## 2018-07-30 NOTE — Progress Notes (Signed)
Patient ID: Rachael Cowan, female    DOB: 1986/06/11  Age: 32 y.o. MRN: [id number]  The patient is here for annual prevetive examination and management of other chronic and acute problems.  S/p TAH /BSO 2013   On HRT managed by Oncology  H/o rectal CA S/p rectosigmoidectomy with colostomy permanent 2013 Last colo 2019 by : Vira Agar tubular adenoma  In  2017 but none on 2019 .  2 yr follow up advised   No prior mammogram   Due for tdap and flu    The risk factors are reflected in the social history.  The roster of all physicians providing medical care to patient - is listed in the Snapshot section of the chart.  Activities of daily living:  The patient is 100% independent in all ADLs: dressing, toileting, feeding as well as independent mobility  Home safety : The patient has smoke detectors in the home. They wear seatbelts.  There are no firearms at home. There is no violence in the home.   There is no risks for hepatitis, STDs or HIV. There is no   history of blood transfusion. They have no travel history to infectious disease endemic areas of the world.  The patient has seen their dentist in the last six months. They have seen their eye doctor in the last year. She denies  hearing difficulty  And does not  have excessive sun exposure. Discussed the need for sun protection: hats, long sleeves and use of sunscreen if there is significant sun exposure.   Diet: the importance of a healthy diet is discussed. She is actively trying to lose weight using the Weight Watchers program and her diet is healthy.  The benefits of regular aerobic exercise were discussed. She walks 4 times per week ,  20 minutes.   Depression screen: there are no signs or vegative symptoms of depression- irritability, change in appetite, anhedonia, sadness/tearfullness.   The following portions of the patient's history were reviewed and updated as appropriate: allergies, current medications, past family history, past  medical history,  past surgical history, past social history  and problem list.  Visual acuity was not assessed per patient preference since she has regular follow up with her ophthalmologist. Hearing and body mass index were assessed and reviewed.   During the course of the visit the patient was educated and counseled about appropriate screening and preventive services including : fall prevention , diabetes screening, nutrition counseling, colorectal cancer screening, and recommended immunizations.    CC: The primary encounter diagnosis was Encounter for general adult medical examination with abnormal findings. Diagnoses of Hypertriglyceridemia, Obesity (BMI 30-39.9), Need for tetanus, diphtheria, and acellular pertussis (Tdap) vaccine, Breast mass, left, Hypersomnia, Need for immunization against influenza, Hepatic steatosis, Class 2 severe obesity due to excess calories with serious comorbidity and body mass index (BMI) of 35.0 to 35.9 in adult [name]), Generalized abdominal cramping, B12 deficiency, and Abnormal breast exam were also pertinent to this visit.  1) obesity highest  wt was 228 personal best was 193 in March 2018. Saxenda,  Contrave trials not successful,  Started weight watchers 3 weeks ago,  Has lost 3 lbs   Not exercising regularly yet   2) ADD; needs Metadate refilled.    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  Has a history of traumatic ACL tear on right  knee, thinks her recent left knee injury might have been due to similar medical issue.  No tear identified by sports medicine but no MRI done   4) Recurrent episodes of abdominal cramping, sometimes associated with eating,  sometines associated with stooling.   History Rachael Cowan has a past medical history of Allergy, Asthma, Gallstones, Hypersomnolence disorder (2005), Rectal cancer ([place]) (05/13/2012), and Sleep apnea  (2005).   She has a past surgical history that includes Arthroscopic repair ACL (2006); Abdominal hysterectomy; Colon surgery; Colonoscopy with propofol (N/A, 12/14/2015); Colostomy Placement; Tonsillectomy and adenoidectomy; PORTA CATH REMOVAL (N/A, 11/06/2016); and Cholecystectomy (N/A, 07/21/2016).   Her family history includes Heart attack in her maternal grandfather; Hyperlipidemia in her paternal grandfather; Hypertension in her paternal grandfather; Prostate cancer in her father; Skin cancer in her maternal grandmother.She reports that she has never smoked. She has never used smokeless tobacco. She reports that she drinks alcohol. She reports that she does not use drugs.  Outpatient Medications Prior to Visit  Medication Sig Dispense Refill  . albuterol (PROVENTIL HFA;VENTOLIN HFA) 108 (90 Base) MCG/ACT inhaler Inhale 2 puffs into the lungs every 6 (six) hours as needed for wheezing or shortness of breath. 1 Inhaler 2  . loratadine (CLARITIN) 10 MG tablet Take 10 mg by mouth daily.    . montelukast (SINGULAIR) 10 MG tablet TAKE 1 TABLET BY MOUTH EVERYDAY AT BEDTIME 90 tablet 1  . PREMARIN 0.9 MG tablet TAKE 1 TABLET (0.9 MG TOTAL) BY MOUTH DAILY. 30 tablet 4  . methylphenidate (METADATE CD) 30 MG CR capsule Take 1 capsule (30 mg total) by mouth 2 (two) times daily. In the morning and in the afternoon 60 capsule 0  . CONTRAVE 8-90 MG TB12 TAKE 2 TABLETS BY MOUTH TWICE A DAY (Patient not taking: Reported on 07/30/2018) 120 tablet 5   No facility-administered medications prior to visit.     Review of Systems   Patient denies headache, fevers, malaise, unintentional weight loss, skin rash, eye pain, sinus congestion and sinus pain, sore throat, dysphagia,  hemoptysis , cough, dyspnea, wheezing, chest pain, palpitations, orthopnea, edema, abdominal pain, nausea, melena, diarrhea, constipation, flank pain, dysuria, hematuria, urinary  Frequency, nocturia, numbness, tingling, seizures,  Focal  weakness, Loss of consciousness,  Tremor, insomnia, depression, anxiety, and suicidal ideation.      Objective:  BP 104/60 (BP Location: Left Arm, Patient Position: Sitting, Cuff Size: Large)   Pulse 61   Temp 97.8 F (36.6 C) (Oral)   Resp 14   Ht 5\' 2"  (1.575 m)   Wt 203 lb (92.1 kg)   LMP 10/25/2011   SpO2 96%   BMI 37.13 kg/m   Physical Exam   General appearance: alert, cooperative and appears stated age Head: Normocephalic, without obvious abnormality, atraumatic Eyes: conjunctivae/corneas clear. PERRL, EOM's intact. Fundi benign. Ears: normal TM's and external ear canals both ears Nose: Nares normal. Septum midline. Mucosa normal. No drainage or sinus tenderness. Throat: lips, mucosa, and tongue normal; teeth and gums normal Neck: no adenopathy, no carotid bruit, no JVD, supple, symmetrical, trachea midline and thyroid not enlarged, symmetric, no tenderness/mass/nodules Lungs: clear to auscultation bilaterally Breasts: left breast more nodular than right,  Increased dense area above nipple at 12:00 position . No dimpling or asymmetric appearance Heart: regular rate and rhythm, S1, S2 normal, no murmur, click, rub or gallop Abdomen: soft, non-tender; bowel sounds normal; no masses,  no organomegaly Extremities: extremities normal, atraumatic, no cyanosis or edema.  NO KNEE EFFUSION  Pulses: 2+ and  symmetric Skin: Skin color, texture, turgor normal. No rashes or lesions Neurologic: Alert and oriented X 3, normal strength and tone. Normal symmetric reflexes. Normal coordination and gait.      Assessment & Plan:   Problem List Items Addressed This Visit    Abnormal breast exam    Diagnostic mammogram ordered despite young age due to nodularity on exam and history of rectal CA and use of HRT .  No prior mammogram       B12 deficiency    Secondary to colectomy.  Continue IM injections       Encounter for general adult medical examination with abnormal findings -  Primary    Diagnostic mammogram ordered to evaluate nodularity noted in left breast.       Generalized abdominal cramping    Chronic, without obvious cause.  Has colonoscopy every 2 years for history of rectal CA .Trial of Bentyl offered       Hepatic steatosis    Presumed by ultrasound changes and negative serologies to rule out autoimmune causes of hepatitis.  Current liver enzymes are normal and all modifiable risk factors including obesity, screening for diabetes and hyperlipidemia have been addressed   Lab Results  Component Value Date   ALT 31 07/30/2018   AST 18 07/30/2018   ALKPHOS 70 07/30/2018   BILITOT 0.3 07/30/2018         Hypersomnia    Secondary to narcolepsy. Previously managed by Dr. Tamala Julian at the [facility] with Metadate.   Refills given for 3 months       Relevant Medications   methylphenidate (METADATE CD) 30 MG CR capsule   Obesity    I have addressed  BMI and recommended a low glycemic index diet utilizing smaller more frequent meals to increase metabolism.  I have also recommended that patient start exercising with a goal of 30 minutes of aerobic exercise a minimum of 5 days per week. She is actively trying and recently employed portion size reduction and Weight Watchers program       Relevant Medications   methylphenidate (METADATE CD) 30 MG CR capsule   methylphenidate (METADATE CD) 30 MG CR capsule (Start on 08/29/2018)   methylphenidate (METADATE CD) 30 MG CR capsule (Start on 09/28/2018)    Other Visit Diagnoses    Hypertriglyceridemia       Relevant Orders   Lipid panel (Completed)   Obesity (BMI 30-39.9)       Relevant Medications   methylphenidate (METADATE CD) 30 MG CR capsule   methylphenidate (METADATE CD) 30 MG CR capsule (Start on 08/29/2018)   methylphenidate (METADATE CD) 30 MG CR capsule (Start on 09/28/2018)   Other Relevant Orders   Comprehensive metabolic panel (Completed)   TSH (Completed)   Need for  tetanus, diphtheria, and acellular pertussis (Tdap) vaccine       Relevant Orders   Tdap vaccine greater than or equal to 7yo IM (Completed)   Breast mass, left       Relevant Orders   MM Digital Diagnostic Bilat   US BREAST COMPLETE UNI LEFT INC AXILLA   Need for immunization against influenza       Relevant Orders   Flu Vaccine QUAD 36+ mos IM (Completed)      I have discontinued Allex L. Lamia's CONTRAVE. I have also changed her methylphenidate. Additionally, I am having her start on dicyclomine. Lastly, I am having her maintain her loratadine, albuterol, PREMARIN, montelukast, methylphenidate, and methylphenidate.  Meds ordered this encounter  Medications  . dicyclomine (BENTYL) 10 MG capsule    Sig: Take 1 capsule (10 mg total) by mouth 4 (four) times daily -  before meals and at bedtime.    Dispense:  90 capsule    Refill:  0  . methylphenidate (METADATE CD) 30 MG CR capsule    Sig: Take 1 capsule (30 mg total) by mouth 2 (two) times daily. In the morning and in the afternoon    Dispense:  60 capsule    Refill:  0  . methylphenidate (METADATE CD) 30 MG CR capsule    Sig: TAKE 1 CAPSULE BY ORAL ROUTE EVERY DAY IN THE MORNING AND IN THE AFTERNOON    Dispense:  60 capsule    Refill:  0  . methylphenidate (METADATE CD) 30 MG CR capsule    Sig: Take 1 capsule (30 mg total) by mouth 2 (two) times daily. In the morning and in the afternoon    Dispense:  60 capsule    Refill:  0    Medications Discontinued During This Encounter  Medication Reason  . CONTRAVE 8-90 MG TB12 Error  . methylphenidate (METADATE CD) 30 MG CR capsule Reorder    Follow-up: No follow-ups on file.   [name], MD

## 2018-08-01 ENCOUNTER — Encounter: Payer: Self-pay | Admitting: Internal Medicine

## 2018-08-01 DIAGNOSIS — N6459 Other signs and symptoms in breast: Secondary | ICD-10-CM | POA: Insufficient documentation

## 2018-08-01 NOTE — Assessment & Plan Note (Signed)
Diagnostic mammogram ordered to evaluate nodularity noted in left breast.

## 2018-08-01 NOTE — Assessment & Plan Note (Signed)
Chronic, without obvious cause.  Has colonoscopy every 2 years for history of rectal CA .Trial of Bentyl offered

## 2018-08-01 NOTE — Assessment & Plan Note (Signed)
labs are normal, except for mild elevation of triglycerides.  This should improve with a low glycemic index diet and Exercise . Repeat annually    Lab Results  Component Value Date   CHOL 161 07/30/2018   HDL 56.00 07/30/2018   LDLDIRECT 61.0 07/30/2018   TRIG 236.0 (H) 07/30/2018   CHOLHDL 3 07/30/2018

## 2018-08-01 NOTE — Assessment & Plan Note (Signed)
I have addressed  BMI and recommended a low glycemic index diet utilizing smaller more frequent meals to increase metabolism.  I have also recommended that patient start exercising with a goal of 30 minutes of aerobic exercise a minimum of 5 days per week. She is actively trying and recently employed portion size reduction and Weight Watchers program

## 2018-08-01 NOTE — Assessment & Plan Note (Signed)
Secondary to colectomy.  Continue IM injections  

## 2018-08-01 NOTE — Assessment & Plan Note (Signed)
Presumed by ultrasound changes and negative serologies to rule out autoimmune causes of hepatitis.  Current liver enzymes are normal and all modifiable risk factors including obesity, screening for diabetes and hyperlipidemia have been addressed   Lab Results  Component Value Date   ALT 31 07/30/2018   AST 18 07/30/2018   ALKPHOS 70 07/30/2018   BILITOT 0.3 07/30/2018

## 2018-08-01 NOTE — Assessment & Plan Note (Signed)
Diagnostic mammogram ordered despite young age due to nodularity on exam and history of rectal CA and use of HRT .  No prior mammogram

## 2018-08-01 NOTE — Assessment & Plan Note (Signed)
Secondary to narcolepsy. Previously managed by Dr. Tamala Julian at the [facility] with Metadate.   Refills given for 3 months

## 2018-08-20 ENCOUNTER — Other Ambulatory Visit: Payer: Self-pay | Admitting: Internal Medicine

## 2018-08-21 ENCOUNTER — Other Ambulatory Visit: Payer: Self-pay | Admitting: Internal Medicine

## 2018-08-31 DIAGNOSIS — Z933 Colostomy status: Secondary | ICD-10-CM | POA: Diagnosis not present

## 2018-09-16 ENCOUNTER — Other Ambulatory Visit: Payer: Self-pay | Admitting: Internal Medicine

## 2018-10-10 ENCOUNTER — Other Ambulatory Visit: Payer: Self-pay | Admitting: Internal Medicine

## 2018-10-18 ENCOUNTER — Telehealth: Payer: Self-pay

## 2018-10-18 DIAGNOSIS — N6320 Unspecified lump in the left breast, unspecified quadrant: Secondary | ICD-10-CM

## 2018-10-18 NOTE — Telephone Encounter (Signed)
Orders have been corrected.  

## 2018-10-21 ENCOUNTER — Other Ambulatory Visit: Payer: BLUE CROSS/BLUE SHIELD

## 2018-10-22 ENCOUNTER — Inpatient Hospital Stay: Payer: BLUE CROSS/BLUE SHIELD | Admitting: Hematology and Oncology

## 2018-10-22 ENCOUNTER — Inpatient Hospital Stay: Payer: BLUE CROSS/BLUE SHIELD

## 2018-10-26 DIAGNOSIS — R05 Cough: Secondary | ICD-10-CM | POA: Diagnosis not present

## 2018-10-26 DIAGNOSIS — J0140 Acute pansinusitis, unspecified: Secondary | ICD-10-CM | POA: Diagnosis not present

## 2018-10-26 DIAGNOSIS — H60552 Acute reactive otitis externa, left ear: Secondary | ICD-10-CM | POA: Diagnosis not present

## 2018-11-03 ENCOUNTER — Other Ambulatory Visit: Payer: Self-pay

## 2018-11-03 DIAGNOSIS — C20 Malignant neoplasm of rectum: Secondary | ICD-10-CM

## 2018-11-03 NOTE — Progress Notes (Signed)
[facility] day:  11/04/18  Chief Complaint: Rachael Cowan is a 33 y.o. female with a history of stage IIIB rectal cancer who is seen for 1 year assessment.  HPI:  The patient was last seen in the medical oncology clinic on  10/23/2017.  At that time, she was doing well.  Exam was unremarkable. CBC and CMP were unremarkable. CEA was 1.4.  She was seen by Tammi Klippel, PA, at the [facility] on 11/10/2017.  Notes reviewed.  She described some abdominal cramping for which she was prescribed a trial of SL hyoscyamine 0.125-0.25 mg q 4-6 hrs prn.  Triggers for cramping included fatty or greasy foods.  She was scheduled for colonoscopy on 12/17/2017.  She saw Dr. Theora Gianotti on 07/14/2018.  Notes reviewed.  Menopause managed with ERT.  Urinary incontinence with cystocele, no complaints.  She is scheduled for follow-up with Beckey Rutter, NP, in 1 year.  During the interim, patient has been having continued GI issues. She has peri-stomal "contractions".  She is taking dicyclomine BID, which "slows things down" and causes her to have constipation. Patient denies bleeding; no hematochezia, melena, or gross hematuria. Patient denies that she has experienced any B symptoms. She denies any interval infections.   Patient advises that she maintains an adequate appetite. She is eating well. Weight today is 210 lb 13.9 oz (95.7 kg), which compared to her last visit to the clinic, represents a 9 pound increase.   Patient denies pain in the clinic today.   Past Medical History:  Diagnosis Date  . Allergy   . Asthma   . Gallstones   . Hypersomnolence disorder 2005   managed with metidate  . Rectal cancer ([place]) 05/13/2012   Resection with colostomy and hysterectomy.  . Sleep apnea 2005   resolved with ENT surgery,  Rachael Cowan    Past Surgical History:  Procedure Laterality Date  . ABDOMINAL HYSTERECTOMY    . ARTHROSCOPIC REPAIR ACL  2006   right  knee Dr Donna Christen at [facility]  . CHOLECYSTECTOMY N/A 07/21/2016   Procedure: LAPAROSCOPIC CHOLECYSTECTOMY CONVERTED TO OPEN;  Surgeon: Olean Ree, MD;  Location: [facility] ORS;  Service: General;  Laterality: N/A;  . COLON SURGERY    . COLONOSCOPY WITH PROPOFOL N/A 12/14/2015   Procedure: COLONOSCOPY WITH PROPOFOL;  Surgeon: Manya Silvas, MD;  Location: [facility] ENDOSCOPY;  Service: Endoscopy;  Laterality: N/A;  . Colostomy Placement    . PORTA CATH REMOVAL N/A 11/06/2016   Procedure: Glori Luis Cath Removal;  Surgeon: Algernon Huxley, MD;  Location: [place] [facility];  Service: Cardiovascular;  Laterality: N/A;  . TONSILLECTOMY AND ADENOIDECTOMY      Family History  Problem Relation Age of Onset  . Prostate cancer Father   . Heart attack Maternal Grandfather   . Hyperlipidemia Paternal Grandfather   . Hypertension Paternal Grandfather   . Skin cancer Maternal Grandmother     Social History:  reports that she has never smoked. She has never used smokeless tobacco. She reports current alcohol use. She reports that she does not use drugs.  She lives in [place], [place], but plans on moving back to [place] where her mother and rest of her family lives.  She graduated in 08/2015.  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  She has 2 siblings.  She moved from [place] to [place].  The patient is alone today.  Allergies: No Known Allergies  Current  Medications: Current Outpatient Medications  Medication Sig Dispense Refill  . albuterol (PROVENTIL HFA;VENTOLIN HFA) 108 (90 Base) MCG/ACT inhaler Inhale 2 puffs into the lungs every 6 (six) hours as needed for wheezing or shortness of breath. 1 Inhaler 2  . dicyclomine (BENTYL) 10 MG capsule TAKE 1 CAPSULE (10 MG TOTAL) BY MOUTH 4 (FOUR) TIMES DAILY - BEFORE MEALS AND AT BEDTIME. 90 capsule 0  . methylphenidate (METADATE CD) 30 MG CR capsule Take 1 capsule (30 mg total) by mouth 2 (two) times daily. In the morning and in the afternoon 60  capsule 0  . montelukast (SINGULAIR) 10 MG tablet TAKE 1 TABLET BY MOUTH EVERYDAY AT BEDTIME 90 tablet 1  . PREMARIN 0.9 MG tablet TAKE 1 TABLET (0.9 MG TOTAL) BY MOUTH DAILY. 90 tablet 0  . loratadine (CLARITIN) 10 MG tablet Take 10 mg by mouth daily.     No current facility-administered medications for this visit.     Review of Systems:  GENERAL:  Feels "ok".  No fevers, sweats.  Weight up 9 pounds. PERFORMANCE STATUS (ECOG):  1 HEENT:  No visual changes, runny nose, sore throat, mouth sores or tenderness. Lungs: No shortness of breath or cough.  No hemoptysis. Cardiac:  No chest pain, palpitations, orthopnea, or PND. GI:  Peri-stomal contractions.  Intermittent constipation.  No nausea, vomiting, diarrhea, melena or hematochezia. GU:  Urinary incontinence.  No urgency, frequency, dysuria, or hematuria. Musculoskeletal:  No back pain.  No joint pain.  No muscle tenderness. Extremities:  No pain or swelling. Skin:  No rashes or skin changes. Neuro:  No headache, numbness or weakness, balance or coordination issues. Endocrine:  No diabetes, thyroid issues, hot flashes or night sweats. Psych:  No mood changes, depression or anxiety. Pain:  No focal pain. Review of systems:  All other systems reviewed and found to be negative.   Physical Exam: Blood pressure 116/79, pulse 82, temperature 97.7 F (36.5 C), temperature source Oral, resp. rate 16, weight 210 lb 13.9 oz (95.7 kg), last menstrual period 10/25/2011, SpO2 98 %. GENERAL:  Well developed, well nourished, woman sitting comfortably in the exam room in no acute distress. MENTAL STATUS:  Alert and oriented to person, place and time. HEAD:  Long brown hair with blonde highlights.  Normocephalic, atraumatic, face symmetric, no Cushingoid features. EYES:  Blue eyes.  Pupils equal round and reactive to light and accomodation.  No conjunctivitis or scleral icterus. ENT:  Oropharynx clear without lesion.  Tongue normal. Mucous membranes  moist.  RESPIRATORY:  Clear to auscultation without rales, wheezes or rhonchi. CARDIOVASCULAR:  Regular rate and rhythm without murmur, rub or gallop. ABDOMEN:  Colostomy.  Soft, non-tender, with active bowel sounds, and no hepatosplenomegaly.  No masses. SKIN:  No rashes, ulcers or lesions. EXTREMITIES: No edema, no skin discoloration or tenderness.  No palpable cords. LYMPH NODES: No palpable cervical, supraclavicular, axillary or inguinal adenopathy  NEUROLOGICAL: Unremarkable. PSYCH:  Appropriate.    Appointment on 11/04/2018  Component Date Value Ref Range Status  . Sodium 11/04/2018 133* 135 - 145 mmol/L Final  . Potassium 11/04/2018 4.1  3.5 - 5.1 mmol/L Final  . Chloride 11/04/2018 101  98 - 111 mmol/L Final  . CO2 11/04/2018 21* 22 - 32 mmol/L Final  . Glucose, Bld 11/04/2018 104* 70 - 99 mg/dL Final  . BUN 11/04/2018 18  6 - 20 mg/dL Final  . Creatinine, Ser 11/04/2018 0.75  0.44 - 1.00 mg/dL Final  . Calcium 11/04/2018 9.1  8.9 -  10.3 mg/dL Final  . Total Protein 11/04/2018 7.2  6.5 - 8.1 g/dL Final  . Albumin 11/04/2018 4.0  3.5 - 5.0 g/dL Final  . AST 11/04/2018 20  15 - 41 U/L Final  . ALT 11/04/2018 29  0 - 44 U/L Final  . Alkaline Phosphatase 11/04/2018 78  38 - 126 U/L Final  . Total Bilirubin 11/04/2018 0.6  0.3 - 1.2 mg/dL Final  . GFR calc non Af Amer 11/04/2018 >60  >60 mL/min Final  . GFR calc Af Amer 11/04/2018 >60  >60 mL/min Final  . Anion gap 11/04/2018 11  5 - 15 Final   Performed at [facility], [address] [place], [place] [address]  . WBC 11/04/2018 6.2  4.0 - 10.5 K/uL Final  . RBC 11/04/2018 4.44  3.87 - 5.11 MIL/uL Final  . Hemoglobin 11/04/2018 13.4  12.0 - 15.0 g/dL Final  . HCT 11/04/2018 39.3  36.0 - 46.0 % Final  . MCV 11/04/2018 88.5  80.0 - 100.0 fL Final  . MCH 11/04/2018 30.2  26.0 - 34.0 pg Final  . MCHC 11/04/2018 34.1  30.0 - 36.0 g/dL Final  . RDW 11/04/2018 13.4  11.5 - 15.5 % Final  . Platelets 11/04/2018 210   150 - 400 K/uL Final  . nRBC 11/04/2018 0.0  0.0 - 0.2 % Final  . Neutrophils Relative % 11/04/2018 55  % Final  . Neutro Abs 11/04/2018 3.4  1.7 - 7.7 K/uL Final  . Lymphocytes Relative 11/04/2018 36  % Final  . Lymphs Abs 11/04/2018 2.2  0.7 - 4.0 K/uL Final  . Monocytes Relative 11/04/2018 6  % Final  . Monocytes Absolute 11/04/2018 0.3  0.1 - 1.0 K/uL Final  . Eosinophils Relative 11/04/2018 1  % Final  . Eosinophils Absolute 11/04/2018 0.1  0.0 - 0.5 K/uL Final  . Basophils Relative 11/04/2018 1  % Final  . Basophils Absolute 11/04/2018 0.1  0.0 - 0.1 K/uL Final  . Immature Granulocytes 11/04/2018 1  % Final  . Abs Immature Granulocytes 11/04/2018 0.06  0.00 - 0.07 K/uL Final   Performed at [facility], [address] [place], [place] [address]    Assessment:  ALEAHA FICKLING is a 33 y.o. female with a history of stage IIIB (T4N1M0) rectal cancer s/p neoadjuvant radiation and 5FU (completed on 03/07/2012) followed by rectosigmoidectomy, hysterectomy and oophorectomy on 05/13/2012 at [facility].  Pathologic stage was [id number].  She began post-operative FOLFOX chemotherapy in 05/2012.  She received 11 cycles (completed in 12/2012) secondary to neutropenia.  Abdomen and pelvic CT on 10/16/2015 noted the resection for distal rectal adenocarcinoma. There was stable appearance of the lower anatomic pelvis with no new or progressive soft tissue to suggest local recurrence.  There was a parastomal hernia containing an increased amount of pericolonic fat compared to the previous study.  Abdomen and pelvic CT on 10/15/2016 revealed no acute findings in the abdomen or pelvis.  There was no specific features identified to suggest recurrent rectal cancer or metastatic disease.  There was left lower quadrant colostomy with fat containing parastomal hernia.  There was evidence of mild chronic inflammation of the distal colon noted up to the level of the ostomy.  CEA has been followed:  1.0 on  12/19/2013, 0.8 on 05/08/2014, 1.1 on 10/12/2015, 0.9 on 04/25/2016, 1.1 on 10/23/2016, 1.4 on 10/23/2017, and 1.2 on 11/04/2018.   Colonoscopy on 12/14/2015 revealed one diminutive polyp in the sigmoid colon.  Pathology  revealed a tubular adenoma with no evidence of dysplasia or malignancy.  Nodules at the top of the colostomy revealed inflamed and ulcerated colocutaneous tissue.  She has no family history of colon cancer.  My Risk genetic testing was negative per patient report.  Symptomatically,  she notes peri-stomal "contractions".  Exam is unremarkable.  Labs are normal.   Plan: 1.   Labs today:  CBC with diff, CMP, CEA. 2.   Stage IIIB rectal cancer  Clinically, she is doing well.  CEA is 1.2 (normal).  Obtain colonoscopy records from [facility]. 3.   RTC in 1 year for MD assessment and labs (CBC with diff, CMP, CEA).   Honor Loh, NP  11/04/2018, 9:46 AM   I saw and evaluated the patient, participating in the key portions of the service and reviewing pertinent diagnostic studies and records.  I reviewed the nurse practitioner's note and agree with the findings and the plan.  The assessment and plan were discussed with the patient.  Several questions were asked by the patient and answered.   Nolon Stalls, MD 11/04/2018, 9:46 AM

## 2018-11-04 ENCOUNTER — Encounter: Payer: Self-pay | Admitting: Hematology and Oncology

## 2018-11-04 ENCOUNTER — Inpatient Hospital Stay: Payer: BLUE CROSS/BLUE SHIELD | Attending: Hematology and Oncology | Admitting: Hematology and Oncology

## 2018-11-04 ENCOUNTER — Telehealth: Payer: Self-pay

## 2018-11-04 ENCOUNTER — Inpatient Hospital Stay: Payer: BLUE CROSS/BLUE SHIELD | Attending: Hematology and Oncology

## 2018-11-04 VITALS — BP 116/79 | HR 82 | Temp 97.7°F | Resp 16 | Wt 210.9 lb

## 2018-11-04 DIAGNOSIS — Z933 Colostomy status: Secondary | ICD-10-CM | POA: Insufficient documentation

## 2018-11-04 DIAGNOSIS — Z9221 Personal history of antineoplastic chemotherapy: Secondary | ICD-10-CM | POA: Insufficient documentation

## 2018-11-04 DIAGNOSIS — Z86010 Personal history of colonic polyps: Secondary | ICD-10-CM | POA: Diagnosis not present

## 2018-11-04 DIAGNOSIS — C20 Malignant neoplasm of rectum: Secondary | ICD-10-CM

## 2018-11-04 DIAGNOSIS — R32 Unspecified urinary incontinence: Secondary | ICD-10-CM | POA: Insufficient documentation

## 2018-11-04 DIAGNOSIS — Z90721 Acquired absence of ovaries, unilateral: Secondary | ICD-10-CM | POA: Diagnosis not present

## 2018-11-04 DIAGNOSIS — Z923 Personal history of irradiation: Secondary | ICD-10-CM | POA: Diagnosis not present

## 2018-11-04 DIAGNOSIS — Z90710 Acquired absence of both cervix and uterus: Secondary | ICD-10-CM | POA: Insufficient documentation

## 2018-11-04 DIAGNOSIS — K435 Parastomal hernia without obstruction or  gangrene: Secondary | ICD-10-CM | POA: Insufficient documentation

## 2018-11-04 DIAGNOSIS — Z9049 Acquired absence of other specified parts of digestive tract: Secondary | ICD-10-CM | POA: Insufficient documentation

## 2018-11-04 LAB — CBC WITH DIFFERENTIAL/PLATELET
Abs Immature Granulocytes: 0.06 10*3/uL (ref 0.00–0.07)
Basophils Absolute: 0.1 10*3/uL (ref 0.0–0.1)
Basophils Relative: 1 %
Eosinophils Absolute: 0.1 10*3/uL (ref 0.0–0.5)
Eosinophils Relative: 1 %
HCT: 39.3 % (ref 36.0–46.0)
Hemoglobin: 13.4 g/dL (ref 12.0–15.0)
Immature Granulocytes: 1 %
Lymphocytes Relative: 36 %
Lymphs Abs: 2.2 10*3/uL (ref 0.7–4.0)
MCH: 30.2 pg (ref 26.0–34.0)
MCHC: 34.1 g/dL (ref 30.0–36.0)
MCV: 88.5 fL (ref 80.0–100.0)
Monocytes Absolute: 0.3 10*3/uL (ref 0.1–1.0)
Monocytes Relative: 6 %
Neutro Abs: 3.4 10*3/uL (ref 1.7–7.7)
Neutrophils Relative %: 55 %
Platelets: 210 10*3/uL (ref 150–400)
RBC: 4.44 MIL/uL (ref 3.87–5.11)
RDW: 13.4 % (ref 11.5–15.5)
WBC: 6.2 10*3/uL (ref 4.0–10.5)
nRBC: 0 % (ref 0.0–0.2)

## 2018-11-04 LAB — COMPREHENSIVE METABOLIC PANEL
ALT: 29 U/L (ref 0–44)
AST: 20 U/L (ref 15–41)
Albumin: 4 g/dL (ref 3.5–5.0)
Alkaline Phosphatase: 78 U/L (ref 38–126)
Anion gap: 11 (ref 5–15)
BUN: 18 mg/dL (ref 6–20)
CO2: 21 mmol/L — ABNORMAL LOW (ref 22–32)
Calcium: 9.1 mg/dL (ref 8.9–10.3)
Chloride: 101 mmol/L (ref 98–111)
Creatinine, Ser: 0.75 mg/dL (ref 0.44–1.00)
GFR calc Af Amer: >60 mL/min (ref >60)
GFR calc non Af Amer: >60 mL/min (ref >60)
Glucose, Bld: 104 mg/dL — ABNORMAL HIGH (ref 70–99)
Potassium: 4.1 mmol/L (ref 3.5–5.1)
Sodium: 133 mmol/L — ABNORMAL LOW (ref 135–145)
Total Bilirubin: 0.6 mg/dL (ref 0.3–1.2)
Total Protein: 7.2 g/dL (ref 6.5–8.1)

## 2018-11-04 NOTE — Progress Notes (Signed)
Pt here for follow up.   Reports she is experiencing contraction like feelings around her stoma occasionally. States this has been over the past 6 years, off and on. Started taking Bentyl in 07/2018 that seems to help some. States she was informed it could be due to scar tissue and that she may need to talk to surgeon.

## 2018-11-04 NOTE — Telephone Encounter (Signed)
Spoke with [facility] to request Colonoscopy report. Requesting ROI to be sent. Information given to [facility] to contact patient to have her fill out paperwork and fax over to [facility].

## 2018-11-05 ENCOUNTER — Other Ambulatory Visit: Payer: Self-pay

## 2018-11-05 ENCOUNTER — Other Ambulatory Visit: Payer: Self-pay | Admitting: Internal Medicine

## 2018-11-05 DIAGNOSIS — G4710 Hypersomnia, unspecified: Secondary | ICD-10-CM

## 2018-11-05 LAB — CEA: CEA: 1.2 ng/mL (ref 0.0–4.7)

## 2018-11-05 NOTE — Telephone Encounter (Addendum)
Refilled: 07/30/2018 Last OV: 07/30/2018 Next OV: 08/05/2019

## 2018-11-06 MED ORDER — METHYLPHENIDATE HCL ER (CD) 30 MG PO CPCR: 30.0000 mg | ORAL_CAPSULE | Freq: Two times a day (BID) | ORAL | 0 refills | Status: DC

## 2018-11-19 ENCOUNTER — Telehealth: Payer: Self-pay

## 2018-11-19 NOTE — Telephone Encounter (Signed)
Contacted patient to inquire if ROI form has been filled out to obtain Colonoscopy results from [facility]. Patient reports she was informed she would receive it through Docu-Sign but never did. Pt states she could go to [place] location to fill out information next week. Dan Europe to make [facility] aware.

## 2018-11-26 ENCOUNTER — Other Ambulatory Visit: Payer: BLUE CROSS/BLUE SHIELD

## 2018-12-04 ENCOUNTER — Other Ambulatory Visit: Payer: Self-pay

## 2018-12-04 DIAGNOSIS — G4710 Hypersomnia, unspecified: Secondary | ICD-10-CM

## 2018-12-06 MED ORDER — METHYLPHENIDATE HCL ER (CD) 30 MG PO CPCR: 30.0000 mg | ORAL_CAPSULE | Freq: Two times a day (BID) | ORAL | 0 refills | Status: DC

## 2018-12-06 NOTE — Telephone Encounter (Signed)
Refilled: 11/06/2018 Last OV: 07/30/2018 Next OV: 08/05/2019  Pt would like a 3 month supply sent to pharmacy

## 2018-12-13 ENCOUNTER — Other Ambulatory Visit: Payer: Self-pay | Admitting: Internal Medicine

## 2018-12-14 DIAGNOSIS — Z933 Colostomy status: Secondary | ICD-10-CM | POA: Diagnosis not present

## 2018-12-27 ENCOUNTER — Telehealth: Payer: BLUE CROSS/BLUE SHIELD | Admitting: Family

## 2018-12-27 DIAGNOSIS — J302 Other seasonal allergic rhinitis: Secondary | ICD-10-CM | POA: Diagnosis not present

## 2018-12-27 MED ORDER — LEVOCETIRIZINE DIHYDROCHLORIDE 5 MG PO TABS: 5.0000 mg | ORAL_TABLET | Freq: Every evening | ORAL | 0 refills | Status: DC

## 2018-12-27 MED ORDER — FLUTICASONE PROPIONATE 50 MCG/ACT NA SUSP: 2.0000 | Freq: Every day | NASAL | 0 refills | Status: DC

## 2018-12-27 NOTE — Progress Notes (Signed)
E visit for Allergic Rhinitis We are sorry that you are not feeling well.  Here is how we plan to help!  Based on what you have shared with me it looks like you have Allergic Rhinitis.  Rhinitis is when a reaction occurs that causes nasal congestion, runny nose, sneezing, and itching.  Most types of rhinitis are caused by an inflammation and are associated with symptoms in the eyes ears or throat. There are several types of rhinitis.  The most common are acute rhinitis, which is usually caused by a viral illness, allergic or seasonal rhinitis, and nonallergic or year-round rhinitis.  Nasal allergies occur certain times of the year.  Allergic rhinitis is caused when allergens in the air trigger the release of histamine in the body.  Histamine causes itching, swelling, and fluid to build up in the fragile linings of the nasal passages, sinuses and eyelids.  An itchy nose and clear discharge are common.  I recommend the following over the counter treatments: Xyzal 5 mg take 1 tablet daily  I also would recommend a nasal spray: Flonase 2 sprays into each nostril once daily   HOME CARE:   You can use an over-the-counter saline nasal spray as needed  Avoid areas where there is heavy dust, mites, or molds  Stay indoors on windy days during the pollen season  Keep windows closed in home, at least in bedroom; use air conditioner.  Use high-efficiency house air filter  Keep windows closed in car, turn AC on re-circulate  Avoid playing out with dog during pollen season  GET HELP RIGHT AWAY IF:   If your symptoms do not improve within 10 days  You become short of breath  You develop yellow or green discharge from your nose for over 3 days  You have coughing fits  MAKE SURE YOU:   Understand these instructions  Will watch your condition  Will get help right away if you are not doing well or get worse  Thank you for choosing an e-visit. Your e-visit answers were reviewed by a  board certified advanced clinical practitioner to complete your personal care plan. Depending upon the condition, your plan could have included both over the counter or prescription medications. Please review your pharmacy choice. Be sure that the pharmacy you have chosen is open so that you can pick up your prescription now.  If there is a problem you may message your provider in MyChart to have the prescription routed to another pharmacy. Your safety is important to us. If you have drug allergies check your prescription carefully.  For the next 24 hours, you can use MyChart to ask questions about today's visit, request a non-urgent call back, or ask for a work or school excuse from your e-visit provider. You will get an email in the next two days asking about your experience. I hope that your e-visit has been valuable and will speed your recovery.        

## 2019-01-09 ENCOUNTER — Other Ambulatory Visit: Payer: Self-pay

## 2019-01-09 DIAGNOSIS — G4710 Hypersomnia, unspecified: Secondary | ICD-10-CM

## 2019-01-10 ENCOUNTER — Other Ambulatory Visit: Payer: Self-pay | Admitting: Internal Medicine

## 2019-01-10 ENCOUNTER — Telehealth: Payer: Self-pay | Admitting: Internal Medicine

## 2019-01-10 MED ORDER — METHYLPHENIDATE HCL ER (CD) 30 MG PO CPCR: 30.0000 mg | ORAL_CAPSULE | Freq: Two times a day (BID) | ORAL | 0 refills | Status: DC

## 2019-01-10 NOTE — Telephone Encounter (Signed)
Refilled: 12/06/2018 Last OV: 07/30/2018 Next OV: 08/05/2019

## 2019-01-20 ENCOUNTER — Other Ambulatory Visit: Payer: Self-pay | Admitting: Internal Medicine

## 2019-01-20 DIAGNOSIS — J309 Allergic rhinitis, unspecified: Secondary | ICD-10-CM

## 2019-01-28 ENCOUNTER — Other Ambulatory Visit: Payer: Self-pay

## 2019-01-28 ENCOUNTER — Ambulatory Visit (INDEPENDENT_AMBULATORY_CARE_PROVIDER_SITE_OTHER): Payer: BLUE CROSS/BLUE SHIELD | Admitting: Internal Medicine

## 2019-01-28 DIAGNOSIS — E871 Hypo-osmolality and hyponatremia: Secondary | ICD-10-CM

## 2019-01-28 DIAGNOSIS — R1084 Generalized abdominal pain: Secondary | ICD-10-CM

## 2019-01-28 DIAGNOSIS — Z933 Colostomy status: Secondary | ICD-10-CM | POA: Diagnosis not present

## 2019-01-28 DIAGNOSIS — G4710 Hypersomnia, unspecified: Secondary | ICD-10-CM | POA: Diagnosis not present

## 2019-01-28 DIAGNOSIS — E538 Deficiency of other specified B group vitamins: Secondary | ICD-10-CM | POA: Diagnosis not present

## 2019-01-28 DIAGNOSIS — Z7189 Other specified counseling: Secondary | ICD-10-CM

## 2019-01-28 DIAGNOSIS — F5112 Insufficient sleep syndrome: Secondary | ICD-10-CM

## 2019-01-28 MED ORDER — METHYLPHENIDATE HCL ER (CD) 30 MG PO CPCR: 30.0000 mg | ORAL_CAPSULE | Freq: Two times a day (BID) | ORAL | 0 refills | Status: DC

## 2019-01-28 MED ORDER — DICYCLOMINE HCL 10 MG PO CAPS: 10.0000 mg | ORAL_CAPSULE | Freq: Two times a day (BID) | ORAL | 11 refills | Status: DC

## 2019-01-28 NOTE — Patient Instructions (Addendum)
Good to see you!   have refilled your Metadate for May, June and July ( the scripts will be on file with [facility])  I have reduced the monthly refill amount of dicyclomine fo #60/month  REMEMBER THE MARSHMALLOW TRICK  PICKLE JUICE (1 ounce) as needed for muscle spasms   You can increase your claritin to twice daily , or add/substitute Allegra (fexofenadine  180 mg ) to prevent hives  Continue daily flonase and singulair as well    See you in 6 months

## 2019-01-28 NOTE — Progress Notes (Signed)
Virtual Visit via [email or web address]  This visit type was conducted due to national recommendations for restrictions regarding the COVID-19 pandemic (e.g. social distancing).  This format is felt to be most appropriate for this patient at this time.  All issues noted in this document were discussed and addressed.  No physical exam was performed (except for noted visual exam findings with Video Visits).   I connected with@ on 01/28/19 at 10:00 AM EDT by a video enabled telemedicine application  and verified that I am speaking with the correct person using two identifiers. Location patient: home Location provider: work Persons participating in the virtual visit: patient, provider  I discussed the limitations, risks, security and privacy concerns of performing an evaluation and management service by telephone and the availability of in person appointments. I also discussed with the patient that there may be a patient responsible charge related to this service. The patient expressed understanding and agreed to proceed.  Reason for visit: refill on meds for ADD last seein Nov   HPI:   33 yr female with history of colon cancer , obesity and narcolepsy here for 6 month follow up .  Primary hypersomnolence disorder  : her symptoms have been  managed with high but stable dose of methylphenidate 30 mg bid for over a year.  She is functioning well in spite of the altered routine imposed by the COVID 19 epidemic   She was Treated for sinusitis in jan and allergic rhinitis in March .  Discussed ways to effectively manage the pollen   History of colon CA:  Her last Colonoscopy feb 2019 [place]: normal.  annual CEA normal feb 2020    IBS:  Using Bentyl for management of abd cramping. Not usoing more than twice daily   Low sodium   Noted in Feb 2020  at 133 . Having some muscle cramps at night in feet and legs.   no   ROS: Patient denies headache, fevers, malaise, unintentional weight loss, skin rash, eye pain,  sinus congestion and sinus pain, sore throat, dysphagia,  hemoptysis , cough, dyspnea, wheezing, chest pain, palpitations, orthopnea, edema, abdominal pain, nausea, melena, diarrhea, constipation, flank pain, dysuria, hematuria, urinary  Frequency, nocturia, numbness, tingling, seizures,  Focal weakness, Loss of consciousness,  Tremor, insomnia, depression, anxiety, and suicidal ideation.      Past Medical History:  Diagnosis Date  . Allergy   . Asthma   . Gallstones   . Hypersomnolence disorder 2005   managed with metidate  . Rectal cancer ([place]) 05/13/2012   Resection with colostomy and hysterectomy.  . Sleep apnea 2005   resolved with ENT surgery,  [name]    Past Surgical History:  Procedure Laterality Date  . ABDOMINAL HYSTERECTOMY    . ARTHROSCOPIC REPAIR ACL  2006   right knee Dr Donna Christen at [facility]  . CHOLECYSTECTOMY N/A 07/21/2016   Procedure: LAPAROSCOPIC CHOLECYSTECTOMY CONVERTED TO OPEN;  Surgeon: Olean Ree, MD;  Location: [facility] ORS;  Service: General;  Laterality: N/A;  . COLON SURGERY    . COLONOSCOPY WITH PROPOFOL N/A 12/14/2015   Procedure: COLONOSCOPY WITH PROPOFOL;  Surgeon: Manya Silvas, MD;  Location: [facility] ENDOSCOPY;  Service: Endoscopy;  Laterality: N/A;  . Colostomy Placement    . PORTA CATH REMOVAL N/A 11/06/2016   Procedure: Glori Luis Cath Removal;  Surgeon: Algernon Huxley, MD;  Location: [place] [facility];  Service: Cardiovascular;  Laterality: N/A;  . TONSILLECTOMY AND ADENOIDECTOMY      Family History  Problem Relation  Age of Onset  . Prostate cancer Father   . Heart attack Maternal Grandfather   . Hyperlipidemia Paternal Grandfather   . Hypertension Paternal Grandfather   . 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  albuterol (PROVENTIL HFA;VENTOLIN HFA) 108 (90 Base) MCG/ACT inhaler, Inhale 2 puffs into the lungs every 6 (six) hours as needed for  wheezing or shortness of breath., Disp: 1 Inhaler, Rfl: 2 .  dicyclomine (BENTYL) 10 MG capsule, Take 1 capsule (10 mg total) by mouth 2 (two) times daily., Disp: 60 capsule, Rfl: 11 .  fluticasone (FLONASE) 50 MCG/ACT nasal spray, Place 2 sprays into both nostrils daily., Disp: 16 g, Rfl: 0 .  loratadine (CLARITIN) 10 MG tablet, Take 10 mg by mouth daily., Disp: , Rfl:  .  [START ON 02/09/2019] methylphenidate (METADATE CD) 30 MG CR capsule, Take 1 capsule (30 mg total) by mouth 2 (two) times daily. In the morning and in the afternoon, Disp: 60 capsule, Rfl: 0 .  montelukast (SINGULAIR) 10 MG tablet, TAKE 1 TABLET BY MOUTH EVERYDAY AT BEDTIME, Disp: 90 tablet, Rfl: 1 .  PREMARIN 0.9 MG tablet, TAKE 1 TABLET (0.9 MG TOTAL) BY MOUTH DAILY., Disp: 90 tablet, Rfl: 1 .  [START ON 04/10/2019] methylphenidate (METADATE CD) 30 MG CR capsule, Take 1 capsule (30 mg total) by mouth 2 (two) times daily. In the morning and in the afternoon, Disp: 60 capsule, Rfl: 0 .  [START ON 03/11/2019] methylphenidate (METADATE CD) 30 MG CR capsule, Take 1 capsule (30 mg total) by mouth 2 (two) times daily. In the morning and in the afternoon, Disp: 60 capsule, Rfl: 0  EXAM:  VITALS per patient if applicable:  GENERAL: alert, oriented, appears well and in no acute distress  HEENT: atraumatic, conjunttiva clear, no obvious abnormalities on inspection of external nose and ears  NECK: normal movements of the head and neck  LUNGS: on inspection no signs of respiratory distress, breathing rate appears normal, no obvious gross SOB, gasping or wheezing  CV: no obvious cyanosis  MS: moves all visible extremities without noticeable abnormality  PSYCH/NEURO: pleasant and cooperative, no obvious depression or anxiety, speech and thought processing grossly intact  ASSESSMENT AND PLAN:  Discussed the following assessment and plan:  Hypersomnia - Plan: methylphenidate (METADATE CD) 30 MG CR capsule  Hyponatremia  B12  deficiency  S/P colostomy (HCC)  Generalized abdominal cramping  Primary hypersomnolence disorder  Educated About Covid-19 Virus Infection  Hyponatremia Noted in Feb 2020  With nocturnal muscle cramps.  Likely due to GI losses.  rec an ounce of pickle juice nightly   B12 deficiency Secondary to colectomy.  Continue IM injections   S/P colostomy ([place]) Secondary to colon ca .    Generalized abdominal cramping Chronic, improved with  Bentyl. Using it bid   Primary hypersomnolence disorder Secondary to narcolepsy. Previously managed by Dr. Tamala Julian at the [facility] with Metadate.   Refills given   Educated About Covid-19 Virus Infection Educated patient on the signs and symptoms of COVID-19 infection and ways to avoid the viral infection including washing hands frequently with soap and water,  using hand sanitizer if unable to wash, avoiding touching face,  staying at home and limiting visitors,  and avoiding contact with people coming in and out of home.  Reminded patient to call office with questions/concerns.  The importance of social  distancing was discussed today    I discussed the assessment and treatment plan with the patient. The patient was provided an opportunity to ask questions and all were answered. The patient agreed with the plan and demonstrated an understanding of the instructions.   The patient was advised to call back or seek an in-person evaluation if the symptoms worsen or if the condition fails to improve as anticipated.  I provided 25 minutes of non-face-to-face time during this encounter.   [name], MD

## 2019-01-30 ENCOUNTER — Encounter: Payer: Self-pay | Admitting: Internal Medicine

## 2019-01-30 DIAGNOSIS — E871 Hypo-osmolality and hyponatremia: Secondary | ICD-10-CM | POA: Insufficient documentation

## 2019-01-30 DIAGNOSIS — Z7189 Other specified counseling: Secondary | ICD-10-CM | POA: Insufficient documentation

## 2019-01-30 DIAGNOSIS — Z933 Colostomy status: Secondary | ICD-10-CM | POA: Insufficient documentation

## 2019-01-30 NOTE — Assessment & Plan Note (Signed)
Educated patient on the signs and symptoms of COVID-19 infection and ways to avoid the viral infection including washing hands frequently with soap and water,  using hand sanitizer if unable to wash, avoiding touching face,  staying at home and limiting visitors,  and avoiding contact with people coming in and out of home.  Reminded patient to call office with questions/concerns.  The importance of social distancing was discussed today 

## 2019-01-30 NOTE — Assessment & Plan Note (Addendum)
Noted in Feb 2020  With nocturnal muscle cramps.  Likely due to GI losses.  rec an ounce of pickle juice nightly

## 2019-01-30 NOTE — Assessment & Plan Note (Signed)
Secondary to colectomy.  Continue IM injections

## 2019-01-30 NOTE — Assessment & Plan Note (Signed)
Chronic, improved with  Bentyl. Using it bid

## 2019-01-30 NOTE — Assessment & Plan Note (Signed)
Secondary to colon ca .

## 2019-01-30 NOTE — Assessment & Plan Note (Signed)
Secondary to narcolepsy. Previously managed by Dr. Tamala Julian at the [facility] with Metadate.   Refills given

## 2019-02-01 ENCOUNTER — Other Ambulatory Visit: Payer: Self-pay | Admitting: Family

## 2019-02-01 DIAGNOSIS — Z933 Colostomy status: Secondary | ICD-10-CM

## 2019-02-08 DIAGNOSIS — C20 Malignant neoplasm of rectum: Secondary | ICD-10-CM | POA: Diagnosis not present

## 2019-02-08 DIAGNOSIS — Z933 Colostomy status: Secondary | ICD-10-CM | POA: Diagnosis not present

## 2019-02-09 NOTE — Telephone Encounter (Signed)
Patient is s/p colostomy and is out of ostomy supplies and the ostomy supply company says she needs an rX.  Please call the number she listed in her mychart message and find out what they need besides the generic DME order

## 2019-02-11 DIAGNOSIS — Z933 Colostomy status: Secondary | ICD-10-CM | POA: Diagnosis not present

## 2019-02-14 NOTE — Telephone Encounter (Signed)
Spoke with R[place]ael Cowan and they stated that they have sent out some supplies to [place] already and there is some other supplies that is being sent to [place] warehouse to be shipped out in several days to [place] Cowan. They will also fax over [place] written order to our office in [place] next 3 to 4 business days. I called R[place]ael Cowan to let her know what [facility] had said and [place] Cowan stated that she did already receive some of [place] supplies. Cowan gave a verbal understanding.

## 2019-04-13 ENCOUNTER — Other Ambulatory Visit: Payer: Self-pay

## 2019-04-13 DIAGNOSIS — G4710 Hypersomnia, unspecified: Secondary | ICD-10-CM

## 2019-04-13 MED ORDER — METHYLPHENIDATE HCL ER (CD) 30 MG PO CPCR: 30.0000 mg | ORAL_CAPSULE | Freq: Two times a day (BID) | ORAL | 0 refills | Status: DC

## 2019-04-13 NOTE — Telephone Encounter (Signed)
Last OV  01/28/19 patient was given refills for May , June and July last fill was 04/10/19?

## 2019-04-14 DIAGNOSIS — Z933 Colostomy status: Secondary | ICD-10-CM | POA: Diagnosis not present

## 2019-05-02 ENCOUNTER — Telehealth: Payer: BLUE CROSS/BLUE SHIELD | Admitting: Physician Assistant

## 2019-05-02 ENCOUNTER — Encounter: Payer: Self-pay | Admitting: Physician Assistant

## 2019-05-02 DIAGNOSIS — B001 Herpesviral vesicular dermatitis: Secondary | ICD-10-CM | POA: Diagnosis not present

## 2019-05-02 MED ORDER — VALACYCLOVIR HCL 1 G PO TABS: 2000.0000 mg | ORAL_TABLET | Freq: Two times a day (BID) | ORAL | 0 refills | Status: DC

## 2019-05-02 NOTE — Progress Notes (Signed)
We are sorry that you are not feeling well.  Here is how we plan to help!  Based on what you have shared with me it does look like you have a viral infection.    Most cold sores or fever blisters are small fluid filled blisters around the mouth caused by herpes simplex virus.  The most common strain of the virus causing cold sores is herpes simplex virus 1.  It can be spread by skin contact, sharing eating utensils, or even sharing towels.  Cold sores are contagious to other people until dry. (Approximately 5-7 days).  Wash your hands. You can spread the virus to your eyes through handling your contact lenses after touching the lesions.  Most people experience pain at the sight or tingling sensations in their lips that may begin before the ulcers erupt.  Herpes simplex is treatable but not curable.  It may lie dormant for a long time and then reappear due to stress or prolonged sun exposure.  Many patients have success in treating their cold sores with an over the counter topical called Abreva.  You may apply the cream up to 5 times daily (maximum 10 days) until healing occurs.  If you would like to use an oral antiviral medication to speed the healing of your cold sore, I have sent a prescription to your local pharmacy Valacyclovir 2 gm twice daily for 1 day    HOME CARE:   Wash your hands frequently.  Do not pick at or rub the sore.  Don't open the blisters.  Avoid kissing other people during this time.  Avoid sharing drinking glasses, eating utensils, or razors.  Do not handle contact lenses unless you have thoroughly washed your hands with soap and warm water!  Avoid oral sex during this time.  Herpes from sores on your mouth can spread to your partner's genital area.  Avoid contact with anyone who has eczema or a weakened immune system.  Cold sores are often triggered by exposure to intense sunlight, use a lip balm containing a sunscreen (SPF 30 or higher).  GET HELP RIGHT AWAY  IF:   Blisters look infected.  Blisters occur near or in the eye.  Symptoms last longer than 10 days.  Your symptoms become worse.  MAKE SURE YOU:   Understand these instructions.  Will watch your condition.  Will get help right away if you are not doing well or get worse.    Your e-visit answers were reviewed by a board certified advanced clinical practitioner to complete your personal care plan.  Depending upon the condition, your plan could have  Included both over the counter or prescription medications.    Please review your pharmacy choice.  Be sure that the pharmacy you have chosen is open so that you can pick up your prescription now.  If there is a problem you can message your provider in [place] to have the prescription routed to another pharmacy.    Your safety is important to [place].  If you have drug allergies check our prescription carefully.  For the next 24 hours you can use MyChart to ask questions about today's visit, request a non-urgent call back, or ask for a work or school excuse from your e-visit provider.  You will get an email in the next two days asking about your experience.  I hope that your e-visit has been valuable and will speed your recovery. I spent 5-10 minutes on review and completion of this note- Lacy Duverney  PAC

## 2019-05-31 ENCOUNTER — Other Ambulatory Visit: Payer: Self-pay

## 2019-05-31 DIAGNOSIS — G4710 Hypersomnia, unspecified: Secondary | ICD-10-CM

## 2019-06-01 ENCOUNTER — Telehealth: Payer: Self-pay

## 2019-06-01 DIAGNOSIS — Z933 Colostomy status: Secondary | ICD-10-CM | POA: Diagnosis not present

## 2019-06-01 NOTE — Telephone Encounter (Signed)
Sent mychart message

## 2019-06-08 ENCOUNTER — Other Ambulatory Visit: Payer: Self-pay | Admitting: Internal Medicine

## 2019-06-09 ENCOUNTER — Other Ambulatory Visit: Payer: Self-pay | Admitting: Internal Medicine

## 2019-06-09 NOTE — Telephone Encounter (Signed)
Pharmacy stated that they do not have methylphenidate (METADATE CD) 30 MG CR capsule in stock. Stated pt is requesting rx be sent to  [id number] - [place], [place] [phone] (Phone) [phone] (Fax)

## 2019-06-09 NOTE — Telephone Encounter (Signed)
Requested medication (s) are due for refill today: yes  Requested medication (s) are on the active medication list: yes  Last refill:  04/2019  Future visit scheduled: yes  Notes to clinic: This refill cannot be delegated   Requested Prescriptions  Pending Prescriptions Disp Refills   methylphenidate (METADATE CD) 30 MG CR capsule 60 capsule 0    Sig: Take 1 capsule (30 mg total) by mouth 2 (two) times daily. In the morning and in the afternoon     Not Delegated - Psychiatry:  Stimulants/ADHD Failed - 06/09/2019 11:08 AM      Failed - This refill cannot be delegated      Failed - Urine Drug Screen completed in last 360 days.      Failed - Valid encounter within last 3 months    Recent Outpatient Visits          4 months ago Hypersomnia   [place], [place]   10 months ago Encounter for general adult medical examination with abnormal findings   [place] [name], MD   1 year ago B12 deficiency   [facility] [place] [name], MD   2 years ago Pure hypercholesterolemia   [place] [name], MD   2 years ago Hepatic steatosis   [facility] [place] [name], MD      Future Appointments            In 1 month Derrel Nip, Aris Everts, MD [facility], [facility]

## 2019-06-10 MED ORDER — METHYLPHENIDATE HCL ER (CD) 30 MG PO CPCR: 30.0000 mg | ORAL_CAPSULE | Freq: Two times a day (BID) | ORAL | 0 refills | Status: DC

## 2019-06-10 NOTE — Telephone Encounter (Signed)
Last Refill:  05/13/19  Last OV:  02/01/19

## 2019-07-08 DIAGNOSIS — Z20828 Contact with and (suspected) exposure to other viral communicable diseases: Secondary | ICD-10-CM | POA: Diagnosis not present

## 2019-07-08 DIAGNOSIS — R0981 Nasal congestion: Secondary | ICD-10-CM | POA: Diagnosis not present

## 2019-07-14 ENCOUNTER — Other Ambulatory Visit: Payer: Self-pay

## 2019-07-15 ENCOUNTER — Other Ambulatory Visit: Payer: Self-pay

## 2019-07-15 ENCOUNTER — Inpatient Hospital Stay: Payer: BC Managed Care – PPO | Attending: Nurse Practitioner | Admitting: Nurse Practitioner

## 2019-07-15 ENCOUNTER — Encounter: Payer: Self-pay | Admitting: Nurse Practitioner

## 2019-07-15 VITALS — BP 120/81 | HR 75 | Temp 97.1°F | Resp 18 | Wt 219.0 lb

## 2019-07-15 DIAGNOSIS — Z85048 Personal history of other malignant neoplasm of rectum, rectosigmoid junction, and anus: Secondary | ICD-10-CM | POA: Insufficient documentation

## 2019-07-15 DIAGNOSIS — Z9221 Personal history of antineoplastic chemotherapy: Secondary | ICD-10-CM | POA: Insufficient documentation

## 2019-07-15 DIAGNOSIS — Z933 Colostomy status: Secondary | ICD-10-CM | POA: Diagnosis not present

## 2019-07-15 DIAGNOSIS — Z923 Personal history of irradiation: Secondary | ICD-10-CM | POA: Insufficient documentation

## 2019-07-15 DIAGNOSIS — Z90710 Acquired absence of both cervix and uterus: Secondary | ICD-10-CM | POA: Diagnosis not present

## 2019-07-15 DIAGNOSIS — G8929 Other chronic pain: Secondary | ICD-10-CM | POA: Insufficient documentation

## 2019-07-15 DIAGNOSIS — Z5181 Encounter for therapeutic drug level monitoring: Secondary | ICD-10-CM | POA: Diagnosis not present

## 2019-07-15 DIAGNOSIS — Z79890 Hormone replacement therapy (postmenopausal): Secondary | ICD-10-CM

## 2019-07-15 DIAGNOSIS — R109 Unspecified abdominal pain: Secondary | ICD-10-CM | POA: Insufficient documentation

## 2019-07-15 MED ORDER — METHYLPHENIDATE HCL ER (CD) 30 MG PO CPCR: 30.0000 mg | ORAL_CAPSULE | Freq: Two times a day (BID) | ORAL | 0 refills | Status: DC

## 2019-07-15 NOTE — Progress Notes (Signed)
Patient here for follow up with Ander Purpura NP. No concerns voiced today.

## 2019-07-15 NOTE — Progress Notes (Signed)
Gynecologic Oncology Inteval Visit   Referring Provider: Dr. Melissa C Corcoran (Oncology)  Chief Concern: ERT and gynecologic issues  Subjective:  Rachael Cowan is a 33 y.o. female initially seen in consultation from Dr. Corcoran, for ERT and chronic gynecologic issues, who returns to clinic today for annual follow-up.    She says she has been doing well over the past years.  She continues to take Premarin orally and has been tolerating well.  Denies vasomotor symptoms.  She has history of prior hysterectomy and does not need Pap smears.  History of cystocele.  Continues to have some chronic urinary urge incontinence.  Says symptoms are bothersome and she is not sought evaluation for symptoms.  Not sexually active.  Denies any bleeding, discharge, pain.  Denies weight loss, night sweats.  Endorses weight gain. Continues to have chronic abdominal pain secondary to her colostomy which has been present since time of her surgery and is exacerbated by fasting then large meal.  It was previously recommended that she discuss with her surgeon.  She takes Bentyl for this with some improvement but then has subsequent constipation. She was last seen by her PCP, Dr. Tullo virtually in May 2020.  In November 2019 abnormal nodularity noted on breast exam with Dr. Tullo per review of her note. I see that mammogram was ordered but not yet performed.  She was seen by Dr. Corcoran in February 2020 for continued surveillance of stage IIIb rectal cancer.  CEA was normal at that time.    Oncology History Rachael Cowan is a pleasant female who is seen in consultation from Dr. Corcoran for ERT and gynecologic issues.   Rachael Cowan has a history of stage IIIB rectal cancer s/p concurrent chemotherapy and radiation followed by definitive surgery as per note below.   She originally presented with rectal bleeding.  Colonoscopy on 12/31/2011 revealed a significant stricture of the distal rectum. Malignant appearing  ulcerated tumor was found. There was scattered area extending from about 10 cm the anal area involving anus itself.  Tumor was T4N1M0 (based on endoscopy [facility] ultrasound).  MRI scan of pelvis revealed invasion of the vaginal wall.  PET scan on 01/14/2012 revealed PET positive area within the rectum and distal sigmoid colon.   She was treated with 6 weeks of continuous infusion (Monday-Friday) 5-fluorouracil (5-FU) with radiation.  Radiation and chemotherapy completed on 03/07/2012.  She underwent rectosigmoidectomy, hysterectomy and oophorectomy on 05/13/2012 at [facility].  Dr. Valea performed her surgery at [facility]. Pathologic stage was [id number]. MSI performed, limited sample, MSS.  She began post-operative FOLFOX chemotherapy in 05/2012.  Oxaliplatin was held in 06/2012 due to elevated liver enzymes and restarted in 07/2012.  She finished FOLFOX chemotherapy in 12/2012 (11cycles). Because of neutropenia, her last treatment was discontinued.  Abdomen and pelvic CT on 10/16/2015 noted the resection for distal rectal adenocarcinoma. There was stable appearance of the lower anatomic pelvis with no new or progressive soft tissue to suggest local recurrence.  There was parastomal hernia containing an increased amount of pericolonic fat compared to the previous study.  Colonoscopy performed by Dr. Robert Elliott on 12/14/2015 revealed one diminutive polyp in the sigmoid colon (removed).  Pathology revealed a tubular adenoma with no evidence of dysplasia or malignancy.  Nodules at the top of the colostomy revealed inflamed and ulcerated colocutaneous tissue.  She has no family history of colon cancer.  My Risk genetic testing was negative per patient report.  She has been followed by Dr. Corcoran   for her oncology issues. She was seen by Dr. Havrilesky for her postop visit in 2013 and given a prescription for oral estrogen for menopausal symptoms as well as vaginal estrogen and a dilator for vaginal stenosis. She  has not been using the vaginal estrogen or dilator.   She uses premarin 0.09 mg daily dose and has good control of her hot flashes.     Problem List: Patient Active Problem List   Diagnosis Date Noted  . Hyponatremia 01/30/2019  . S/P colostomy (HCC) 01/30/2019  . Educated about COVID-19 virus infection 01/30/2019  . Abnormal breast exam 08/01/2018  . Encounter for general adult medical examination with abnormal findings 06/17/2017  . S/P cholecystectomy 09/10/2016  . Incisional hernia, without obstruction or gangrene   . History of hematuria 07/19/2016  . Hepatic steatosis 07/19/2016  . B12 deficiency 06/23/2013  . Obesity 06/23/2013  . Other and unspecified hyperlipidemia 06/23/2013  . Generalized abdominal cramping 06/01/2012  . Menopause 05/13/2012  . Airway hyperreactivity 05/07/2012  . Hypersomnia 05/07/2012  . History of rectal cancer 01/13/2012  . Primary hypersomnolence disorder 11/12/2011    Past Medical History: Past Medical History:  Diagnosis Date  . Allergy   . Asthma   . Gallstones   . Hypersomnolence disorder 2005   managed with metidate  . Rectal cancer (HCC) 05/13/2012   Resection with colostomy and hysterectomy.  . Sleep apnea 2005   resolved with ENT surgery,  [facility]    Past Surgical History: Past Surgical History:  Procedure Laterality Date  . ABDOMINAL HYSTERECTOMY    . ARTHROSCOPIC REPAIR ACL  2006   right knee Dr Garrett at [facility]  . CHOLECYSTECTOMY N/A 07/21/2016   Procedure: LAPAROSCOPIC CHOLECYSTECTOMY CONVERTED TO OPEN;  Surgeon: Jose Piscoya, MD;  Location: [facility] ORS;  Service: General;  Laterality: N/A;  . COLON SURGERY    . COLONOSCOPY WITH PROPOFOL N/A 12/14/2015   Procedure: COLONOSCOPY WITH PROPOFOL;  Surgeon: Robert T Elliott, MD;  Location: [facility] ENDOSCOPY;  Service: Endoscopy;  Laterality: N/A;  . Colostomy Placement    . PORTA CATH REMOVAL N/A 11/06/2016   Procedure: Porta Cath Removal;  Surgeon: Jason S Dew, MD;  Location:  [facility] INVASIVE CV LAB;  Service: Cardiovascular;  Laterality: N/A;  . TONSILLECTOMY AND ADENOIDECTOMY      Past Gynecologic History: see HPI Menarche age 15 y.o.  Status post hysterectomy   OB History: G0P0 OB History  No obstetric history on file.    Family History: Family History  Problem Relation Age of Onset  . Prostate cancer Father   . Heart attack Maternal Grandfather   . Hyperlipidemia Paternal Grandfather   . Hypertension Paternal Grandfather   . Skin cancer Maternal Grandmother     Social History: Social History   Socioeconomic History  . Marital status: Single    Spouse name: Not on file  . Number of children: Not on file  . Years of education: Not on file  . Highest education level: Not on file  Occupational History  . Occupation: artist  Social Needs  . Financial resource strain: Not on file  . Food insecurity    Worry: Not on file    Inability: Not on file  . Transportation needs    Medical: Not on file    Non-medical: Not on file  Tobacco Use  . Smoking status: Never Smoker  . Smokeless tobacco: Never Used  Substance and Sexual Activity  . Alcohol use: Yes    Comment: ocassional  .   Drug use: No  . Sexual activity: Never  Lifestyle  . Physical activity    Days per week: Not on file    Minutes per session: Not on file  . Stress: Not on file  Relationships  . Social connections    Talks on phone: Not on file    Gets together: Not on file    Attends religious service: Not on file    Active member of club or organization: Not on file    Attends meetings of clubs or organizations: Not on file    Relationship status: Not on file  . Intimate partner violence    Fear of current or ex partner: Not on file    Emotionally abused: Not on file    Physically abused: Not on file    Forced sexual activity: Not on file  Other Topics Concern  . 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      She is not sexually active because she has a permanent colostomy     Allergies: No Known Allergies  Current Medications: Current Outpatient Medications  Medication Sig Dispense Refill  . albuterol (PROVENTIL HFA;VENTOLIN HFA) 108 (90 Base) MCG/ACT inhaler Inhale 2 puffs into the lungs every 6 (six) hours as needed for wheezing or shortness of breath. 1 Inhaler 2  . dicyclomine (BENTYL) 10 MG capsule Take 1 capsule (10 mg total) by mouth 2 (two) times daily. 60 capsule 11  . fluticasone (FLONASE) 50 MCG/ACT nasal spray Place 2 sprays into both nostrils daily. 16 g 0  . loratadine (CLARITIN) 10 MG tablet Take 10 mg by mouth daily.    . methylphenidate (METADATE CD) 30 MG CR capsule Take 1 capsule (30 mg total) by mouth 2 (two) times daily. In the morning and in the afternoon 60 capsule 0  . montelukast (SINGULAIR) 10 MG tablet TAKE 1 TABLET BY MOUTH EVERYDAY AT BEDTIME 90 tablet 1  . PREMARIN 0.9 MG tablet TAKE 1 TABLET (0.9 MG TOTAL) BY MOUTH DAILY. 90 tablet 1  . valACYclovir (VALTREX) 1000 MG tablet Take 2 tablets (2,000 mg total) by mouth 2 (two) times daily. 4 tablet 0   No current facility-administered medications for this visit.     Review of Systems General:  no complaints Skin: no complaints Eyes: no complaints HEENT: no complaints Breasts: no complaints Pulmonary: no complaints Cardiac: no complaints Gastrointestinal: Occasional pain with bowel movements through colostomy Genitourinary/Sexual: Occasionally urge incontinence.  Not sexually active Ob/Gyn: no complaint Musculoskeletal: no complaints Hematology: no complaints Neurologic/Psych: no complaints   Objective:  Physical Examination:  Today's Vitals   07/15/19 1308  BP: 120/81  Pulse: 75  Resp: 18  Temp: (!) 97.1 F (36.2 C)  Weight: 219 lb (99.3 kg)  PainSc: 0-No pain   Body mass index is 40.06 kg/m. LMP 10/25/2011    ECOG Performance  Status: 0 - Asymptomatic  GENERAL: Patient is a well appearing female in no acute distress HEENT:  Sclera clear. Anicteric NODES:  Negative axillary, supraclavicular, inguinal lymph node survery LUNGS:  Clear to auscultation bilaterally.   BREAST: Bilateral breasts are symmetrical, non-tender, no suspicious masses, skin, nipple, or lymphadenopathy changes noted. HEART:  Regular rate and rhythm.  ABDOMEN:  Soft, nontender.  No hernias, incisions well healed. No masses or ascites. Ostomy intact EXTREMITIES:  No peripheral edema. Atraumatic. No cyanosis   SKIN:  Clear with no obvious rashes or skin changes.  NEURO:  Nonfocal. Well oriented.  Appropriate affect.  PELVIC: Exam chaperoned by nurse.  Vulva: Normal-appearing vulva without masses, tenderness, or lesions. Vagina: Deviated posteriorly, length and width preserved.  Vaginal cuff well-healed. Cystocele present.  Bimanual: Adnexa: No masses. Ovaries, cervix, Uterus,, rectum Surgically absent    Assessment:  Rachael Cowan is a 33 y.o. female diagnosed with h/o stage IIIB rectal cancer s/p concurrent chemotherapy and radiation followed by definitive surgery XL rectosigmoidectomy with APR, hysterectomy and oophorectomy on 05/13/2012.  Clinically no evidence of disease.  Menopause managed by estrogen replacement therapy.    Urinary incontinence with cystocele.  Symptoms not bothersome  Breast tissue changes-normal physical breast exam today without tenderness, skin changes, or nodularity  Medical co-morbidities complicating care: chemotherapy and radiation. Body mass index is 40.06 kg/m.  Plan:   Problem List Items Addressed This Visit      Other   Encounter for monitoring estrogen replacement therapy following surgical menopause - Primary     Continue estrogen replacement therapy with Premarin 0.9 mg tablet daily.  Recently refilled by Dr. Derrel Nip.  I reached out to [place] who agrees to manage ERT and menopausal symptoms in the future.  She can contact us if she has concerns or questions.  Normal breast exam today.  However, history of abnormality in nodularity at 12:00 of left breast.  In setting of Premarin/estrogen replacement therapy, increased risk of breast cancer which we discussed today.  Discussed with Dr. Derrel Nip who ordered mammogram in November 2019 but had not yet been performed.  She recommends that patient proceed with mammogram and subsequent ultrasound.  I contacted [place] to facilitate scheduling of this and they will contact patient with date and time.  Patient to follow-up with PCP for results and further management.  Cystocele-mild symptoms.  Could consider referral to pelvic floor rehab versus GYN for vaginal pessary versus surgery.  Patient declines referral today and can consider in the future if bothersome.  Disposition: Return to clinic as needed but no new appointments today.   A total of 15 minutes were spent with the patient/family today; >50% was spent in education, counseling and coordination of care for endometrial cancer.  Thank you for allowing me to participate in the care of your very pleasant patient  Rachael Rutter, DNP, [facility] [phone] (clinic)  CC: Dr. Lequita Asal & Dr. Derrel Nip

## 2019-08-02 DIAGNOSIS — Z933 Colostomy status: Secondary | ICD-10-CM | POA: Diagnosis not present

## 2019-08-05 ENCOUNTER — Encounter: Payer: BLUE CROSS/BLUE SHIELD | Admitting: Internal Medicine

## 2019-08-08 ENCOUNTER — Ambulatory Visit (INDEPENDENT_AMBULATORY_CARE_PROVIDER_SITE_OTHER): Payer: BC Managed Care – PPO | Admitting: Internal Medicine

## 2019-08-08 ENCOUNTER — Encounter: Payer: Self-pay | Admitting: Internal Medicine

## 2019-08-08 ENCOUNTER — Other Ambulatory Visit: Payer: Self-pay

## 2019-08-08 VITALS — BP 116/82 | HR 89 | Temp 97.8°F | Ht 62.0 in | Wt 215.8 lb

## 2019-08-08 DIAGNOSIS — J4520 Mild intermittent asthma, uncomplicated: Secondary | ICD-10-CM

## 2019-08-08 DIAGNOSIS — Z1231 Encounter for screening mammogram for malignant neoplasm of breast: Secondary | ICD-10-CM

## 2019-08-08 DIAGNOSIS — Z23 Encounter for immunization: Secondary | ICD-10-CM

## 2019-08-08 DIAGNOSIS — E538 Deficiency of other specified B group vitamins: Secondary | ICD-10-CM

## 2019-08-08 DIAGNOSIS — K760 Fatty (change of) liver, not elsewhere classified: Secondary | ICD-10-CM | POA: Diagnosis not present

## 2019-08-08 DIAGNOSIS — F5112 Insufficient sleep syndrome: Secondary | ICD-10-CM | POA: Diagnosis not present

## 2019-08-08 DIAGNOSIS — Z0000 Encounter for general adult medical examination without abnormal findings: Secondary | ICD-10-CM | POA: Diagnosis not present

## 2019-08-08 DIAGNOSIS — Z6835 Body mass index (BMI) 35.0-35.9, adult: Secondary | ICD-10-CM

## 2019-08-08 NOTE — Patient Instructions (Signed)
Your baseline  mammogram has been ordered.  You are encouraged (required) to call to make your appointment at [facility]  Check with your therapist about her experience in counselling about binge eating.  If she is not experienced In this area,  I will reach out to Dr Nicolasa Ducking   I recommend a trial of wellbutrin  Or contrave to help.  If you decide you want to try it ,  Let me know.  Bupropion; Naltrexone extended-release tablets What is this medicine? BUPROPION; NALTREXONE (byoo PROE pee on; nal TREX one) is a combination product used to promote and maintain weight loss in obese adults or overweight adults who also have weight related medical problems. This medicine should be used with a reduced calorie diet and increased physical activity. This medicine may be used for other purposes; ask your health care provider or pharmacist if you have questions. COMMON BRAND NAME(S): Contrave What should I tell my health care provider before I take this medicine? They need to know if you have any of these conditions:  an eating disorder, such as anorexia or bulimia  bipolar disorder  diabetes  depression  drug abuse or addiction  glaucoma  head injury  heart disease  high blood pressure  history of a tumor or infection of your brain or spine  history of stroke  history of irregular heartbeat  if you often drink alcohol  kidney disease  liver disease  schizophrenia  seizures  suicidal thoughts, plans, or attempt; a previous suicide attempt by you or a family member  an unusual or allergic reaction to bupropion, naltrexone, other medicines, foods, dyes, or preservatives  breast-feeding  pregnant or trying to become pregnant How should I use this medicine? Take this medicine by mouth with a glass of water. Follow the directions on the prescription label. Take this medicine in the morning and in the evenings as directed by your healthcare professional. Dennis Bast can take  it with or without food. Do not take with high-fat meals as this may increase your risk of seizures. Do not crush, chew, or cut these tablets. Do not take your medicine more often than directed. Do not stop taking this medicine suddenly except upon the advice of your doctor. A special MedGuide will be given to you by the pharmacist with each prescription and refill. Be sure to read this information carefully each time. Talk to your pediatrician regarding the use of this medicine in children. Special care may be needed. Overdosage: If you think you have taken too much of this medicine contact a poison control center or emergency room at once. NOTE: This medicine is only for you. Do not share this medicine with others. What if I miss a dose? If you miss a dose, skip the missed dose and take your next tablet at the regular time. Do not take double or extra doses. What may interact with this medicine? Do not take this medicine with any of the following medications:  any prescription or street opioid drug like codeine, heroin, methadone  linezolid  MAOIs like Carbex, Eldepryl, Marplan, Nardil, and Parnate  methylene blue (injected into a vein)  other medicines that contain bupropion like Zyban or Wellbutrin This medicine may also interact with the following medications:  alcohol  certain medicines for anxiety or sleep  certain medicines for blood pressure like metoprolol, propranolol  certain medicines for depression or psychotic disturbances  certain medicines for HIV or AIDS like efavirenz, lopinavir, nelfinavir, ritonavir  certain  medicines for irregular heart beat like propafenone, flecainide  certain medicines for Parkinson's disease like amantadine, levodopa  certain medicines for seizures like carbamazepine, phenytoin,  phenobarbital  cimetidine  clopidogrel  cyclophosphamide  digoxin  disulfiram  furazolidone  isoniazid  nicotine  orphenadrine  procarbazine  steroid medicines like prednisone or cortisone  stimulant medicines for attention disorders, weight loss, or to stay awake  tamoxifen  theophylline  thioridazine  thiotepa  ticlopidine  tramadol  warfarin This list may not describe all possible interactions. Give your health care provider a list of all the medicines, herbs, non-prescription drugs, or dietary supplements you use. Also tell them if you smoke, drink alcohol, or use illegal drugs. Some items may interact with your medicine. What should I watch for while using this medicine? This medicine is intended to be used in addition to a healthy diet and appropriate exercise. The best results are achieved this way. Do not increase or in any way change your dose without consulting your doctor or healthcare provider. Do not take this medicine with other prescription or over-the-counter weight loss products without consulting your doctor or healthcare provider. Your doctor should tell you to stop taking this medicine if you do not lose a certain amount of weight within the first 12 weeks of treatment. Visit your doctor or healthcare provider for regular checkups. Your doctor may order blood tests or other tests to see how you are doing. This medicine may cause serious skin reactions. They can happen weeks to months after starting the medicine. Contact your healthcare provider right away if you notice fevers or flu-like symptoms with a rash. The rash may be red or purple and then turn into blisters or peeling of the skin. Or, you might notice a red rash with swelling of the face, lips or lymph nodes in your neck or under your arms. This medicine may affect blood sugar levels. If you have diabetes, check with your doctor or health care professional before you change your diet or the  dose of your diabetic medicine. Patients and their families should watch out for new or worsening depression or thoughts of suicide. Also watch out for sudden changes in feelings such as feeling anxious, agitated, panicky, irritable, hostile, aggressive, impulsive, severely restless, overly excited and hyperactive, or not being able to sleep. If this happens, especially at the beginning of treatment or after a change in dose, call your healthcare provider. Avoid alcoholic drinks while taking this medicine. Drinking large amounts of alcoholic beverages, using sleeping or anxiety medicines, or quickly stopping the use of these agents while taking this medicine may increase your risk for a seizure. What side effects may I notice from receiving this medicine? Side effects that you should report to your doctor or health care professional as soon as possible:  allergic reactions like skin rash, itching or hives, swelling of the face, lips, or tongue  breathing problems  changes in vision  confusion  elevated mood, decreased need for sleep, racing thoughts, impulsive behavior  fast or irregular heartbeat  hallucinations, loss of contact with reality  increased blood pressure  rash, fever, and swollen lymph nodes  redness, blistering, peeling, or loosening of the skin, including inside the mouth  seizures  signs and symptoms of liver injury like dark yellow or brown urine; general ill feeling or flu-like symptoms; light-colored stools; loss of appetite; nausea; right upper belly pain; unusually weak or tired; yellowing of the eyes or skin  suicidal thoughts or other mood changes  vomiting Side effects that usually do not require medical attention (report to your doctor or health care professional if they continue or are bothersome):  constipation  headache  loss of appetite  indigestion, stomach upset  tremors This list may not describe all possible side effects. Call your doctor  for medical advice about side effects. You may report side effects to FDA at 1-800-FDA-1088. Where should I keep my medicine? Keep out of the reach of children. Store at room temperature between 15 and 30 degrees C (59 and 86 degrees F). Throw away any unused medicine after the expiration date. NOTE: This sheet is a summary. It may not cover all possible information. If you have questions about this medicine, talk to your doctor, pharmacist, or health care provider.  2020 Elsevier/Gold Standard (2018-12-09 14:09:01)

## 2019-08-08 NOTE — Progress Notes (Signed)
Patient ID: Rachael Cowan, female    DOB: 10-27-1985  Age: 33 y.o. MRN: [id number]  The patient is here for annual PREVENTIVE examination and management of other chronic and acute problems.  Annual colonoscopy; last one   March 2019 history of rectal cancer  S/p colostomy with stoma .  Sees [facility]  annually  S/p TAH/BSO surgical menopause.  On HRT .  Has breast exam annually by [facility]    The risk factors are reflected in the social history.  The roster of all physicians providing medical care to patient - is listed in the Snapshot section of the chart.  Activities of daily living:  The patient is 100% independent in all ADLs: dressing, toileting, feeding as well as independent mobility  Home safety : The patient has smoke detectors in the home. They wear seatbelts.  There are no firearms at home. There is no violence in the home.   There is no risks for hepatitis, STDs or HIV. There is no   history of blood transfusion. They have no travel history to infectious disease endemic areas of the world.  The patient has seen their dentist in the last six month. They have seen their eye doctor in the last year. They admit to slight hearing difficulty with regard to whispered voices and some television programs.  They have deferred audiologic testing in the last year.  They do not  have excessive sun exposure. Discussed the need for sun protection: hats, long sleeves and use of sunscreen if there is significant sun exposure.   Diet: the importance of a healthy diet is discussed. They do have a healthy diet.  The benefits of regular aerobic exercise were discussed. She is not exercising regularly and would like to be referred to a therapist who specializes in eating disorders .  Depression screen: there are no signs or vegative symptoms of untreated depression except for overeating - denies irritability, anhedonia, sadness/tearfullness. .  The following portions of the patient's history were  reviewed and updated as appropriate: allergies, current medications, past family history, past medical history,  past surgical history, past social history  and problem list.  Visual acuity was not assessed per patient preference since she has regular follow up with her ophthalmologist. Hearing and body mass index were assessed and reviewed.   During the course of the visit the patient was educated and counseled about appropriate screening and preventive services including : fall prevention , diabetes screening, nutrition counseling, colorectal cancer screening, and recommended immunizations.    CC: The primary encounter diagnosis was Encounter for screening mammogram for malignant neoplasm of breast. Diagnoses of Need for immunization against influenza, Class 2 severe obesity due to excess calories with serious comorbidity and body mass index (BMI) of 35.0 to 35.9 in adult [facility]), Hepatic steatosis, Mild intermittent asthma without complication, Primary hypersomnolence disorder, B12 deficiency, and Encounter for preventive health examination were also pertinent to this visit.   Constant abdominal  pain around her stoma ,  Improves with 10 mg dose of bentyl, comes in waves.  Has not seen her colorectal surgeon but plans to contact. No weight loss or blood noted in stoma output     Weight gain due to overeating.  Discussed psychotherapy for binge eating'  Never had the diagnostic mammogram ordered last year.    Lab Results  Component Value Date   VITAMINB12 1,131 (H) 07/29/2017     History Rachael Cowan has a past medical history of Allergy, Asthma,  Gallstones, Hypersomnolence disorder (2005), Rectal cancer ([place]) (05/13/2012), and Sleep apnea (2005).   She has a past surgical history that includes Arthroscopic repair ACL (2006); Abdominal hysterectomy; Colon surgery; Colonoscopy with propofol (N/A, 12/14/2015); Colostomy Placement; Tonsillectomy and adenoidectomy; PORTA CATH REMOVAL (N/A, 11/06/2016); and  Cholecystectomy (N/A, 07/21/2016).   Her family history includes Heart attack in her maternal grandfather; Hyperlipidemia in her paternal grandfather; Hypertension in her paternal grandfather; Prostate cancer in her father; Skin cancer in her maternal grandmother.She reports that she has never smoked. She has never used smokeless tobacco. She reports current alcohol use. She reports that she does not use drugs.  Outpatient Medications Prior to Visit  Medication Sig Dispense Refill  . albuterol (PROVENTIL HFA;VENTOLIN HFA) 108 (90 Base) MCG/ACT inhaler Inhale 2 puffs into the lungs every 6 (six) hours as needed for wheezing or shortness of breath. 1 Inhaler 2  . dicyclomine (BENTYL) 10 MG capsule Take 1 capsule (10 mg total) by mouth 2 (two) times daily. 60 capsule 11  . fluticasone (FLONASE) 50 MCG/ACT nasal spray Place 2 sprays into both nostrils daily. 16 g 0  . loratadine (CLARITIN) 10 MG tablet Take 10 mg by mouth daily.    . methylphenidate (METADATE CD) 30 MG CR capsule Take 1 capsule (30 mg total) by mouth 2 (two) times daily. In the morning and in the afternoon 60 capsule 0  . [START ON 08/14/2019] methylphenidate (METADATE CD) 30 MG CR capsule Take 1 capsule (30 mg total) by mouth 2 (two) times daily. In the morning and in the afternoon 60 capsule 0  . montelukast (SINGULAIR) 10 MG tablet TAKE 1 TABLET BY MOUTH EVERYDAY AT BEDTIME 90 tablet 1  . PREMARIN 0.9 MG tablet TAKE 1 TABLET (0.9 MG TOTAL) BY MOUTH DAILY. 90 tablet 1  . valACYclovir (VALTREX) 1000 MG tablet Take 2 tablets (2,000 mg total) by mouth 2 (two) times daily. 4 tablet 0  . vitamin B-12 (CYANOCOBALAMIN) 1000 MCG tablet      No facility-administered medications prior to visit.     Review of Systems   Patient denies headache, fevers, malaise, unintentional weight loss, skin rash, eye pain, sinus congestion and sinus pain, sore throat, dysphagia,  hemoptysis , cough, dyspnea, wheezing, chest pain, palpitations, orthopnea,  edema,  nausea, melena, diarrhea, constipation, flank pain, dysuria, hematuria, urinary  Frequency, nocturia, numbness, tingling, seizures,  Focal weakness, Loss of consciousness,  Tremor, insomnia,  anxiety, and suicidal ideation.      Objective:  BP 116/82   Pulse 89   Temp 97.8 F (36.6 C) (Skin)   Ht 5\' 2"  (1.575 m)   Wt 215 lb 12.8 oz (97.9 kg)   LMP 10/25/2011   SpO2 97%   BMI 39.47 kg/m   Physical Exam   General appearance: alert, cooperative and appears stated age Ears: normal TM's and external ear canals both ears Throat: lips, mucosa, and tongue normal; teeth and gums normal Neck: no adenopathy, no carotid bruit, supple, symmetrical, trachea midline and thyroid not enlarged, symmetric, no tenderness/mass/nodules Back: symmetric, no curvature. ROM normal. No CVA tenderness. Lungs: clear to auscultation bilaterally Heart: regular rate and rhythm, S1, S2 normal, no murmur, click, rub or gallop Abdomen: soft, non-tender; bowel sounds normal; no masses,  no organomegaly Pulses: 2+ and symmetric Skin: Skin color, texture, turgor normal. No rashes or lesions Lymph nodes: Cervical, supraclavicular, and axillary nodes normal.   Assessment & Plan:   Problem List Items Addressed This Visit      Unprioritized  Primary hypersomnolence disorder    Secondary to narcolepsy. Previously managed by Dr. Tamala Julian at the [facility] with Metadate, now managed by me. Refills given for 6 month intervals       B12 deficiency    Secondary to colectomy.  Continue IM injections       Obesity    I have addressed  BMI and recommended a low glycemic index diet utilizing smaller more frequent meals to increase metabolism.  I have also recommended that patient start exercising with a goal of 30 minutes of aerobic exercise a minimum of 5 days per week. She is actively trying and recently employed portion size reduction and Weight Watchers program . She  Has inquired  about psychotherapy Dr. Nicolasa Ducking has recommended Nira Conn as an excellent therapist for patients with eating disorders..  She is located in [place] and her contact information is below  [facility] [phone], ext [phone]       Relevant Orders   TSH   Hemoglobin A1c   Lipid panel   Hepatic steatosis    Presumed by ultrasound changes and negative serologies to rule out autoimmune causes of hepatitis.  Current liver enzymes are due,  And were normal in Feb 2020.  and all modifiable risk factors including obesity, screening for diabetes and hyperlipidemia have been addressed   Lab Results  Component Value Date   ALT 29 11/04/2018   AST 20 11/04/2018   ALKPHOS 78 11/04/2018   BILITOT 0.6 11/04/2018         Relevant Orders   Comprehensive metabolic panel   Encounter for preventive health examination    At her last CPE a diagnostic mammogram was ordered of the right breast . Patient was scheduled twice by office but states that she was unaware of both appointments.  Recent breast exam was normal by gyn onc.  Will order screening mammogram       Asthma    No recent use of albuterol.        Other Visit Diagnoses    Encounter for screening mammogram for malignant neoplasm of breast    -  Primary   Relevant Orders   3d mammogram [facility]   Need for immunization against influenza       Relevant Orders   Flu Vaccine QUAD 36+ mos IM (Completed)      I am having Rachael Cowan maintain her loratadine, albuterol, fluticasone, montelukast, dicyclomine, valACYclovir, Premarin, methylphenidate, vitamin B-12, and methylphenidate.  No orders of the defined types were placed in this encounter.   There are no discontinued medications.  Follow-up: No follow-ups on file.   [name], MD

## 2019-08-08 NOTE — Progress Notes (Signed)
Pre visit review using our clinic review tool, if applicable. No additional management support is needed unless otherwise documented below in the visit note. 

## 2019-08-09 NOTE — Assessment & Plan Note (Signed)
Presumed by ultrasound changes and negative serologies to rule out autoimmune causes of hepatitis.  Current liver enzymes are due,  And were normal in Feb 2020.  and all modifiable risk factors including obesity, screening for diabetes and hyperlipidemia have been addressed   Lab Results  Component Value Date   ALT 29 11/04/2018   AST 20 11/04/2018   ALKPHOS 78 11/04/2018   BILITOT 0.6 11/04/2018

## 2019-08-09 NOTE — Assessment & Plan Note (Signed)
I have addressed  BMI and recommended a low glycemic index diet utilizing smaller more frequent meals to increase metabolism.  I have also recommended that patient start exercising with a goal of 30 minutes of aerobic exercise a minimum of 5 days per week. She is actively trying and recently employed portion size reduction and Weight Watchers program . She  Has inquired about psychotherapy Dr. Nicolasa Ducking has recommended Nira Conn as an excellent therapist for patients with eating disorders..  She is located in [place] and her contact information is below  [facility] [phone], ext [phone]

## 2019-08-09 NOTE — Assessment & Plan Note (Signed)
At her last CPE a diagnostic mammogram was ordered of the right breast . Patient was scheduled twice by office but states that she was unaware of both appointments.  Recent breast exam was normal by gyn onc.  Will order screening mammogram

## 2019-08-09 NOTE — Assessment & Plan Note (Signed)
No recent use of albuterol  

## 2019-08-09 NOTE — Assessment & Plan Note (Signed)
Secondary to narcolepsy. Previously managed by Dr. Tamala Julian at the [facility] with Metadate, now managed by me. Refills given for 6 month intervals

## 2019-08-09 NOTE — Assessment & Plan Note (Signed)
Secondary to colectomy.  Continue IM injections  

## 2019-08-10 ENCOUNTER — Encounter: Payer: Self-pay | Admitting: Internal Medicine

## 2019-09-02 ENCOUNTER — Other Ambulatory Visit: Payer: Self-pay

## 2019-09-02 DIAGNOSIS — Z20822 Contact with and (suspected) exposure to covid-19: Secondary | ICD-10-CM

## 2019-09-05 LAB — NOVEL CORONAVIRUS, NAA: SARS-CoV-2, NAA: NOT DETECTED

## 2019-09-13 DIAGNOSIS — Z933 Colostomy status: Secondary | ICD-10-CM | POA: Diagnosis not present

## 2019-09-20 ENCOUNTER — Other Ambulatory Visit: Payer: Self-pay

## 2019-09-20 MED ORDER — METHYLPHENIDATE HCL ER (CD) 30 MG PO CPCR: 30.0000 mg | ORAL_CAPSULE | Freq: Two times a day (BID) | ORAL | 0 refills | Status: DC

## 2019-09-20 NOTE — Telephone Encounter (Signed)
Refilled: 08/14/2019 Last OV: 08/08/2019 Next OV: not scheduled

## 2019-10-31 ENCOUNTER — Other Ambulatory Visit: Payer: Self-pay

## 2019-11-05 MED ORDER — METHYLPHENIDATE HCL ER (CD) 30 MG PO CPCR: 30.0000 mg | ORAL_CAPSULE | Freq: Two times a day (BID) | ORAL | 0 refills | Status: DC

## 2019-11-07 ENCOUNTER — Other Ambulatory Visit: Payer: Self-pay

## 2019-11-07 NOTE — Progress Notes (Signed)
Confirmed name and DOB. Denies any concerns.  

## 2019-11-07 NOTE — Progress Notes (Signed)
[facility]  [address], [address] [place], [place] [address] Phone: [phone]  Fax: [phone]   Clinic Day:  11/08/2019  Referring physician: [name], MD  Chief Complaint: Rachael Cowan is a 34 y.o. female with a history of stage IIIB rectal cancer who is seen for a 1 year assessment.   HPI: The patient was last seen in the medical oncology clinic on 11/04/2018. At that time, she noted peri-stomal "contractions".  Exam was unremarkable.  Labs were normal.  She was seen by Beckey Rutter, NP on 07/15/2019. She was doing well. She denied vasomotor symptoms. She had chronic urinary urge incontinence. Her symptoms were bothersome, but had not sought evaluation for symptoms. She had chronic abdominal pain secondary to her colostomy which had been present since time of her surgery and was exacerbated by fasting then large meal. She took Bentyl with some improvement but then had subsequent constipation. The patient continued estrogen replacement therapy with Premarin 0.9 mg tablet daily.  During the interim, she has done well. She notes abdominal pain secondary to scar tissue. She will get in touch with her surgeon at [facility] to resolve this issue. She denies any urinary symptoms. She remains on Bentyl. She notes cramping or the feeling of contractions during bowel movements.   She notes that she is due for a colonoscopy this year. Her last colonoscopy was on 11/18/2017.    Past Medical History:  Diagnosis Date  . Allergy   . Asthma   . Gallstones   . Hypersomnolence disorder 2005   managed with metidate  . Rectal cancer ([place]) 05/13/2012   Resection with colostomy and hysterectomy.  . Sleep apnea 2005   resolved with ENT surgery,  [name]    Past Surgical History:  Procedure Laterality Date  . ABDOMINAL HYSTERECTOMY    . ARTHROSCOPIC REPAIR ACL  2006   right knee Dr Donna Christen at [facility]  . CHOLECYSTECTOMY N/A 07/21/2016   Procedure: LAPAROSCOPIC  CHOLECYSTECTOMY CONVERTED TO OPEN;  Surgeon: Olean Ree, MD;  Location: [facility] ORS;  Service: General;  Laterality: N/A;  . COLON SURGERY    . COLONOSCOPY WITH PROPOFOL N/A 12/14/2015   Procedure: COLONOSCOPY WITH PROPOFOL;  Surgeon: Manya Silvas, MD;  Location: [facility] ENDOSCOPY;  Service: Endoscopy;  Laterality: N/A;  . Colostomy Placement    . PORTA CATH REMOVAL N/A 11/06/2016   Procedure: Glori Luis Cath Removal;  Surgeon: Algernon Huxley, MD;  Location: [facility];  Service: Cardiovascular;  Laterality: N/A;  . TONSILLECTOMY AND ADENOIDECTOMY      Family History  Problem Relation Age of Onset  . Prostate cancer Father   . Heart attack Maternal Grandfather   . Hyperlipidemia Paternal Grandfather   . Hypertension Paternal Grandfather   . Skin cancer Maternal Grandmother     Social History:  reports that she has never smoked. She has never used smokeless tobacco. She reports current alcohol use. She reports that she does not use drugs. She lives in [place], [place], but plans on moving back to [place] where her mother and rest of her family lives.  She graduated in 08/2015.  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  She has 2 siblings.  She moved from [place] to [place]. She is currently in grad school. The patient is alone today.  Allergies: No Known Allergies  Current Medications: Current Outpatient Medications  Medication Sig Dispense Refill  . dicyclomine (BENTYL) 10 MG capsule Take 1 capsule (  10 mg total) by mouth 2 (two) times daily. 60 capsule 11  . methylphenidate (METADATE CD) 30 MG CR capsule Take 1 capsule (30 mg total) by mouth 2 (two) times daily. In the morning and in the afternoon 60 capsule 0  . montelukast (SINGULAIR) 10 MG tablet TAKE 1 TABLET BY MOUTH EVERYDAY AT BEDTIME 90 tablet 1  . Omega-3 Fatty Acids (FISH OIL) 1000 MG CAPS Take 1 capsule by mouth daily.     Marland Kitchen PREMARIN 0.9 MG tablet TAKE 1 TABLET (0.9 MG TOTAL) BY MOUTH DAILY. 90 tablet  1  . vitamin B-12 (CYANOCOBALAMIN) 1000 MCG tablet     . albuterol (PROVENTIL HFA;VENTOLIN HFA) 108 (90 Base) MCG/ACT inhaler Inhale 2 puffs into the lungs every 6 (six) hours as needed for wheezing or shortness of breath. (Patient not taking: Reported on 11/07/2019) 1 Inhaler 2  . fluticasone (FLONASE) 50 MCG/ACT nasal spray Place 2 sprays into both nostrils daily. (Patient not taking: Reported on 11/07/2019) 16 g 0  . loratadine (CLARITIN) 10 MG tablet Take 10 mg by mouth daily.     No current facility-administered medications for this visit.    Review of Systems  Constitutional: Negative for chills, diaphoresis, fever, malaise/fatigue and weight loss (stable).       Feels good.  HENT: Negative for congestion, hearing loss, nosebleeds, sinus pain and sore throat.   Eyes: Negative.  Negative for blurred vision and double vision.  Respiratory: Negative.  Negative for cough, hemoptysis, sputum production and shortness of breath.   Cardiovascular: Negative.  Negative for chest pain, palpitations and leg swelling.  Gastrointestinal: Positive for abdominal pain (secondary to scar tissue). Negative for blood in stool, constipation, diarrhea, heartburn, melena, nausea and vomiting.       Peri-stomal contractions. Cramping with bowel movements.  Genitourinary: Negative for dysuria, frequency, hematuria and urgency.       Urinary incontinence.  Musculoskeletal: Negative.  Negative for back pain, joint pain, myalgias and neck pain.  Skin: Negative for itching and rash.  Neurological: Negative.  Negative for dizziness, tingling, sensory change, focal weakness, weakness and headaches.  Endo/Heme/Allergies: Negative.  Does not bruise/bleed easily.  Psychiatric/Behavioral: Negative.  Negative for depression and memory loss. The patient is not nervous/anxious and does not have insomnia.   All other systems reviewed and are negative.   Performance status (ECOG): 1  Vitals Blood pressure (!) 111/58, pulse  (!) 57, temperature (!) 96.6 F (35.9 C), temperature source Tympanic, resp. rate 18, height 5\' 2"  (1.575 m), weight 218 lb 7.6 oz (99.1 kg), last menstrual period 10/25/2011, SpO2 99 %.   Physical Exam  Constitutional: She is oriented to person, place, and time. She appears well-developed and well-nourished. No distress.  HENT:  Head: Normocephalic and atraumatic.  Mouth/Throat: Oropharynx is clear and moist. No oropharyngeal exudate.  Shoulder length brown hair.  Eyes: Pupils are equal, round, and reactive to light. Conjunctivae and EOM are normal. No scleral icterus.  Blue eyes.  Cardiovascular: Normal rate, regular rhythm and normal heart sounds.  No murmur heard. Pulmonary/Chest: Effort normal and breath sounds normal. No respiratory distress. She has no wheezes. She has no rales. She exhibits no tenderness.  Abdominal: Soft. Bowel sounds are normal. She exhibits no distension and no mass. There is no abdominal tenderness. There is no rebound and no guarding.  Colostomy.  Musculoskeletal:        General: No tenderness or edema. Normal range of motion.     Cervical back: Normal range of  motion and neck supple.  Lymphadenopathy:       Head (right side): No preauricular, no posterior auricular and no occipital adenopathy present.       Head (left side): No preauricular, no posterior auricular and no occipital adenopathy present.    She has no cervical adenopathy.    She has no axillary adenopathy.       Right: No inguinal and no supraclavicular adenopathy present.       Left: No inguinal and no supraclavicular adenopathy present.  Neurological: She is alert and oriented to person, place, and time.  Skin: Skin is warm and dry. She is not diaphoretic.  Psychiatric: She has a normal mood and affect. Her behavior is normal. Judgment and thought content normal.  Nursing note and vitals reviewed.   Appointment on 11/08/2019  Component Date Value Ref Range Status  . WBC 11/08/2019 5.7   4.0 - 10.5 K/uL Final  . RBC 11/08/2019 4.21  3.87 - 5.11 MIL/uL Final  . Hemoglobin 11/08/2019 12.7  12.0 - 15.0 g/dL Final  . HCT 11/08/2019 37.0  36.0 - 46.0 % Final  . MCV 11/08/2019 87.9  80.0 - 100.0 fL Final  . MCH 11/08/2019 30.2  26.0 - 34.0 pg Final  . MCHC 11/08/2019 34.3  30.0 - 36.0 g/dL Final  . RDW 11/08/2019 13.0  11.5 - 15.5 % Final  . Platelets 11/08/2019 184  150 - 400 K/uL Final  . nRBC 11/08/2019 0.0  0.0 - 0.2 % Final  . Neutrophils Relative % 11/08/2019 51  % Final  . Neutro Abs 11/08/2019 2.9  1.7 - 7.7 K/uL Final  . Lymphocytes Relative 11/08/2019 39  % Final  . Lymphs Abs 11/08/2019 2.3  0.7 - 4.0 K/uL Final  . Monocytes Relative 11/08/2019 7  % Final  . Monocytes Absolute 11/08/2019 0.4  0.1 - 1.0 K/uL Final  . Eosinophils Relative 11/08/2019 1  % Final  . Eosinophils Absolute 11/08/2019 0.1  0.0 - 0.5 K/uL Final  . Basophils Relative 11/08/2019 1  % Final  . Basophils Absolute 11/08/2019 0.1  0.0 - 0.1 K/uL Final  . Immature Granulocytes 11/08/2019 1  % Final  . Abs Immature Granulocytes 11/08/2019 0.06  0.00 - 0.07 K/uL Final   Performed at [facility], [address] [place], [place] [address]    Assessment:  Rachael Cowan is a 34 y.o. female with a history of stage IIIB (T4N1M0) rectal cancer s/p neoadjuvant radiation and 5FU (completed on 03/07/2012) followed by rectosigmoidectomy, hysterectomy and oophorectomy on 05/13/2012 at [facility].  Pathologic stage was ypT2ypN0.  She began post-operative FOLFOX chemotherapy in 05/2012.  She received 11 cycles (completed in 12/2012) secondary to neutropenia.  Abdomen and pelvic CT on 10/16/2015 noted the resection for distal rectal adenocarcinoma. There was stable appearance of the lower anatomic pelvis with no new or progressive soft tissue to suggest local recurrence.  There was a parastomal hernia containing an increased amount of pericolonic fat compared to the previous study.  Abdomen and pelvic CT  on 10/15/2016 revealed no acute findings in the abdomen or pelvis.  There was no specific features identified to suggest recurrent rectal cancer or metastatic disease.  There was left lower quadrant colostomy with fat containing parastomal hernia.  There was evidence of mild chronic inflammation of the distal colon noted up to the level of the ostomy.  CEA has been followed:  1.0 on 12/19/2013, 0.8 on 05/08/2014, 1.1 on 10/12/2015, 0.9 on 04/25/2016, 1.1  on 10/23/2016, 1.4 on 10/23/2017, 1.2 on 11/04/2018, and 1.0 on 11/08/2019.   Colonoscopy on 12/14/2015 revealed one diminutive polyp in the sigmoid colon.  Pathology revealed a tubular adenoma with no evidence of dysplasia or malignancy.  Nodules at the top of the colostomy revealed inflamed and ulcerated colocutaneous tissue.  She has no family history of colon cancer.  My Risk genetic testing was negative per patient report.  Symptomatically, she feels good.  She describes chronic abdominal cramping felt secondary to scar tissue.  Exam is stable.  Plan: 1.   Labs today: CBC with diff, CMP, CEA. 2.   Stage IIIB rectal cancer             Clinically, she continues to do well.             CEA is 1.0 (normal).             Re-request colonoscopy report from [facility] in 2019.  Patient notes follow-up colonoscopy in 2021. 3.   RTC in 1 year for MD assessment and labs (CBC with differential, CMP, CEA).  I discussed the assessment and treatment plan with the patient.  The patient was provided an opportunity to ask questions and all were answered.  The patient agreed with the plan and demonstrated an understanding of the instructions.  The patient was advised to call back if the symptoms worsen or if the condition fails to improve as anticipated.   Lequita Asal, MD, PhD    11/08/2019, 10:04 AM  I, Selena Batten, am acting as scribe for [employer]. Mike Gip, MD, PhD.  I, Bland Rudzinski C. Mike Gip, MD, have reviewed the above documentation for  accuracy and completeness, and I agree with the above.

## 2019-11-08 ENCOUNTER — Inpatient Hospital Stay: Payer: 59 | Attending: Hematology and Oncology

## 2019-11-08 ENCOUNTER — Inpatient Hospital Stay (HOSPITAL_BASED_OUTPATIENT_CLINIC_OR_DEPARTMENT_OTHER): Payer: 59 | Admitting: Hematology and Oncology

## 2019-11-08 ENCOUNTER — Telehealth: Payer: Self-pay | Admitting: Internal Medicine

## 2019-11-08 ENCOUNTER — Other Ambulatory Visit: Payer: Self-pay | Admitting: Hematology and Oncology

## 2019-11-08 VITALS — BP 111/58 | HR 57 | Temp 96.6°F | Resp 18 | Ht 62.0 in | Wt 218.5 lb

## 2019-11-08 DIAGNOSIS — C20 Malignant neoplasm of rectum: Secondary | ICD-10-CM

## 2019-11-08 DIAGNOSIS — Z85048 Personal history of other malignant neoplasm of rectum, rectosigmoid junction, and anus: Secondary | ICD-10-CM

## 2019-11-08 DIAGNOSIS — K760 Fatty (change of) liver, not elsewhere classified: Secondary | ICD-10-CM

## 2019-11-08 DIAGNOSIS — Z6835 Body mass index (BMI) 35.0-35.9, adult: Secondary | ICD-10-CM

## 2019-11-08 DIAGNOSIS — E66812 Obesity, class 2: Secondary | ICD-10-CM

## 2019-11-08 LAB — COMPREHENSIVE METABOLIC PANEL
ALT: 30 U/L (ref 0–44)
AST: 23 U/L (ref 15–41)
Albumin: 3.5 g/dL (ref 3.5–5.0)
Alkaline Phosphatase: 59 U/L (ref 38–126)
Anion gap: 10 (ref 5–15)
BUN: 17 mg/dL (ref 6–20)
CO2: 20 mmol/L — ABNORMAL LOW (ref 22–32)
Calcium: 8.9 mg/dL (ref 8.9–10.3)
Chloride: 104 mmol/L (ref 98–111)
Creatinine, Ser: 0.67 mg/dL (ref 0.44–1.00)
GFR calc Af Amer: >60 mL/min (ref >60)
GFR calc non Af Amer: >60 mL/min (ref >60)
Glucose, Bld: 88 mg/dL (ref 70–99)
Potassium: 3.9 mmol/L (ref 3.5–5.1)
Sodium: 134 mmol/L — ABNORMAL LOW (ref 135–145)
Total Bilirubin: 0.5 mg/dL (ref 0.3–1.2)
Total Protein: 6.2 g/dL — ABNORMAL LOW (ref 6.5–8.1)

## 2019-11-08 LAB — CBC WITH DIFFERENTIAL/PLATELET
Abs Immature Granulocytes: 0.06 10*3/uL (ref 0.00–0.07)
Basophils Absolute: 0.1 10*3/uL (ref 0.0–0.1)
Basophils Relative: 1 %
Eosinophils Absolute: 0.1 10*3/uL (ref 0.0–0.5)
Eosinophils Relative: 1 %
HCT: 37 % (ref 36.0–46.0)
Hemoglobin: 12.7 g/dL (ref 12.0–15.0)
Immature Granulocytes: 1 %
Lymphocytes Relative: 39 %
Lymphs Abs: 2.3 10*3/uL (ref 0.7–4.0)
MCH: 30.2 pg (ref 26.0–34.0)
MCHC: 34.3 g/dL (ref 30.0–36.0)
MCV: 87.9 fL (ref 80.0–100.0)
Monocytes Absolute: 0.4 10*3/uL (ref 0.1–1.0)
Monocytes Relative: 7 %
Neutro Abs: 2.9 10*3/uL (ref 1.7–7.7)
Neutrophils Relative %: 51 %
Platelets: 184 10*3/uL (ref 150–400)
RBC: 4.21 MIL/uL (ref 3.87–5.11)
RDW: 13 % (ref 11.5–15.5)
WBC: 5.7 10*3/uL (ref 4.0–10.5)
nRBC: 0 % (ref 0.0–0.2)

## 2019-11-08 NOTE — Telephone Encounter (Signed)
Skylar from [facility] said that we need to change our lab orders for this patient back to future. They accidentally released our labs and she apologized.

## 2019-11-09 LAB — CEA: CEA: 1 ng/mL (ref 0.0–4.7)

## 2019-11-10 NOTE — Telephone Encounter (Signed)
Labs have been put back in as future.

## 2019-11-11 ENCOUNTER — Telehealth: Payer: Self-pay | Admitting: Internal Medicine

## 2019-11-11 NOTE — Telephone Encounter (Signed)
Refill was sent in on Feb 6 for one month,  It has not been filled.  Did she lose it?

## 2019-11-11 NOTE — Telephone Encounter (Signed)
Refilled: 11/05/2019 Last OV: 08/08/2019 Next OV: not scheduled

## 2019-11-11 NOTE — Telephone Encounter (Signed)
PT needs refill on methylphenidate (METADATE CD) 30 MG CR capsule. Pt states that the pharmacy needs it called in by PCP.

## 2019-11-15 ENCOUNTER — Other Ambulatory Visit: Payer: Self-pay | Admitting: Internal Medicine

## 2019-11-15 NOTE — Telephone Encounter (Signed)
Pt states that she has gotten new insurance and that it needs prior approval before it can be filled. Please advise. Pt only has a couple left.

## 2019-11-15 NOTE — Telephone Encounter (Signed)
PA has been submitted for a quantity exception. Pt is aware.

## 2019-11-16 NOTE — Telephone Encounter (Signed)
PA was denied. Insurance is only wanting to pay for 30 a month.

## 2019-11-16 NOTE — Telephone Encounter (Signed)
Let patient know. Has she always taken it twice daily?  It's the controlled release so that may be whey they are not covering the twice daily dosing.   Her choice is to reduce dose to 30 mg once daily or take a slightly higher dose once daily (40 mg)

## 2019-11-17 NOTE — Telephone Encounter (Signed)
She has always been on the twice daily since she was in college per pt. I put that information in the prior authorization.

## 2019-11-22 ENCOUNTER — Telehealth: Payer: Self-pay | Admitting: Internal Medicine

## 2019-11-22 NOTE — Telephone Encounter (Signed)
Rachael Cowan with [employer] called and states that Pt needs a prior auth on methylphenidate (METADATE CD) 30 MG CR capsule. You can call back at [phone] option #3 ref# [id number]

## 2019-11-22 NOTE — Telephone Encounter (Signed)
PA has been submitted several times and denied each time.

## 2019-11-23 ENCOUNTER — Telehealth: Payer: Self-pay

## 2019-11-23 MED ORDER — METHYLPHENIDATE HCL ER (CD) 60 MG PO CPCR: 60.0000 mg | ORAL_CAPSULE | Freq: Every day | ORAL | 0 refills | Status: DC

## 2019-11-23 NOTE — Telephone Encounter (Signed)
60 mg dose sent for 30 day trial to [facility]

## 2019-11-23 NOTE — Telephone Encounter (Signed)
Spoke with pt to let her know that the insurance company got back with [place] and they are wanting her to try either the modafinil or the methylphenidate CD 60 mg once daily. Pt stated that she would like to try the Methylphenidate CD 60 mg once daily.

## 2019-11-23 NOTE — Addendum Note (Signed)
Addended by: [name] on: 11/23/2019 03:16 PM   Modules accepted: Orders

## 2019-11-23 NOTE — Telephone Encounter (Signed)
Mychart message sent to let pt know that medication has been [facility].

## 2019-12-19 ENCOUNTER — Other Ambulatory Visit: Payer: Self-pay

## 2019-12-22 ENCOUNTER — Other Ambulatory Visit: Payer: Self-pay

## 2019-12-22 MED ORDER — METHYLPHENIDATE HCL ER (CD) 60 MG PO CPCR: 60.0000 mg | ORAL_CAPSULE | Freq: Every day | ORAL | 0 refills | Status: DC

## 2019-12-22 NOTE — Telephone Encounter (Signed)
Last written 11-23-19 #30 Last OV 08-08-19 No Future OV [facility]

## 2020-01-19 ENCOUNTER — Telehealth: Payer: 59 | Admitting: Physician Assistant

## 2020-01-19 DIAGNOSIS — B001 Herpesviral vesicular dermatitis: Secondary | ICD-10-CM | POA: Diagnosis not present

## 2020-01-19 MED ORDER — VALACYCLOVIR HCL 1 G PO TABS: 1000.0000 mg | ORAL_TABLET | Freq: Two times a day (BID) | ORAL | 0 refills | Status: DC

## 2020-01-19 NOTE — Progress Notes (Signed)
Hi Rachael Cowan,  I am sorry you are not feeling well.  Sounds like you need to visit your PCP to discuss therapy.   I will prescribe you antiviral medication for your current symptoms.  But due to your medical history, you may need to consider suppressive therapy. Please schedule an appointment to discuss this with your PCP.  Based on what you have shared with me it does look like you have a viral infection.    Most cold sores or fever blisters are small fluid filled blisters around the mouth caused by herpes simplex virus.  The most common strain of the virus causing cold sores is herpes simplex virus 1.  It can be spread by skin contact, sharing eating utensils, or even sharing towels.  Cold sores are contagious to other people until dry. (Approximately 5-7 days).  Wash your hands. You can spread the virus to your eyes through handling your contact lenses after touching the lesions.  Most people experience pain at the sight or tingling sensations in their lips that may begin before the ulcers erupt.  Herpes simplex is treatable but not curable.  It may lie dormant for a long time and then reappear due to stress or prolonged sun exposure.  Many patients have success in treating their cold sores with an over the counter topical called Abreva.  You may apply the cream up to 5 times daily (maximum 10 days) until healing occurs.  If you would like to use an oral antiviral medication to speed the healing of your cold sore, I have sent a prescription to your local pharmacy Valacyclovir 2 gm take one by mouth twice a day for 1 day    HOME CARE:   Wash your hands frequently.  Do not pick at or rub the sore.  Don't open the blisters.  Avoid kissing other people during this time.  Avoid sharing drinking glasses, eating utensils, or razors.  Do not handle contact lenses unless you have thoroughly washed your hands with soap and warm water!  Avoid oral sex during this time.  Herpes from sores on your  mouth can spread to your partner's genital area.  Avoid contact with anyone who has eczema or a weakened immune system.  Cold sores are often triggered by exposure to intense sunlight, use a lip balm containing a sunscreen (SPF 30 or higher).  GET HELP RIGHT AWAY IF:   Blisters look infected.  Blisters occur near or in the eye.  Symptoms last longer than 10 days.  Your symptoms become worse.  MAKE SURE YOU:   Understand these instructions.  Will watch your condition.  Will get help right away if you are not doing well or get worse.    Your e-visit answers were reviewed by a board certified advanced clinical practitioner to complete your personal care plan.  Depending upon the condition, your plan could have  Included both over the counter or prescription medications.    Please review your pharmacy choice.  Be sure that the pharmacy you have chosen is open so that you can pick up your prescription now.  If there is a problem you can message your provider in [facility] to have the prescription routed to another pharmacy.    Your safety is important to [place].  If you have drug allergies check our prescription carefully.  For the next 24 hours you can use MyChart to ask questions about today's visit, request a non-urgent call back, or ask for a work or school  excuse from your e-visit provider.  You will get an email in the next two days asking about your experience.  I hope that your e-visit has been valuable and will speed your recovery.  Greater than 5 minutes, yet less than 10 minutes of time have been spent researching, coordinating and implementing care for this patient today.

## 2020-01-26 ENCOUNTER — Telehealth: Payer: Self-pay | Admitting: Internal Medicine

## 2020-01-26 ENCOUNTER — Other Ambulatory Visit: Payer: Self-pay

## 2020-01-26 MED ORDER — METHYLPHENIDATE HCL ER (CD) 60 MG PO CPCR: 60.0000 mg | ORAL_CAPSULE | Freq: Every day | ORAL | 0 refills | Status: DC

## 2020-01-26 NOTE — Telephone Encounter (Signed)
Refill request for metadate, last seen 08-08-19, last filled 12-22-19.  Please advise.

## 2020-01-26 NOTE — Telephone Encounter (Signed)
MyChart message sent  Refill

## 2020-01-30 ENCOUNTER — Other Ambulatory Visit: Payer: Self-pay

## 2020-02-01 ENCOUNTER — Encounter: Payer: Self-pay | Admitting: Internal Medicine

## 2020-02-01 ENCOUNTER — Ambulatory Visit: Payer: 59 | Admitting: Internal Medicine

## 2020-02-01 ENCOUNTER — Other Ambulatory Visit: Payer: Self-pay

## 2020-02-01 VITALS — BP 104/80 | HR 79 | Temp 97.4°F | Resp 14 | Ht 62.0 in | Wt 212.4 lb

## 2020-02-01 DIAGNOSIS — K760 Fatty (change of) liver, not elsewhere classified: Secondary | ICD-10-CM

## 2020-02-01 DIAGNOSIS — T148XXA Other injury of unspecified body region, initial encounter: Secondary | ICD-10-CM | POA: Diagnosis not present

## 2020-02-01 DIAGNOSIS — Z6835 Body mass index (BMI) 35.0-35.9, adult: Secondary | ICD-10-CM

## 2020-02-01 DIAGNOSIS — G4710 Hypersomnia, unspecified: Secondary | ICD-10-CM | POA: Diagnosis not present

## 2020-02-01 DIAGNOSIS — L247 Irritant contact dermatitis due to plants, except food: Secondary | ICD-10-CM | POA: Insufficient documentation

## 2020-02-01 DIAGNOSIS — R238 Other skin changes: Secondary | ICD-10-CM | POA: Insufficient documentation

## 2020-02-01 LAB — COMPREHENSIVE METABOLIC PANEL
ALT: 28 U/L (ref 0–35)
AST: 19 U/L (ref 0–37)
Albumin: 4.1 g/dL (ref 3.5–5.2)
Alkaline Phosphatase: 77 U/L (ref 39–117)
BUN: 12 mg/dL (ref 6–23)
CO2: 26 mEq/L (ref 19–32)
Calcium: 9.4 mg/dL (ref 8.4–10.5)
Chloride: 103 mEq/L (ref 96–112)
Creatinine, Ser: 0.78 mg/dL (ref 0.40–1.20)
GFR: 85.47 mL/min (ref >60.00)
Glucose, Bld: 102 mg/dL — ABNORMAL HIGH (ref 70–99)
Potassium: 4.1 mEq/L (ref 3.5–5.1)
Sodium: 137 mEq/L (ref 135–145)
Total Bilirubin: 0.3 mg/dL (ref 0.2–1.2)
Total Protein: 6.5 g/dL (ref 6.0–8.3)

## 2020-02-01 LAB — LIPID PANEL
Cholesterol: 223 mg/dL — ABNORMAL HIGH (ref 0–200)
HDL: 61.6 mg/dL (ref >39.00)
Total CHOL/HDL Ratio: 4
Triglycerides: 553 mg/dL — ABNORMAL HIGH (ref 0.0–149.0)

## 2020-02-01 LAB — TSH: TSH: 3.46 u[IU]/mL (ref 0.35–4.50)

## 2020-02-01 LAB — HEMOGLOBIN A1C: Hgb A1c MFr Bld: 5.5 % (ref 4.6–6.5)

## 2020-02-01 LAB — LDL CHOLESTEROL, DIRECT: Direct LDL: 77 mg/dL

## 2020-02-01 MED ORDER — AMPHETAMINE-DEXTROAMPHETAMINE 20 MG PO TABS: 20.0000 mg | ORAL_TABLET | Freq: Two times a day (BID) | ORAL | 0 refills | Status: DC

## 2020-02-01 MED ORDER — TRIAMCINOLONE ACETONIDE 0.1 % EX CREA: 1.0000 "application " | TOPICAL_CREAM | Freq: Two times a day (BID) | CUTANEOUS | 0 refills | Status: DC

## 2020-02-01 NOTE — Patient Instructions (Signed)
Substituting adderal 20 mg two times daily  Dose can be increased , just let me know what the most effective dose is   Not convinced you have herpes,  These lesions look nothing like it.  (Checking today)

## 2020-02-01 NOTE — Assessment & Plan Note (Signed)
changing to short acting methylphenidate

## 2020-02-01 NOTE — Assessment & Plan Note (Signed)
Rash on arm looks like contact dermatitis from recent yardwork.  Triamcinolone for forearm rash ,

## 2020-02-01 NOTE — Assessment & Plan Note (Addendum)
Painless, Occurring on lips.  No history of ulcerations.  does not resemble herpes. 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   Herpes simplex antibody testing but recommend using zinc containing sunblock on lips.

## 2020-02-01 NOTE — Progress Notes (Signed)
Subjective:  Patient ID: Rachael Cowan, female    DOB: 1985-12-27  Age: 34 y.o. MRN: [id number]  CC: The primary encounter diagnosis was Blistering of skin. Diagnoses of Hepatic steatosis, Class 2 severe obesity due to excess calories with serious comorbidity and body mass index (BMI) of 35.0 to 35.9 in adult [facility]), Hypersomnia, Vesicular rash, and Contact dermatitis and eczema due to plant were also pertinent to this visit.  HPI Rachael Cowan presents for  FOLLOW UP ON ADD AND blistering rash   This visit occurred during the SARS-CoV-2 public health emergency.  Safety protocols were in place, including screening questions prior to the visit, additional usage of staff PPE, and extensive cleaning of exam room while observing appropriate contact time as indicated for disinfecting solutions.    Patient has received both doses of the PFIZER COVID 19 vaccine without complications.  Patient continues to mask when outside of the home except when walking in yard or at safe distances from others .  Patient denies any change in mood or development of unhealthy behaviors resuting from the pandemic's restriction of activities and socialization.    She has developed a painless recurrent rash affecting mucous membranes of mouth,   2 episodes .  Lips become covered in tiny bumps along the vermilion, then develops red blotches on lips.  valacyclovir did not  Help.  Triggered by sun exposure   Narcolepsy:  insurance no longer covering metadate.    Outpatient Medications Prior to Visit  Medication Sig Dispense Refill  . dicyclomine (BENTYL) 10 MG capsule Take 1 capsule (10 mg total) by mouth 2 (two) times daily. 60 capsule 11  . loratadine (CLARITIN) 10 MG tablet Take 10 mg by mouth daily.    Marland Kitchen PREMARIN 0.9 MG tablet TAKE 1 TABLET (0.9 MG TOTAL) BY MOUTH DAILY. 90 tablet 1  . vitamin B-12 (CYANOCOBALAMIN) 1000 MCG tablet Take 1,000 mcg by mouth daily.     . methylphenidate (METADATE CD) 60 MG CR capsule Take  1 capsule (60 mg total) by mouth daily with breakfast. 30 capsule 0  . albuterol (PROVENTIL HFA;VENTOLIN HFA) 108 (90 Base) MCG/ACT inhaler Inhale 2 puffs into the lungs every 6 (six) hours as needed for wheezing or shortness of breath. (Patient not taking: Reported on 11/07/2019) 1 Inhaler 2  . fluticasone (FLONASE) 50 MCG/ACT nasal spray Place 2 sprays into both nostrils daily. (Patient not taking: Reported on 11/07/2019) 16 g 0  . montelukast (SINGULAIR) 10 MG tablet TAKE 1 TABLET BY MOUTH EVERYDAY AT BEDTIME (Patient not taking: Reported on 02/01/2020) 90 tablet 1  . Omega-3 Fatty Acids (FISH OIL) 1000 MG CAPS Take 1 capsule by mouth daily.     . valACYclovir (VALTREX) 1000 MG tablet Take 1 tablet (1,000 mg total) by mouth 2 (two) times daily. (Patient not taking: Reported on 02/01/2020) 20 tablet 0   No facility-administered medications prior to visit.    Review of Systems;  Patient denies headache, fevers, malaise, unintentional weight loss, skin rash, eye pain, sinus congestion and sinus pain, sore throat, dysphagia,  hemoptysis , cough, dyspnea, wheezing, chest pain, palpitations, orthopnea, edema, abdominal pain, nausea, melena, diarrhea, constipation, flank pain, dysuria, hematuria, urinary  Frequency, nocturia, numbness, tingling, seizures,  Focal weakness, Loss of consciousness,  Tremor, insomnia, depression, anxiety, and suicidal ideation.      Objective:  BP 104/80 (BP Location: Left Arm, Patient Position: Sitting, Cuff Size: Large)   Pulse 79   Temp (!) 97.4 F (36.3 C) (Temporal)  Resp 14   Ht 5\' 2"  (1.575 m)   Wt 212 lb 6.4 oz (96.3 kg)   LMP 10/25/2011   SpO2 98%   BMI 38.85 kg/m   BP Readings from Last 3 Encounters:  02/01/20 104/80  11/08/19 (!) 111/58  08/08/19 116/82    Wt Readings from Last 3 Encounters:  02/01/20 212 lb 6.4 oz (96.3 kg)  11/08/19 218 lb 7.6 oz (99.1 kg)  08/08/19 215 lb 12.8 oz (97.9 kg)    General appearance: alert, cooperative and appears  stated age Ears: normal TM's and external ear canals both ears Throat: lips, mucosa, and tongue normal; teeth and gums normal Neck: no adenopathy, no carotid bruit, supple, symmetrical, trachea midline and thyroid not enlarged, symmetric, no tenderness/mass/nodules Back: symmetric, no curvature. ROM normal. No CVA tenderness. Lungs: clear to auscultation bilaterally Heart: regular rate and rhythm, S1, S2 normal, no murmur, click, rub or gallop Abdomen: soft, non-tender; bowel sounds normal; no masses,  no organomegaly Pulses: 2+ and symmetric Skin: Skin color, texture, turgor normal. No rashes or lesions Lymph nodes: Cervical, supraclavicular, and axillary nodes normal.  Lab Results  Component Value Date   HGBA1C 5.5 02/01/2020   HGBA1C 5.6 07/17/2016    Lab Results  Component Value Date   CREATININE 0.78 02/01/2020   CREATININE 0.67 11/08/2019   CREATININE 0.75 11/04/2018    Lab Results  Component Value Date   WBC 5.7 11/08/2019   HGB 12.7 11/08/2019   HCT 37.0 11/08/2019   PLT 184 11/08/2019   GLUCOSE 102 (H) 02/01/2020   CHOL 223 (H) 02/01/2020   TRIG (H) 02/01/2020    553.0 Triglyceride is over 400; calculations on Lipids are invalid.   HDL 61.60 02/01/2020   LDLDIRECT 77.0 02/01/2020   ALT 28 02/01/2020   AST 19 02/01/2020   NA 137 02/01/2020   K 4.1 02/01/2020   CL 103 02/01/2020   CREATININE 0.78 02/01/2020   BUN 12 02/01/2020   CO2 26 02/01/2020   TSH 3.46 02/01/2020   HGBA1C 5.5 02/01/2020     Assessment & Plan:   Problem List Items Addressed This Visit      Unprioritized   Contact dermatitis and eczema due to plant     Rash on arm looks like contact dermatitis from recent yardwork.  Triamcinolone for forearm rash ,       Hepatic steatosis   Hypersomnia    changing to short acting methylphenidate       Obesity   Relevant Medications   amphetamine-dextroamphetamine (ADDERALL) 20 MG tablet   Vesicular rash    Painless, Occurring on lips.  No  history of ulcerations.  does not resemble herpes. 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   Herpes simplex antibody testing but recommend using zinc containing sunblock on lips.       Other Visit Diagnoses    Blistering of skin    -  Primary   Relevant Orders   HSV(herpes simplex vrs) 1+2 ab-IgG      I provided  30 minutes of  face-to-face time during this encounter reviewing patient's current problems and past surgeries, labs and imaging studies, providing counseling on the above mentioned problems , and coordination  of care . I have discontinued Ahriana L. Mollica's albuterol, fluticasone, montelukast, Fish Oil, valACYclovir, and methylphenidate. I am also having her start on triamcinolone cream and amphetamine-dextroamphetamine. Additionally, I am having her maintain her loratadine, dicyclomine, vitamin B-12, and Premarin.  Meds ordered this encounter  Medications  .  triamcinolone cream (KENALOG) 0.1 %    Sig: Apply 1 application topically 2 (two) times daily.    Dispense:  30 g    Refill:  0  . amphetamine-dextroamphetamine (ADDERALL) 20 MG tablet    Sig: Take 1 tablet (20 mg total) by mouth 2 (two) times daily.    Dispense:  60 tablet    Refill:  0    PATIENT CHANGING FROM METADATE DUE TO INTOLERANCE    Medications Discontinued During This Encounter  Medication Reason  . albuterol (PROVENTIL HFA;VENTOLIN HFA) 108 (90 Base) MCG/ACT inhaler Patient has not taken in last 30 days  . fluticasone (FLONASE) 50 MCG/ACT nasal spray Patient has not taken in last 30 days  . montelukast (SINGULAIR) 10 MG tablet Patient has not taken in last 30 days  . Omega-3 Fatty Acids (FISH OIL) 1000 MG CAPS Patient has not taken in last 30 days  . valACYclovir (VALTREX) 1000 MG tablet Completed Course  . methylphenidate (METADATE CD) 60 MG CR capsule     Follow-up: No follow-ups on file.   [name], MD

## 2020-02-02 LAB — HSV(HERPES SIMPLEX VRS) I + II AB-IGG
HAV 1 IGG,TYPE SPECIFIC AB: <0.9 index
HSV 2 IGG,TYPE SPECIFIC AB: <0.9 index

## 2020-02-09 ENCOUNTER — Other Ambulatory Visit: Payer: Self-pay | Admitting: Internal Medicine

## 2020-02-15 NOTE — Telephone Encounter (Signed)
Pt called to check on auth for medication. Please send MyChart message and call back asap

## 2020-02-20 NOTE — Telephone Encounter (Signed)
Pt states that insurance keeps closing the case because it has to be done within three days. Pt states that she needs this medication ASAP. Please call back

## 2020-02-29 ENCOUNTER — Other Ambulatory Visit: Payer: Self-pay | Admitting: Internal Medicine

## 2020-02-29 MED ORDER — ESTRADIOL 1 MG PO TABS: 1.0000 mg | ORAL_TABLET | Freq: Every day | ORAL | 5 refills | Status: DC

## 2020-03-01 ENCOUNTER — Other Ambulatory Visit: Payer: Self-pay

## 2020-03-01 MED ORDER — AMPHETAMINE-DEXTROAMPHETAMINE 20 MG PO TABS: 20.0000 mg | ORAL_TABLET | Freq: Two times a day (BID) | ORAL | 0 refills | Status: DC

## 2020-03-01 NOTE — Telephone Encounter (Signed)
Refill request for adderall, last seen 02-01-20, last filled 02-01-20.  Please advise.

## 2020-03-23 ENCOUNTER — Other Ambulatory Visit: Payer: Self-pay | Admitting: Internal Medicine

## 2020-04-03 ENCOUNTER — Other Ambulatory Visit: Payer: Self-pay

## 2020-04-03 NOTE — Telephone Encounter (Signed)
Refill request for adderall, last seen 02-01-20, last filled 03-01-20.  Please advise.

## 2020-04-04 MED ORDER — AMPHETAMINE-DEXTROAMPHETAMINE 20 MG PO TABS: 20.0000 mg | ORAL_TABLET | Freq: Two times a day (BID) | ORAL | 0 refills | Status: DC

## 2020-05-04 ENCOUNTER — Other Ambulatory Visit: Payer: Self-pay

## 2020-05-04 MED ORDER — AMPHETAMINE-DEXTROAMPHETAMINE 20 MG PO TABS: 20.0000 mg | ORAL_TABLET | Freq: Two times a day (BID) | ORAL | 0 refills | Status: DC

## 2020-06-05 ENCOUNTER — Other Ambulatory Visit: Payer: Self-pay

## 2020-06-05 MED ORDER — AMPHETAMINE-DEXTROAMPHETAMINE 20 MG PO TABS: 20.0000 mg | ORAL_TABLET | Freq: Two times a day (BID) | ORAL | 0 refills | Status: DC

## 2020-06-05 NOTE — Telephone Encounter (Signed)
Refill request for adderall, last seen 02-01-20, last filled 05-04-20.  Please advise.

## 2020-07-06 ENCOUNTER — Other Ambulatory Visit: Payer: Self-pay

## 2020-07-06 NOTE — Telephone Encounter (Signed)
Refilled: 06/05/2020 Last OV: 02/01/2020 Next OV: 08/03/2020

## 2020-07-07 MED ORDER — AMPHETAMINE-DEXTROAMPHETAMINE 20 MG PO TABS
20.0000 mg | ORAL_TABLET | Freq: Two times a day (BID) | ORAL | 0 refills | Status: DC
Start: 2020-07-07 — End: 2020-08-03

## 2020-08-03 ENCOUNTER — Telehealth: Payer: 59 | Admitting: Internal Medicine

## 2020-08-03 ENCOUNTER — Other Ambulatory Visit: Payer: Self-pay

## 2020-08-03 ENCOUNTER — Encounter: Payer: Self-pay | Admitting: Internal Medicine

## 2020-08-03 VITALS — Ht 62.0 in | Wt 212.4 lb

## 2020-08-03 DIAGNOSIS — E781 Pure hyperglyceridemia: Secondary | ICD-10-CM | POA: Diagnosis not present

## 2020-08-03 DIAGNOSIS — Z87448 Personal history of other diseases of urinary system: Secondary | ICD-10-CM

## 2020-08-03 DIAGNOSIS — E538 Deficiency of other specified B group vitamins: Secondary | ICD-10-CM | POA: Diagnosis not present

## 2020-08-03 DIAGNOSIS — R238 Other skin changes: Secondary | ICD-10-CM

## 2020-08-03 DIAGNOSIS — G4710 Hypersomnia, unspecified: Secondary | ICD-10-CM

## 2020-08-03 DIAGNOSIS — K760 Fatty (change of) liver, not elsewhere classified: Secondary | ICD-10-CM

## 2020-08-03 DIAGNOSIS — R21 Rash and other nonspecific skin eruption: Secondary | ICD-10-CM

## 2020-08-03 MED ORDER — METRONIDAZOLE 0.75 % EX GEL: 1.0000 "application " | Freq: Two times a day (BID) | CUTANEOUS | 0 refills | Status: DC

## 2020-08-03 MED ORDER — AMPHETAMINE-DEXTROAMPHETAMINE 20 MG PO TABS
20.0000 mg | ORAL_TABLET | Freq: Two times a day (BID) | ORAL | 0 refills | Status: DC
Start: 2020-09-02 — End: 2020-10-12

## 2020-08-03 MED ORDER — AMPHETAMINE-DEXTROAMPHETAMINE 20 MG PO TABS
20.0000 mg | ORAL_TABLET | Freq: Two times a day (BID) | ORAL | 0 refills | Status: DC
Start: 2020-08-06 — End: 2020-09-10

## 2020-08-03 NOTE — Progress Notes (Signed)
Virtual Visit via [place]  This visit type was conducted due to national recommendations for restrictions regarding the COVID-19 pandemic (e.g. social distancing).  This format is felt to be most appropriate for this patient at this time.  All issues noted in this document were discussed and addressed.  No physical exam was performed (except for noted visual exam findings with Video Visits).   I connected with@ on 08/03/20 at  9:30 AM EDT by a video enabled telemedicine application and verified that I am speaking with the correct person using two identifiers. Location patient: home Location provider: work or home office Persons participating in the virtual visit: patient, provider  I discussed the limitations, risks, security and privacy concerns of performing an evaluation and management service by telephone and the availability of in person appointments. I also discussed with the patient that there may be a patient responsible charge related to this service. The patient expressed understanding and agreed to proceed.   Reason for visit: medication refill  HPI:  34 yr old female with  Hypersomnia, managed with adderall .  Medications reviewed.  Taking 1/2 tablet in am and 1 tablet in afternoon usually.  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  No changes requested.    Previously examined rash attributed to sun exposure has been occurring, triggered by heat.  She notes that her cheeks stay red.  History suggestive of rosacea.  metrogel trial recommended.  High triglycerides noted during last visit  addressed.  Advised to return for fasting labs    ROS: See pertinent positives and negatives per HPI.  Past Medical History:  Diagnosis Date  . Allergy   . Asthma   . Gallstones   . Hypersomnolence disorder 2005   managed with metidate  . Rectal cancer ([place]) 05/13/2012   Resection with colostomy and hysterectomy.  . Sleep apnea 2005   resolved with ENT surgery,  [name]    Past Surgical History:  Procedure Laterality Date  . ABDOMINAL HYSTERECTOMY    . ARTHROSCOPIC REPAIR ACL  2006   right knee Dr Donna Christen [facility]  . CHOLECYSTECTOMY N/A 07/21/2016   Procedure: LAPAROSCOPIC CHOLECYSTECTOMY CONVERTED TO OPEN;  Surgeon: Olean Ree, MD;  Location: [facility] ORS;  Service: General;  Laterality: N/A;  . COLON SURGERY    . COLONOSCOPY WITH PROPOFOL N/A 12/14/2015   Procedure: COLONOSCOPY WITH PROPOFOL;  Surgeon: Manya Silvas, MD;  Location: [facility] ENDOSCOPY;  Service: Endoscopy;  Laterality: N/A;  . Colostomy Placement    . PORTA CATH REMOVAL N/A 11/06/2016   Procedure: Glori Luis Cath Removal;  Surgeon: Algernon Huxley, MD;  Location: [place] [facility];  Service: Cardiovascular;  Laterality: N/A;  . TONSILLECTOMY AND ADENOIDECTOMY      Family History  Problem Relation Age of Onset  . Prostate cancer Father   . Heart attack Maternal Grandfather   . Hyperlipidemia Paternal Grandfather   . Hypertension Paternal Grandfather   . Skin cancer Maternal Grandmother     SOCIAL HX:  reports that she has never smoked. She has never used smokeless tobacco. She reports current alcohol use. She reports that she does not use drugs.   Current Outpatient Medications:  .  [START ON 08/06/2020] amphetamine-dextroamphetamine (ADDERALL) 20 MG tablet, Take 1 tablet (20 mg total) by mouth 2 (two) times daily., Disp: 60 tablet, Rfl: 0 .  [START ON 09/02/2020] amphetamine-dextroamphetamine (ADDERALL) 20 MG tablet, Take 1 tablet (20 mg total) by mouth 2 (two) times daily., Disp:  60 tablet, Rfl: 0 .  dicyclomine (BENTYL) 10 MG capsule, TAKE 1 CAPSULE (10 MG TOTAL) BY MOUTH 2 (TWO) TIMES DAILY., Disp: 180 capsule, Rfl: 3 .  estradiol (ESTRACE) 1 MG tablet, TAKE 1 TABLET BY MOUTH EVERY DAY, Disp: 90 tablet, Rfl: 2 .  loratadine (CLARITIN) 10 MG tablet, Take 10 mg by mouth daily., Disp: , Rfl:  .  triamcinolone cream (KENALOG) 0.1 %, APPLY TO AFFECTED AREA TWICE A DAY, Disp: 30 g, Rfl:  0 .  metroNIDAZOLE (METROGEL) 0.75 % gel, Apply 1 application topically 2 (two) times daily. To affected area until resolved, Disp: 45 g, Rfl: 0  EXAM:  VITALS per patient if applicable:  GENERAL: alert, oriented, appears well and in no acute distress  HEENT: atraumatic, conjunttiva clear, no obvious abnormalities on inspection of external nose and ears  NECK: normal movements of the head and neck  LUNGS: on inspection no signs of respiratory distress, breathing rate appears normal, no obvious gross SOB, gasping or wheezing  CV: no obvious cyanosis  MS: moves all visible extremities without noticeable abnormality  PSYCH/NEURO: pleasant and cooperative, no obvious depression or anxiety, speech and thought processing grossly intact  ASSESSMENT AND PLAN:  Discussed the following assessment and plan:  B12 deficiency - Plan: CBC with Differential/Platelet  History of hematuria  Vesicular rash  Hypertriglyceridemia - Plan: Lipid panel  Hepatic steatosis - Plan: Comprehensive metabolic panel  Hypersomnia  Facial rash  Hypersomnia Managed with  short acting methylphenidate   Facial rash History and distribution suggestive of rosacea.  Trial of metrogel   Hypertriglyceridemia She will return for repeat labs  Lab Results  Component Value Date   CHOL 223 (H) 02/01/2020   HDL 61.60 02/01/2020   LDLDIRECT 77.0 02/01/2020   TRIG (H) 02/01/2020    553.0 Triglyceride is over 400; calculations on Lipids are invalid.   CHOLHDL 4 02/01/2020     I provided  20 minutes of  face-to-face time during this encounter reviewing patient's current problems and past surgeries, labs and imaging studies, providing counseling on the above mentioned problems , and coordination  of care .   I discussed the assessment and treatment plan with the patient. The patient was provided an opportunity to ask questions and all were answered. The patient agreed with the plan and demonstrated an  understanding of the instructions.   The patient was advised to call back or seek an in-person evaluation if the symptoms worsen or if the condition fails to improve as anticipated.   [name], MD

## 2020-08-05 DIAGNOSIS — E781 Pure hyperglyceridemia: Secondary | ICD-10-CM | POA: Insufficient documentation

## 2020-08-05 DIAGNOSIS — R21 Rash and other nonspecific skin eruption: Secondary | ICD-10-CM | POA: Insufficient documentation

## 2020-08-05 NOTE — Assessment & Plan Note (Signed)
Managed with  short acting methylphenidate

## 2020-08-05 NOTE — Assessment & Plan Note (Signed)
History and distribution suggestive of rosacea.  Trial of metrogel

## 2020-08-05 NOTE — Assessment & Plan Note (Signed)
She will return for repeat labs  Lab Results  Component Value Date   CHOL 223 (H) 02/01/2020   HDL 61.60 02/01/2020   LDLDIRECT 77.0 02/01/2020   TRIG (H) 02/01/2020    553.0 Triglyceride is over 400; calculations on Lipids are invalid.   CHOLHDL 4 02/01/2020

## 2020-09-10 ENCOUNTER — Other Ambulatory Visit: Payer: Self-pay

## 2020-09-11 MED ORDER — AMPHETAMINE-DEXTROAMPHETAMINE 20 MG PO TABS
20.0000 mg | ORAL_TABLET | Freq: Two times a day (BID) | ORAL | 0 refills | Status: DC
Start: 2020-09-11 — End: 2020-12-14

## 2020-10-12 ENCOUNTER — Other Ambulatory Visit: Payer: Self-pay

## 2020-10-12 MED ORDER — AMPHETAMINE-DEXTROAMPHETAMINE 20 MG PO TABS
20.0000 mg | ORAL_TABLET | Freq: Two times a day (BID) | ORAL | 0 refills | Status: DC
Start: 2020-10-12 — End: 2020-11-13

## 2020-10-12 NOTE — Telephone Encounter (Signed)
RX Refill:adderall Last Seen:08-03-20 Last ordered:09-11-20

## 2020-11-05 ENCOUNTER — Other Ambulatory Visit: Payer: Self-pay

## 2020-11-05 DIAGNOSIS — Z79890 Hormone replacement therapy (postmenopausal): Secondary | ICD-10-CM

## 2020-11-05 DIAGNOSIS — Z5181 Encounter for therapeutic drug level monitoring: Secondary | ICD-10-CM

## 2020-11-05 DIAGNOSIS — Z85048 Personal history of other malignant neoplasm of rectum, rectosigmoid junction, and anus: Secondary | ICD-10-CM

## 2020-11-05 NOTE — Progress Notes (Signed)
[facility]  [address], [address] [place], [place] [address] Phone: [phone]  Fax: [phone]   Clinic Day:  11/07/2020  Referring physician: [name], MD  Chief Complaint: Rachael Cowan is a 35 y.o. female with a history of stage IIIB rectal cancer who is seen for 1 year assessment.   HPI: The patient was last seen in the medical oncology clinic on 11/08/2019. At that time, she felt good.  She described chronic abdominal cramping felt secondary to scar tissue.  Exam was stable. Hematocrit was 37.0, hemoglobin 12.7, platelets 184,000, WBC 5,700. CEA was 1.0.  During the interim, she has been "good." She is having some right breast tenderness that comes and goes. She has some discomfort when she passes stool through her colostomy, which is stable. Sometimes the pain takes two minutes to pass and sometimes it takes 30 minutes. She thinks it may be due to scar tissue. She feels like "everything has dropped" in her abdomen.   The patient has lost some weight by making healthier food choices. She switched from Premarin to Estradiol about a year ago because her insurance stopping covering it. Her urinary incontinence is stable.   Past Medical History:  Diagnosis Date  . Allergy   . Asthma   . Gallstones   . Hypersomnolence disorder 2005   managed with metidate  . Rectal cancer ([place]) 05/13/2012   Resection with colostomy and hysterectomy.  . Sleep apnea 2005   resolved with ENT surgery,  [name]    Past Surgical History:  Procedure Laterality Date  . ABDOMINAL HYSTERECTOMY    . ARTHROSCOPIC REPAIR ACL  2006   right knee Dr Donna Christen at [facility]  . CHOLECYSTECTOMY N/A 07/21/2016   Procedure: LAPAROSCOPIC CHOLECYSTECTOMY CONVERTED TO OPEN;  Surgeon: Olean Ree, MD;  Location: [facility] ORS;  Service: General;  Laterality: N/A;  . COLON SURGERY    . COLONOSCOPY WITH PROPOFOL N/A 12/14/2015   Procedure: COLONOSCOPY WITH PROPOFOL;  Surgeon: Manya Silvas, MD;  Location: [facility];  Service: Endoscopy;  Laterality: N/A;  . Colostomy Placement    . PORTA CATH REMOVAL N/A 11/06/2016   Procedure: Glori Luis Cath Removal;  Surgeon: Algernon Huxley, MD;  Location: [place] [facility];  Service: Cardiovascular;  Laterality: N/A;  . TONSILLECTOMY AND ADENOIDECTOMY      Family History  Problem Relation Age of Onset  . Prostate cancer Father   . Heart attack Maternal Grandfather   . Hyperlipidemia Paternal Grandfather   . Hypertension Paternal Grandfather   . Skin cancer Maternal Grandmother     Social History:  reports that she has never smoked. She has never used smokeless tobacco. She reports current alcohol use. She reports that she does not use drugs. She lives in [place], [place], but plans on moving back to [place] where her mother and rest of her family lives.  She graduated in 08/2015.  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  She has 2 siblings.  She moved from [place] to [place]. She is currently in grad school to become a couselor. The patient is alone today.  Allergies: No Known Allergies  Current Medications: Current Outpatient Medications  Medication Sig Dispense Refill  . amphetamine-dextroamphetamine (ADDERALL) 20 MG tablet Take 1 tablet (20 mg total) by mouth 2 (two) times daily. 60 tablet 0  . amphetamine-dextroamphetamine (ADDERALL) 20 MG tablet Take 1 tablet (20 mg total) by mouth 2 (two) times daily. 60 tablet 0  .  dicyclomine (BENTYL) 10 MG capsule TAKE 1 CAPSULE (10 MG TOTAL) BY MOUTH 2 (TWO) TIMES DAILY. 180 capsule 3  . estradiol (ESTRACE) 1 MG tablet TAKE 1 TABLET BY MOUTH EVERY DAY 90 tablet 2  . loratadine (CLARITIN) 10 MG tablet Take 10 mg by mouth daily.    . metroNIDAZOLE (METROGEL) 0.75 % gel Apply 1 application topically 2 (two) times daily. To affected area until resolved 45 g 0  . triamcinolone cream (KENALOG) 0.1 % APPLY TO AFFECTED AREA TWICE A DAY 30 g 0   No current  facility-administered medications for this visit.    Review of Systems  Constitutional: Positive for weight loss (12 lbs). Negative for chills, diaphoresis, fever and malaise/fatigue.       Feels "good."  HENT: Negative for congestion, ear discharge, ear pain, hearing loss, nosebleeds, sinus pain, sore throat and tinnitus.   Eyes: Negative for blurred vision.  Respiratory: Negative for cough, hemoptysis, sputum production and shortness of breath.   Cardiovascular: Negative for chest pain, palpitations and leg swelling.  Gastrointestinal: Positive for abdominal pain (colostomy pain when she passes stool). Negative for blood in stool, constipation, diarrhea, heartburn, melena, nausea and vomiting.  Genitourinary: Negative for dysuria, frequency, hematuria and urgency.       Incontinence  Musculoskeletal: Negative for back pain, joint pain, myalgias and neck pain.       Right breast tenderness, comes and goes  Skin: Negative for itching and rash.  Neurological: Negative for dizziness, tingling, sensory change, weakness and headaches.  Endo/Heme/Allergies: Does not bruise/bleed easily.  Psychiatric/Behavioral: Negative for depression and memory loss. The patient is not nervous/anxious and does not have insomnia.   All other systems reviewed and are negative.   Performance status (ECOG): 1  Vitals Blood pressure 120/78, pulse 75, temperature 98.1 F (36.7 C), temperature source Tympanic, resp. rate 16, weight 206 lb 12.7 oz (93.8 kg), last menstrual period 10/25/2011, SpO2 100 %.   Physical Exam Vitals and nursing note reviewed.  Constitutional:      General: She is not in acute distress.    Appearance: She is not diaphoretic.  HENT:     Head: Normocephalic and atraumatic.     Mouth/Throat:     Mouth: Mucous membranes are moist.     Pharynx: Oropharynx is clear.  Eyes:     General: No scleral icterus.    Extraocular Movements: Extraocular movements intact.     Conjunctiva/sclera:  Conjunctivae normal.     Pupils: Pupils are equal, round, and reactive to light.     Comments: Blue eyes  Cardiovascular:     Rate and Rhythm: Normal rate and regular rhythm.     Heart sounds: Normal heart sounds. No murmur heard.   Pulmonary:     Effort: Pulmonary effort is normal. No respiratory distress.     Breath sounds: Normal breath sounds. No wheezing or rales.  Chest:     Chest wall: No tenderness.  Breasts:     Right: No axillary adenopathy or supraclavicular adenopathy.     Left: No axillary adenopathy or supraclavicular adenopathy.    Abdominal:     General: Bowel sounds are normal. There is no distension.     Palpations: Abdomen is soft. There is no mass.     Tenderness: There is no abdominal tenderness. There is no guarding or rebound.     Comments: Colostomy  Musculoskeletal:        General: No swelling or tenderness. Normal range of motion.  Cervical back: Normal range of motion and neck supple.  Lymphadenopathy:     Head:     Right side of head: No preauricular, posterior auricular or occipital adenopathy.     Left side of head: No preauricular, posterior auricular or occipital adenopathy.     Cervical: No cervical adenopathy.     Upper Body:     Right upper body: No supraclavicular or axillary adenopathy.     Left upper body: No supraclavicular or axillary adenopathy.     Lower Body: No right inguinal adenopathy. No left inguinal adenopathy.  Skin:    General: Skin is warm and dry.  Neurological:     Mental Status: She is alert and oriented to person, place, and time.  Psychiatric:        Behavior: Behavior normal.        Thought Content: Thought content normal.        Judgment: Judgment normal.    Appointment on 11/07/2020  Component Date Value Ref Range Status  . WBC 11/07/2020 5.4  4.0 - 10.5 K/uL Final  . RBC 11/07/2020 4.46  3.87 - 5.11 MIL/uL Final  . Hemoglobin 11/07/2020 13.4  12.0 - 15.0 g/dL Final  . HCT 11/07/2020 39.3  36.0 - 46.0 %  Final  . MCV 11/07/2020 88.1  80.0 - 100.0 fL Final  . MCH 11/07/2020 30.0  26.0 - 34.0 pg Final  . MCHC 11/07/2020 34.1  30.0 - 36.0 g/dL Final  . RDW 11/07/2020 13.1  11.5 - 15.5 % Final  . Platelets 11/07/2020 194  150 - 400 K/uL Final  . nRBC 11/07/2020 0.0  0.0 - 0.2 % Final  . Neutrophils Relative % 11/07/2020 53  % Final  . Neutro Abs 11/07/2020 2.8  1.7 - 7.7 K/uL Final  . Lymphocytes Relative 11/07/2020 37  % Final  . Lymphs Abs 11/07/2020 2.0  0.7 - 4.0 K/uL Final  . Monocytes Relative 11/07/2020 7  % Final  . Monocytes Absolute 11/07/2020 0.4  0.1 - 1.0 K/uL Final  . Eosinophils Relative 11/07/2020 1  % Final  . Eosinophils Absolute 11/07/2020 0.1  0.0 - 0.5 K/uL Final  . Basophils Relative 11/07/2020 1  % Final  . Basophils Absolute 11/07/2020 0.1  0.0 - 0.1 K/uL Final  . Immature Granulocytes 11/07/2020 1  % Final  . Abs Immature Granulocytes 11/07/2020 0.06  0.00 - 0.07 K/uL Final   Performed at [facility], [address] [place], [place] [address]    Assessment:  Rachael Cowan is a 35 y.o. female with a history of stage IIIB (T4N1M0) rectal cancer s/p neoadjuvant radiation and 5FU (completed on 03/07/2012) followed by rectosigmoidectomy, hysterectomy and oophorectomy on 05/13/2012 at [facility].  Pathologic stage was [id number].  She began post-operative FOLFOX chemotherapy in 05/2012.  She received 11 cycles (completed in 12/2012) secondary to neutropenia.  Abdomen and pelvic CT on 10/16/2015 noted the resection for distal rectal adenocarcinoma. There was stable appearance of the lower anatomic pelvis with no new or progressive soft tissue to suggest local recurrence.  There was a parastomal hernia containing an increased amount of pericolonic fat compared to the previous study.  Abdomen and pelvic CT on 10/15/2016 revealed no acute findings in the abdomen or pelvis.  There was no specific features identified to suggest recurrent rectal cancer or metastatic  disease.  There was left lower quadrant colostomy with fat containing parastomal hernia.  There was evidence of mild chronic inflammation of the distal colon  noted up to the level of the ostomy.  CEA has been followed:  1.0 on 12/19/2013, 0.8 on 05/08/2014, 1.1 on 10/12/2015, 0.9 on 04/25/2016, 1.1 on 10/23/2016, 1.4 on 10/23/2017, 1.2 on 11/04/2018, 1.0 on 11/08/2019, and 1.21 11/07/2020.   Colonoscopy on 12/14/2015 revealed one diminutive polyp in the sigmoid colon.  Pathology revealed a tubular adenoma with no evidence of dysplasia or malignancy.  Nodules at the top of the colostomy revealed inflamed and ulcerated colocutaneous tissue.  She has no family history of colon cancer.  My Risk genetic testing was negative per patient report.  Symptomatically, she feels "good."  She has some discomfort when she passes stool through her colostomy, which is stable. She has lost 12 pounds by making healthier food choices. Exam is stable.  Plan: 1.   Labs today: CBC with diff, CMP, CEA. 2.   Stage IIIB rectal cancer             Clinically, she is doing well.             CEA is 1.2 (normal).             Obtain colonoscopy report from [facility] in 2019.  Patient notes follow-up colonoscopy in 2021. 3.   RN: Call [facility] about possible testing and cost (prior My risk genetic testing). 4.   RTC in 1 year for MD assess and labs (CBC with diff, CMP, CEA).  I discussed the assessment and treatment plan with the patient.  The patient was provided an opportunity to ask questions and all were answered.  The patient agreed with the plan and demonstrated an understanding of the instructions.  The patient was advised to call back if the symptoms worsen or if the condition fails to improve as anticipated.   Lequita Asal, MD, PhD    11/07/2020, 9:32 AM  I, Mirian Mo Tufford, am acting as Education administrator for [employer]. Mike Gip, MD, PhD.  I, Trayven Lumadue C. Mike Gip, MD, have reviewed the above documentation for accuracy  and completeness, and I agree with the above.

## 2020-11-07 ENCOUNTER — Encounter: Payer: Self-pay | Admitting: Hematology and Oncology

## 2020-11-07 ENCOUNTER — Inpatient Hospital Stay: Payer: 59 | Attending: Hematology and Oncology | Admitting: Hematology and Oncology

## 2020-11-07 ENCOUNTER — Other Ambulatory Visit: Payer: Self-pay

## 2020-11-07 ENCOUNTER — Inpatient Hospital Stay: Payer: 59

## 2020-11-07 VITALS — BP 120/78 | HR 75 | Temp 98.1°F | Resp 16 | Wt 206.8 lb

## 2020-11-07 DIAGNOSIS — Z79890 Hormone replacement therapy (postmenopausal): Secondary | ICD-10-CM

## 2020-11-07 DIAGNOSIS — Z85048 Personal history of other malignant neoplasm of rectum, rectosigmoid junction, and anus: Secondary | ICD-10-CM | POA: Insufficient documentation

## 2020-11-07 DIAGNOSIS — Z90710 Acquired absence of both cervix and uterus: Secondary | ICD-10-CM | POA: Diagnosis not present

## 2020-11-07 DIAGNOSIS — Z808 Family history of malignant neoplasm of other organs or systems: Secondary | ICD-10-CM | POA: Insufficient documentation

## 2020-11-07 DIAGNOSIS — Z7289 Other problems related to lifestyle: Secondary | ICD-10-CM | POA: Insufficient documentation

## 2020-11-07 DIAGNOSIS — Z5181 Encounter for therapeutic drug level monitoring: Secondary | ICD-10-CM

## 2020-11-07 LAB — CBC WITH DIFFERENTIAL/PLATELET
Abs Immature Granulocytes: 0.06 10*3/uL (ref 0.00–0.07)
Basophils Absolute: 0.1 10*3/uL (ref 0.0–0.1)
Basophils Relative: 1 %
Eosinophils Absolute: 0.1 10*3/uL (ref 0.0–0.5)
Eosinophils Relative: 1 %
HCT: 39.3 % (ref 36.0–46.0)
Hemoglobin: 13.4 g/dL (ref 12.0–15.0)
Immature Granulocytes: 1 %
Lymphocytes Relative: 37 %
Lymphs Abs: 2 10*3/uL (ref 0.7–4.0)
MCH: 30 pg (ref 26.0–34.0)
MCHC: 34.1 g/dL (ref 30.0–36.0)
MCV: 88.1 fL (ref 80.0–100.0)
Monocytes Absolute: 0.4 10*3/uL (ref 0.1–1.0)
Monocytes Relative: 7 %
Neutro Abs: 2.8 10*3/uL (ref 1.7–7.7)
Neutrophils Relative %: 53 %
Platelets: 194 10*3/uL (ref 150–400)
RBC: 4.46 MIL/uL (ref 3.87–5.11)
RDW: 13.1 % (ref 11.5–15.5)
WBC: 5.4 10*3/uL (ref 4.0–10.5)
nRBC: 0 % (ref 0.0–0.2)

## 2020-11-07 LAB — COMPREHENSIVE METABOLIC PANEL
ALT: 24 U/L (ref 0–44)
AST: 21 U/L (ref 15–41)
Albumin: 4 g/dL (ref 3.5–5.0)
Alkaline Phosphatase: 69 U/L (ref 38–126)
Anion gap: 12 (ref 5–15)
BUN: 12 mg/dL (ref 6–20)
CO2: 24 mmol/L (ref 22–32)
Calcium: 9.2 mg/dL (ref 8.9–10.3)
Chloride: 98 mmol/L (ref 98–111)
Creatinine, Ser: 0.85 mg/dL (ref 0.44–1.00)
GFR, Estimated: >60 mL/min (ref >60)
Glucose, Bld: 104 mg/dL — ABNORMAL HIGH (ref 70–99)
Potassium: 3.8 mmol/L (ref 3.5–5.1)
Sodium: 134 mmol/L — ABNORMAL LOW (ref 135–145)
Total Bilirubin: 0.4 mg/dL (ref 0.3–1.2)
Total Protein: 7.3 g/dL (ref 6.5–8.1)

## 2020-11-07 NOTE — Progress Notes (Signed)
Patient stated that she has been having some upper breast pain

## 2020-11-08 LAB — CEA: CEA: 1.2 ng/mL (ref 0.0–4.7)

## 2020-11-13 ENCOUNTER — Other Ambulatory Visit: Payer: Self-pay

## 2020-11-13 MED ORDER — AMPHETAMINE-DEXTROAMPHETAMINE 20 MG PO TABS: 20.0000 mg | ORAL_TABLET | Freq: Two times a day (BID) | ORAL | 0 refills | Status: DC

## 2020-11-13 NOTE — Telephone Encounter (Signed)
RX Refill:Adderall Last Seen:08-03-20 Last ordered:10-12-20

## 2020-12-03 ENCOUNTER — Telehealth: Payer: Self-pay

## 2020-12-03 NOTE — Telephone Encounter (Signed)
-----   Message from Lequita Asal, MD sent at 12/01/2020  6:37 PM EST ----- Regarding: Please call patient  Please call patient about where she had  her last colonoscopy.  We would like to get a copy of it for her records.  M

## 2020-12-14 ENCOUNTER — Other Ambulatory Visit: Payer: Self-pay

## 2020-12-15 MED ORDER — AMPHETAMINE-DEXTROAMPHETAMINE 20 MG PO TABS: 20.0000 mg | ORAL_TABLET | Freq: Two times a day (BID) | ORAL | 0 refills | Status: DC

## 2021-01-04 ENCOUNTER — Other Ambulatory Visit: Payer: Self-pay | Admitting: Internal Medicine

## 2021-01-15 ENCOUNTER — Other Ambulatory Visit: Payer: Self-pay

## 2021-01-16 MED ORDER — AMPHETAMINE-DEXTROAMPHETAMINE 20 MG PO TABS: 20.0000 mg | ORAL_TABLET | Freq: Two times a day (BID) | ORAL | 0 refills | Status: DC

## 2021-01-16 NOTE — Telephone Encounter (Signed)
Pharmacy should have one for April refill that was written on march 19.

## 2021-02-05 ENCOUNTER — Other Ambulatory Visit: Payer: Self-pay | Admitting: Internal Medicine

## 2021-05-31 ENCOUNTER — Other Ambulatory Visit: Payer: Self-pay

## 2021-06-04 MED ORDER — AMPHETAMINE-DEXTROAMPHETAMINE 20 MG PO TABS: 20.0000 mg | ORAL_TABLET | Freq: Two times a day (BID) | ORAL | 0 refills | Status: DC

## 2021-06-04 NOTE — Telephone Encounter (Signed)
Med last sent in 619/22 and Patient last seen 02/01/20  Pended for your approval or denial.

## 2021-07-04 ENCOUNTER — Other Ambulatory Visit: Payer: Self-pay

## 2021-07-04 MED ORDER — AMPHETAMINE-DEXTROAMPHETAMINE 20 MG PO TABS: 20.0000 mg | ORAL_TABLET | Freq: Two times a day (BID) | ORAL | 0 refills | Status: DC

## 2021-07-04 NOTE — Telephone Encounter (Signed)
RX Refill:adderall Last Seen:08-03-20 Last ordered:06-04-21

## 2021-07-04 NOTE — Telephone Encounter (Signed)
LAST REFILL  OF ADDERALL WITHOUT IN OFFICE VISIT , PLEASE SCHEDULE

## 2021-08-05 ENCOUNTER — Telehealth: Payer: Self-pay | Admitting: Internal Medicine

## 2021-08-05 ENCOUNTER — Other Ambulatory Visit: Payer: Self-pay

## 2021-08-05 NOTE — Telephone Encounter (Signed)
Denied.  See mychart message set

## 2021-08-05 NOTE — Telephone Encounter (Signed)
RX Refill: adderall Last Seen: 08-03-20 Last Ordered: 07-04-21 Next Appt: 08-19-21

## 2021-08-06 ENCOUNTER — Other Ambulatory Visit: Payer: Self-pay

## 2021-08-06 ENCOUNTER — Ambulatory Visit
Admission: RE | Admit: 2021-08-06 | Discharge: 2021-08-06 | Disposition: A | Discharge status: Home / Self Care | Payer: 59 | Source: Ambulatory Visit | Attending: Internal Medicine | Admitting: Internal Medicine

## 2021-08-06 VITALS — BP 113/79 | HR 96 | Temp 98.3°F | Resp 16

## 2021-08-06 DIAGNOSIS — I889 Nonspecific lymphadenitis, unspecified: Secondary | ICD-10-CM | POA: Diagnosis not present

## 2021-08-06 DIAGNOSIS — J069 Acute upper respiratory infection, unspecified: Secondary | ICD-10-CM | POA: Diagnosis not present

## 2021-08-06 LAB — POCT MONO SCREEN (KUC): Mono, POC: NEGATIVE

## 2021-08-06 MED ORDER — CEPHALEXIN 500 MG PO CAPS: 500.0000 mg | ORAL_CAPSULE | Freq: Three times a day (TID) | ORAL | 0 refills | Status: AC

## 2021-08-06 MED ORDER — BENZONATATE 200 MG PO CAPS: 200.0000 mg | ORAL_CAPSULE | Freq: Two times a day (BID) | ORAL | 0 refills | Status: DC | PRN

## 2021-08-06 NOTE — Discharge Instructions (Signed)
You may try an antibiotic to see if will help the lymphnodes resolve, but if they continue, you ned to get lab work when you see your primary care doctor this month

## 2021-08-06 NOTE — Telephone Encounter (Signed)
Pt is scheduled for 08/07/2021

## 2021-08-06 NOTE — ED Provider Notes (Signed)
[facility]    CSN: [id number] Arrival date & time: 08/06/21  1055      History   Chief Complaint Chief Complaint  Patient presents with   Cough   Lymphadenopathy    HPI Rachael Cowan is a 35 y.o. female who presents with swollen lymp nodes on her L neck x 1 week. Has some other swollen places on her scalp as well. Denies having any wounds or scabs on her scalp. Has been fatigued. Denies sweats, URI or unintentional wt loss. Does not have cats and has not been scratched by one.   2- She also developed a non productive cough and rhinitis since yesterday. Denies fever, body aches, HA.     Past Medical History:  Diagnosis Date   Allergy    Asthma    Gallstones    Hypersomnolence disorder 2005   managed with metidate   Rectal cancer ([place]) 05/13/2012   Resection with colostomy and hysterectomy.   Sleep apnea 2005   resolved with ENT surgery,  [name]    Patient Active Problem List   Diagnosis Date Noted   Facial rash 08/05/2020   Hypertriglyceridemia 08/05/2020   Vesicular rash 02/01/2020   Contact dermatitis and eczema due to plant 02/01/2020   Encounter for monitoring estrogen replacement therapy following surgical menopause 07/15/2019   Hyponatremia 01/30/2019   S/P colostomy ([place]) 01/30/2019   Educated about COVID-19 virus infection 01/30/2019   Abnormal breast exam 08/01/2018   Encounter for preventive health examination 06/17/2017   S/P cholecystectomy 09/10/2016   History of hematuria 07/19/2016   Hepatic steatosis 07/19/2016   B12 deficiency 06/23/2013   Obesity 06/23/2013   Other and unspecified hyperlipidemia 06/23/2013   Generalized abdominal cramping 06/01/2012   Menopause 05/13/2012   Airway hyperreactivity 05/07/2012   Hypersomnia 05/07/2012   Asthma 05/07/2012   History of rectal cancer 01/13/2012   Primary hypersomnolence disorder 11/12/2011    Past Surgical History:  Procedure Laterality Date   ABDOMINAL HYSTERECTOMY      ARTHROSCOPIC REPAIR ACL  2006   right knee Dr Donna Christen at [facility] 07/21/2016   Procedure: LAPAROSCOPIC CHOLECYSTECTOMY CONVERTED TO OPEN;  Surgeon: Olean Ree, MD;  Location: [facility] ORS;  Service: General;  Laterality: N/A;   COLON SURGERY     COLONOSCOPY WITH PROPOFOL N/A 12/14/2015   Procedure: COLONOSCOPY WITH PROPOFOL;  Surgeon: Manya Silvas, MD;  Location: [place];  Service: Endoscopy;  Laterality: N/A;   Colostomy Placement     PORTA CATH REMOVAL N/A 11/06/2016   Procedure: Glori Luis Cath Removal;  Surgeon: Algernon Huxley, MD;  Location: [place] [facility];  Service: Cardiovascular;  Laterality: N/A;   TONSILLECTOMY AND ADENOIDECTOMY      OB History   No obstetric history on file.      Home Medications    Prior to Admission medications   Medication Sig Start Date End Date Taking? Authorizing Provider  amphetamine-dextroamphetamine (ADDERALL) 20 MG tablet Take 1 tablet (20 mg total) by mouth 2 (two) times daily. 01/15/21  Yes [name], MD  benzonatate (TESSALON) 200 MG capsule Take 1 capsule (200 mg total) by mouth 2 (two) times daily as needed for cough. 08/06/21  Yes Rodriguez-Southworth, Sunday Spillers, PA-C  cephALEXin (KEFLEX) 500 MG capsule Take 1 capsule (500 mg total) by mouth 3 (three) times daily for 7 days. 08/06/21 08/13/21 Yes Rodriguez-Southworth, Sunday Spillers, PA-C  dicyclomine (BENTYL) 10 MG capsule TAKE 1 CAPSULE (10 MG TOTAL) BY MOUTH 2 (TWO)  TIMES DAILY. 02/05/21  Yes [name], MD  estradiol (ESTRACE) 1 MG tablet TAKE 1 TABLET BY MOUTH EVERY DAY 01/04/21  Yes [name], MD  loratadine (CLARITIN) 10 MG tablet Take 10 mg by mouth daily.   Yes [provider]  amphetamine-dextroamphetamine (ADDERALL) 20 MG tablet Take 1 tablet (20 mg total) by mouth 2 (two) times daily. 01/16/21   [name], MD  amphetamine-dextroamphetamine (ADDERALL) 20 MG tablet Take 1 tablet (20 mg total) by mouth 2 (two) times daily. 07/04/21   [name],  MD  metroNIDAZOLE (METROGEL) 0.75 % gel Apply 1 application topically 2 (two) times daily. To affected area until resolved 08/03/20   [name], MD  triamcinolone cream (KENALOG) 0.1 % APPLY TO AFFECTED AREA TWICE A DAY 02/09/20   [name], MD    Family History Family History  Problem Relation Age of Onset   Prostate cancer Father    Heart attack Maternal Grandfather    Hyperlipidemia Paternal Grandfather    Hypertension Paternal Grandfather    Skin cancer Maternal Grandmother     Social History Social History   Tobacco Use   Smoking status: Never   Smokeless tobacco: Never  Vaping Use   Vaping Use: Never used  Substance Use Topics   Alcohol use: Yes    Comment: ocassional   Drug use: No     Allergies   Patient has no known allergies.   Review of Systems Review of Systems  Constitutional:  Positive for fatigue. Negative for appetite change, diaphoresis, fever and unexpected weight change.  HENT:  Positive for congestion, postnasal drip and rhinorrhea. Negative for ear discharge, ear pain, sore throat and trouble swallowing.   Eyes:  Negative for discharge.  Respiratory:  Positive for cough.   Musculoskeletal:  Negative for myalgias.  Skin:  Negative for rash.  Neurological:  Negative for headaches.  Hematological:  Positive for adenopathy.    Physical Exam Triage Vital Signs ED Triage Vitals [08/06/21 1109]  Enc Vitals Group     BP 113/79     Pulse Rate 96     Resp 16     Temp 98.3 F (36.8 C)     Temp Source Oral     SpO2 95 %     Weight      Height      Head Circumference      Peak Flow      Pain Score      Pain Loc      Pain Edu?      Excl. in [place]?    No data found.  Updated Vital Signs BP 113/79 (BP Location: Left Arm)   Pulse 96   Temp 98.3 F (36.8 C) (Oral)   Resp 16   LMP 10/25/2011   SpO2 95%   Visual Acuity Right Eye Distance:   Left Eye Distance:   Bilateral Distance:    Right Eye Near:   Left Eye Near:     Bilateral Near:     Physical Exam Vitals and nursing note reviewed.  Constitutional:      General: She is not in acute distress.    Appearance: She is obese. She is not toxic-appearing.  HENT:     Head: Atraumatic.     Right Ear: Tympanic membrane, ear canal and external ear normal.     Left Ear: Tympanic membrane, ear canal and external ear normal.     Nose: Nose normal.  Mouth/Throat:     Mouth: Mucous membranes are moist.     Pharynx: Oropharynx is clear.  Eyes:     General: No scleral icterus.    Conjunctiva/sclera: Conjunctivae normal.  Cardiovascular:     Heart sounds: No murmur heard. Pulmonary:     Effort: Pulmonary effort is normal.     Breath sounds: Normal breath sounds.  Musculoskeletal:        General: Normal range of motion.     Cervical back: Neck supple.  Lymphadenopathy:     Cervical: Cervical adenopathy present.     Left cervical: Posterior cervical adenopathy present.     Comments: Has pea size mobile nodes on L occipital and L parietal region. The node on the R mid posterior chain is 2 cm x 2 cm, soft and mobile.   Skin:    General: Skin is warm and dry.     Findings: No erythema, lesion or rash.  Neurological:     Mental Status: She is alert and oriented to person, place, and time.     Gait: Gait normal.  Psychiatric:        Mood and Affect: Mood normal.        Behavior: Behavior normal.        Thought Content: Thought content normal.        Judgment: Judgment normal.     UC Treatments / Results  Labs (all labs ordered are listed, but only abnormal results are displayed) Labs Reviewed  POCT MONO SCREEN ([facility])  Mono is neg   EKG   Radiology No results found.  Procedures Procedures (including critical care time)  Medications Ordered in UC Medications - No data to display  Initial Impression / Assessment and Plan / UC Course  I have reviewed the triage vital signs and the nursing notes. Pertinent labs results that were available  during my care of the patient were reviewed by me and considered in my medical decision making (see chart for details).  Lymphadenitis  URI I placed her on Keflex to see if this will help resolve the nodes. Was encouraged to keep apt with PCP this month and if nodes are still present may needs more labs done.    Final Clinical Impressions(s) / UC Diagnoses   Final diagnoses:  Lymphadenitis  Acute upper respiratory infection     Discharge Instructions      You may try an antibiotic to see if will help the lymphnodes resolve, but if they continue, you ned to get lab work when you see your primary care doctor this month      ED Prescriptions     Medication Sig Dispense Auth. Provider   cephALEXin (KEFLEX) 500 MG capsule Take 1 capsule (500 mg total) by mouth 3 (three) times daily for 7 days. 21 capsule Rodriguez-Southworth, Journe Hallmark, PA-C   benzonatate (TESSALON) 200 MG capsule Take 1 capsule (200 mg total) by mouth 2 (two) times daily as needed for cough. 30 capsule Rodriguez-Southworth, Sunday Spillers, PA-C      PDMP not reviewed this encounter.   Shelby Mattocks, Hershal Coria 08/06/21 2029

## 2021-08-06 NOTE — ED Triage Notes (Signed)
Pt c/o swollen lymph nodes on the left side of her neck x 1 week and a few swollen places on her scalp as well. She also developed a non productive cough and runny nose yesterday.

## 2021-08-07 ENCOUNTER — Other Ambulatory Visit: Payer: Self-pay

## 2021-08-07 ENCOUNTER — Ambulatory Visit (INDEPENDENT_AMBULATORY_CARE_PROVIDER_SITE_OTHER): Payer: 59 | Admitting: Internal Medicine

## 2021-08-07 ENCOUNTER — Encounter: Payer: Self-pay | Admitting: Internal Medicine

## 2021-08-07 VITALS — BP 118/76 | HR 87 | Temp 96.7°F | Ht 62.0 in | Wt 209.4 lb

## 2021-08-07 DIAGNOSIS — E781 Pure hyperglyceridemia: Secondary | ICD-10-CM | POA: Diagnosis not present

## 2021-08-07 DIAGNOSIS — K760 Fatty (change of) liver, not elsewhere classified: Secondary | ICD-10-CM

## 2021-08-07 DIAGNOSIS — I889 Nonspecific lymphadenitis, unspecified: Secondary | ICD-10-CM

## 2021-08-07 DIAGNOSIS — E538 Deficiency of other specified B group vitamins: Secondary | ICD-10-CM

## 2021-08-07 DIAGNOSIS — F5112 Insufficient sleep syndrome: Secondary | ICD-10-CM

## 2021-08-07 DIAGNOSIS — J101 Influenza due to other identified influenza virus with other respiratory manifestations: Secondary | ICD-10-CM | POA: Insufficient documentation

## 2021-08-07 LAB — POC COVID19 BINAXNOW: SARS Coronavirus 2 Ag: NEGATIVE

## 2021-08-07 LAB — POC INFLUENZA A&B (BINAX/QUICKVUE)
Influenza A, POC: POSITIVE — AB
Influenza B, POC: NEGATIVE

## 2021-08-07 MED ORDER — AMPHETAMINE-DEXTROAMPHETAMINE 20 MG PO TABS: 20.0000 mg | ORAL_TABLET | Freq: Two times a day (BID) | ORAL | 0 refills | Status: DC

## 2021-08-07 MED ORDER — OSELTAMIVIR PHOSPHATE 75 MG PO CAPS: 75.0000 mg | ORAL_CAPSULE | Freq: Two times a day (BID) | ORAL | 0 refills | Status: DC

## 2021-08-07 NOTE — Assessment & Plan Note (Addendum)
Managed with  short acting methylphenidate .  No changes to regimen today. Refills given

## 2021-08-07 NOTE — Assessment & Plan Note (Signed)
Presenting with lumphadenitis and cough   Tamiflu given.  Quarantine advised for 5 days

## 2021-08-07 NOTE — Progress Notes (Signed)
Subjective:  Patient ID: Rachael Cowan, female    DOB: 1986/01/20  Age: 35 y.o. MRN: [id number]  CC: The primary encounter diagnosis was B12 deficiency. Diagnoses of Hepatic steatosis, Hypertriglyceridemia, Lymphadenitis, Primary hypersomnolence disorder, and Influenza A were also pertinent to this visit.  HPI Rachael Cowan presents for  Chief Complaint  Patient presents with   Follow-up    This visit occurred during the SARS-CoV-2 public health emergency.  Safety protocols were in place, including screening questions prior to the visit, additional usage of staff PPE, and extensive cleaning of exam room while observing appropriate contact time as indicated for disinfecting solutions.   Treated at urgent care yesterday for  lymphadenitis  symptoms which started last week  as swollen lymph nodes on occipital scalp and left sided posterior cervical chain.  Nonproductive cough started yesterday.  Treated at urgent care with cephalexin and benzona tate without being tested for COVID or flu. Has not started either medication    Outpatient Medications Prior to Visit  Medication Sig Dispense Refill   benzonatate (TESSALON) 200 MG capsule Take 1 capsule (200 mg total) by mouth 2 (two) times daily as needed for cough. 30 capsule 0   cephALEXin (KEFLEX) 500 MG capsule Take 1 capsule (500 mg total) by mouth 3 (three) times daily for 7 days. 21 capsule 0   dicyclomine (BENTYL) 10 MG capsule TAKE 1 CAPSULE (10 MG TOTAL) BY MOUTH 2 (TWO) TIMES DAILY. 180 capsule 1   estradiol (ESTRACE) 1 MG tablet TAKE 1 TABLET BY MOUTH EVERY DAY 90 tablet 2   loratadine (CLARITIN) 10 MG tablet Take 10 mg by mouth daily.     triamcinolone cream (KENALOG) 0.1 % APPLY TO AFFECTED AREA TWICE A DAY 30 g 0   amphetamine-dextroamphetamine (ADDERALL) 20 MG tablet Take 1 tablet (20 mg total) by mouth 2 (two) times daily. 60 tablet 0   amphetamine-dextroamphetamine (ADDERALL) 20 MG tablet Take 1 tablet (20 mg total) by mouth 2  (two) times daily. 60 tablet 0   amphetamine-dextroamphetamine (ADDERALL) 20 MG tablet Take 1 tablet (20 mg total) by mouth 2 (two) times daily. 60 tablet 0   metroNIDAZOLE (METROGEL) 0.75 % gel Apply 1 application topically 2 (two) times daily. To affected area until resolved (Patient not taking: Reported on 08/07/2021) 45 g 0   No facility-administered medications prior to visit.    Review of Systems;  Patient denies headache, fevers, malaise, unintentional weight loss, skin rash, eye pain, sinus congestion and sinus pain, sore throat, dysphagia,  hemoptysis ,dyspnea, wheezing, chest pain, palpitations, orthopnea, edema, abdominal pain, nausea, melena, diarrhea, constipation, flank pain, dysuria, hematuria, urinary  Frequency, nocturia, numbness, tingling, seizures,  Focal weakness, Loss of consciousness,  Tremor, insomnia, depression, anxiety, and suicidal ideation.      Objective:  BP 118/76 (BP Location: Left Arm, Patient Position: Sitting, Cuff Size: Large)   Pulse 87   Temp (!) 96.7 F (35.9 C) (Temporal)   Ht 5\' 2"  (1.575 m)   Wt 209 lb 6.4 oz (95 kg)   LMP 10/25/2011   SpO2 98%   BMI 38.30 kg/m   BP Readings from Last 3 Encounters:  08/07/21 118/76  08/06/21 113/79  11/07/20 120/78    Wt Readings from Last 3 Encounters:  08/07/21 209 lb 6.4 oz (95 kg)  11/07/20 206 lb 12.7 oz (93.8 kg)  08/03/20 212 lb 6.4 oz (96.3 kg)    General appearance: alert, cooperative and appears stated age Ears: normal TM's and external  ear canals both ears Throat: lips, mucosa, and tongue normal; teeth and gums normal Neck: left posterior cervical chain lymphadenopathy, occipital lymphadenopathy no carotid bruit, supple, symmetrical, trachea midline and thyroid not enlarged, symmetric, no tenderness/mass/nodules Back: symmetric, no curvature. ROM normal. No CVA tenderness. Lungs: clear to auscultation bilaterally Heart: regular rate and rhythm, S1, S2 normal, no murmur, click, rub or  gallop Abdomen: soft, non-tender; bowel sounds normal; no masses,  no organomegaly Pulses: 2+ and symmetric Skin: Skin color, texture, turgor normal. No rashes or lesions Lymph nodes: Cervical, supraclavicular, and axillary nodes normal.  Lab Results  Component Value Date   HGBA1C 5.5 02/01/2020   HGBA1C 5.6 07/17/2016    Lab Results  Component Value Date   CREATININE 0.85 11/07/2020   CREATININE 0.78 02/01/2020   CREATININE 0.67 11/08/2019    Lab Results  Component Value Date   WBC 5.4 11/07/2020   HGB 13.4 11/07/2020   HCT 39.3 11/07/2020   PLT 194 11/07/2020   GLUCOSE 104 (H) 11/07/2020   CHOL 223 (H) 02/01/2020   TRIG (H) 02/01/2020    553.0 Triglyceride is over 400; calculations on Lipids are invalid.   HDL 61.60 02/01/2020   LDLDIRECT 77.0 02/01/2020   ALT 24 11/07/2020   AST 21 11/07/2020   NA 134 (L) 11/07/2020   K 3.8 11/07/2020   CL 98 11/07/2020   CREATININE 0.85 11/07/2020   BUN 12 11/07/2020   CO2 24 11/07/2020   TSH 3.46 02/01/2020   HGBA1C 5.5 02/01/2020    No results found.  Assessment & Plan:   Problem List Items Addressed This Visit     Primary hypersomnolence disorder    Managed with  short acting methylphenidate .  No changes to regimen today. Refills given      B12 deficiency - Primary   Relevant Orders   B12 and Folate Panel   Hepatic steatosis   Relevant Orders   Comprehensive metabolic panel   Hypertriglyceridemia   Relevant Orders   Lipid panel   TSH   Influenza A    Presenting with lumphadenitis and cough   Tamiflu given.  Quarantine advised for 5 days       Relevant Medications   oseltamivir (TAMIFLU) 75 MG capsule   RESOLVED: Lymphadenitis   Relevant Orders   POC COVID-19   POC Influenza A&B (Binax test)    I am having Rachael Cowan start on oseltamivir. I am also having her maintain her loratadine, triamcinolone cream, metroNIDAZOLE, estradiol, dicyclomine, cephALEXin, benzonatate, amphetamine-dextroamphetamine,  amphetamine-dextroamphetamine, and amphetamine-dextroamphetamine.  Meds ordered this encounter  Medications   oseltamivir (TAMIFLU) 75 MG capsule    Sig: Take 1 capsule (75 mg total) by mouth 2 (two) times daily.    Dispense:  10 capsule    Refill:  0   amphetamine-dextroamphetamine (ADDERALL) 20 MG tablet    Sig: Take 1 tablet (20 mg total) by mouth 2 (two) times daily.    Dispense:  60 tablet    Refill:  0    Do not refill less than 30 days from prior refill   amphetamine-dextroamphetamine (ADDERALL) 20 MG tablet    Sig: Take 1 tablet (20 mg total) by mouth 2 (two) times daily.    Dispense:  60 tablet    Refill:  0    Do not refill less than 30 days from prior refill   amphetamine-dextroamphetamine (ADDERALL) 20 MG tablet    Sig: Take 1 tablet (20 mg total) by mouth 2 (two) times daily.  Dispense:  60 tablet    Refill:  0    Do not refill less than 30 days from prior refill    Medications Discontinued During This Encounter  Medication Reason   amphetamine-dextroamphetamine (ADDERALL) 20 MG tablet Reorder   amphetamine-dextroamphetamine (ADDERALL) 20 MG tablet Reorder   amphetamine-dextroamphetamine (ADDERALL) 20 MG tablet Reorder    Follow-up: No follow-ups on file.   [name], MD

## 2021-08-07 NOTE — Assessment & Plan Note (Signed)
She never returned for fasting repeat labs and cannot get them today due to her current illness.  Labs ordered.   Lab Results  Component Value Date   CHOL 223 (H) 02/01/2020   HDL 61.60 02/01/2020   LDLDIRECT 77.0 02/01/2020   TRIG (H) 02/01/2020    553.0 Triglyceride is over 400; calculations on Lipids are invalid.   CHOLHDL 4 02/01/2020

## 2021-08-07 NOTE — Patient Instructions (Signed)
You have influenza A  Start the tamiflu tonight  Cancel all activities throught the weekend.  Tylenol and motrin for any body aches   Save the keflex for a skin infection  If you develop sinusitis or productive cough,  call the office to request an antibiotic

## 2021-08-15 ENCOUNTER — Other Ambulatory Visit: Payer: Self-pay | Admitting: Internal Medicine

## 2021-08-19 ENCOUNTER — Ambulatory Visit: Payer: 59 | Admitting: Internal Medicine

## 2021-09-20 ENCOUNTER — Telehealth: Payer: 59 | Admitting: Emergency Medicine

## 2021-09-20 DIAGNOSIS — J069 Acute upper respiratory infection, unspecified: Secondary | ICD-10-CM | POA: Diagnosis not present

## 2021-09-20 MED ORDER — FLUTICASONE PROPIONATE 50 MCG/ACT NA SUSP: 2.0000 | Freq: Every day | NASAL | 0 refills | Status: DC

## 2021-09-20 NOTE — Progress Notes (Signed)
E-Visit for Upper Respiratory Infection   We are sorry you are not feeling well.  Here is how we plan to help!  Based on what you have shared with me, it looks like you may have a viral upper respiratory infection.  Upper respiratory infections are caused by a large number of viruses; however, rhinovirus is the most common cause.  I think it is unlikely that you have strep throat as it is very uncommon for strep throat because a runny or congested nose.  Symptoms vary from person to person, with common symptoms including sore throat, cough, fatigue or lack of energy and feeling of general discomfort.  A low-grade fever of up to 100.4 may present, but is often uncommon.  Symptoms vary however, and are closely related to a person's age or underlying illnesses.  The most common symptoms associated with an upper respiratory infection are nasal discharge or congestion, cough, sneezing, headache and pressure in the ears and face.  These symptoms usually persist for about 3 to 10 days, but can last up to 2 weeks.  It is important to know that upper respiratory infections do not cause serious illness or complications in most cases.    Upper respiratory infections can be transmitted from person to person, with the most common method of transmission being a person's hands.  The virus is able to live on the skin and can infect other persons for up to 2 hours after direct contact.  Also, these can be transmitted when someone coughs or sneezes; thus, it is important to cover the mouth to reduce this risk.  To keep the spread of the illness at [place], good hand hygiene is very important.  This is an infection that is most likely caused by a virus. There are no specific treatments other than to help you with the symptoms until the infection runs its course.  We are sorry you are not feeling well.  Here is how we plan to help!  For throat pain, we recommend over the counter oral pain relief medications such as  acetaminophen or aspirin, or anti-inflammatory medications such as ibuprofen or naproxen sodium.  Topical treatments such as oral throat lozenges or sprays may be used as needed.  If you have a sore or scratchy throat, use a saltwater gargle-  to  teaspoon of salt dissolved in a 4-ounce to 8-ounce glass of warm water.  Gargle the solution for approximately 15-30 seconds and then spit.  It is important not to swallow the solution.  .  Warm or cold liquids can also be helpful in relieving throat pain.  After careful review of your answers, I would not recommend an antibiotic for your condition.  Antibiotics should not be used to treat conditions that we suspect are caused by viruses like the virus that causes the common cold or flu. However, some people can have Strep with atypical symptoms. You may need formal testing in clinic or office to confirm if your symptoms continue or worsen.  For nasal congestion, you may use an oral decongestants such as Mucinex D or if you have glaucoma or high blood pressure use plain Mucinex.  Saline nasal spray or nasal drops can help and can safely be used as often as needed for congestion.  For your congestion, I have prescribed Fluticasone nasal spray one spray in each nostril twice a day  If you do not have a history of heart disease, hypertension, diabetes or thyroid disease, prostate/bladder issues or glaucoma, you may  also use Sudafed to treat nasal congestion.  It is highly recommended that you consult with a pharmacist or your primary care physician to ensure this medication is safe for you to take.     If you have a cough, you may use cough suppressants such as Delsym and Robitussin.  If you have glaucoma or high blood pressure, you can also use Coricidin HBP.    For headache, pain or general discomfort, you can use Ibuprofen or Tylenol as directed.   Some authorities believe that zinc sprays or the use of Echinacea may shorten the course of your  symptoms.   HOME CARE Only take medications as instructed by your medical team. Be sure to drink plenty of fluids. Water is fine as well as fruit juices, sodas and electrolyte beverages. You may want to stay away from caffeine or alcohol. If you are nauseated, try taking small sips of liquids. How do you know if you are getting enough fluid? Your urine should be a pale yellow or almost colorless. Get rest. Taking a steamy shower or using a humidifier may help nasal congestion and ease sore throat pain. You can place a towel over your head and breathe in the steam from hot water coming from a faucet. Using a saline nasal spray works much the same way. Cough drops, hard candies and sore throat lozenges may ease your cough. Avoid close contacts especially the very young and the elderly Cover your mouth if you cough or sneeze Always remember to wash your hands.   GET HELP RIGHT AWAY IF: You develop worsening fever. If your symptoms do not improve within 10 days You develop yellow or green discharge from your nose over 3 days. You have coughing fits You develop a severe head ache or visual changes. You develop shortness of breath, difficulty breathing or start having chest pain Your symptoms persist after you have completed your treatment plan  MAKE SURE YOU  Understand these instructions. Will watch your condition. Will get help right away if you are not doing well or get worse.  Thank you for choosing an e-visit.  Your e-visit answers were reviewed by a board certified advanced clinical practitioner to complete your personal care plan. Depending upon the condition, your plan could have included both over the counter or prescription medications.  Please review your pharmacy choice. Make sure the pharmacy is open so you can pick up prescription now. If there is a problem, you may contact your provider through [employer] and have the prescription routed to another pharmacy.  Your  safety is important to [place]. If you have drug allergies check your prescription carefully.   For the next 24 hours you can use MyChart to ask questions about today's visit, request a non-urgent call back, or ask for a work or school excuse. You will get an email in the next two days asking about your experience. I hope that your e-visit has been valuable and will speed your recovery.  I have spent 5 minutes in review of e-visit questionnaire, review and updating patient chart, medical decision making and response to patient.   Willeen Cass, PhD, FNP-BC

## 2021-09-23 ENCOUNTER — Ambulatory Visit
Admission: RE | Admit: 2021-09-23 | Discharge: 2021-09-23 | Disposition: A | Discharge status: Home / Self Care | Payer: 59 | Source: Ambulatory Visit | Attending: Emergency Medicine | Admitting: Emergency Medicine

## 2021-09-23 ENCOUNTER — Other Ambulatory Visit: Payer: Self-pay

## 2021-09-23 VITALS — BP 124/88 | HR 89 | Temp 97.9°F

## 2021-09-23 DIAGNOSIS — B349 Viral infection, unspecified: Secondary | ICD-10-CM

## 2021-09-23 LAB — POCT INFLUENZA A/B
Influenza A, POC: NEGATIVE
Influenza B, POC: NEGATIVE

## 2021-09-23 LAB — POCT RAPID STREP A (OFFICE): Rapid Strep A Screen: NEGATIVE

## 2021-09-23 NOTE — Discharge Instructions (Addendum)
Your strep and flu tests are negative.    Take Tylenol or ibuprofen as needed for fever or discomfort.  Rest and keep yourself hydrated.    Follow-up with your primary care provider if your symptoms are not improving.

## 2021-09-23 NOTE — ED Provider Notes (Signed)
[name]    CSN: [id number] Arrival date & time: 09/23/21  1147      History   Chief Complaint Chief Complaint  Patient presents with   Sore Throat    HPI Rachael Cowan is a 35 y.o. female.  Patient presents with 3 to 4-day history of congestion, sore throat, cough.  No fever, rash, wheezing, shortness of breath, vomiting, abdominal pain, or other symptoms.  Treatment at home with [place].  Her medical history includes asthma and rectal cancer.  The history is provided by the patient and medical records.   Past Medical History:  Diagnosis Date   Allergy    Asthma    Gallstones    Hypersomnolence disorder 2005   managed with metidate   Rectal cancer ([place]) 05/13/2012   Resection with colostomy and hysterectomy.   Sleep apnea 2005   resolved with ENT surgery,  [name]    Patient Active Problem List   Diagnosis Date Noted   Influenza A 08/07/2021   Facial rash 08/05/2020   Hypertriglyceridemia 08/05/2020   Vesicular rash 02/01/2020   Contact dermatitis and eczema due to plant 02/01/2020   Encounter for monitoring estrogen replacement therapy following surgical menopause 07/15/2019   Hyponatremia 01/30/2019   S/P colostomy ([place]) 01/30/2019   Educated about COVID-19 virus infection 01/30/2019   Abnormal breast exam 08/01/2018   Encounter for preventive health examination 06/17/2017   S/P cholecystectomy 09/10/2016   History of hematuria 07/19/2016   Hepatic steatosis 07/19/2016   B12 deficiency 06/23/2013   Obesity 06/23/2013   Other and unspecified hyperlipidemia 06/23/2013   Generalized abdominal cramping 06/01/2012   Menopause 05/13/2012   Airway hyperreactivity 05/07/2012   Hypersomnia 05/07/2012   Asthma 05/07/2012   History of rectal cancer 01/13/2012   Primary hypersomnolence disorder 11/12/2011    Past Surgical History:  Procedure Laterality Date   ABDOMINAL HYSTERECTOMY     ARTHROSCOPIC REPAIR ACL  2006   right knee Dr Donna Christen at  [place] 07/21/2016   Procedure: LAPAROSCOPIC CHOLECYSTECTOMY CONVERTED TO OPEN;  Surgeon: Olean Ree, MD;  Location: [facility] ORS;  Service: General;  Laterality: N/A;   COLON SURGERY     COLONOSCOPY WITH PROPOFOL N/A 12/14/2015   Procedure: COLONOSCOPY WITH PROPOFOL;  Surgeon: Manya Silvas, MD;  Location: [facility] ENDOSCOPY;  Service: Endoscopy;  Laterality: N/A;   Colostomy Placement     PORTA CATH REMOVAL N/A 11/06/2016   Procedure: Glori Luis Cath Removal;  Surgeon: Algernon Huxley, MD;  Location: [place] CV LAB;  Service: Cardiovascular;  Laterality: N/A;   TONSILLECTOMY AND ADENOIDECTOMY      OB History   No obstetric history on file.      Home Medications    Prior to Admission medications   Medication Sig Start Date End Date Taking? Authorizing Provider  amphetamine-dextroamphetamine (ADDERALL) 20 MG tablet Take 1 tablet (20 mg total) by mouth 2 (two) times daily. 08/07/21   [name], MD  amphetamine-dextroamphetamine (ADDERALL) 20 MG tablet Take 1 tablet (20 mg total) by mouth 2 (two) times daily. 08/07/21   [name], MD  amphetamine-dextroamphetamine (ADDERALL) 20 MG tablet Take 1 tablet (20 mg total) by mouth 2 (two) times daily. 08/07/21   [name], MD  benzonatate (TESSALON) 200 MG capsule Take 1 capsule (200 mg total) by mouth 2 (two) times daily as needed for cough. 08/06/21   Rodriguez-Southworth, Sunday Spillers, PA-C  dicyclomine (BENTYL) 10 MG capsule TAKE 1 CAPSULE BY MOUTH  2 TIMES DAILY. 08/15/21   [name], MD  estradiol (ESTRACE) 1 MG tablet TAKE 1 TABLET BY MOUTH EVERY DAY 01/04/21   [name], MD  fluticasone (FLONASE) 50 MCG/ACT nasal spray Place 2 sprays into both nostrils daily. 09/20/21   Carvel Getting, NP  loratadine (CLARITIN) 10 MG tablet Take 10 mg by mouth daily.    [provider]  metroNIDAZOLE (METROGEL) 0.75 % gel Apply 1 application topically 2 (two) times daily. To affected area until resolved Patient not  taking: Reported on 08/07/2021 08/03/20   [name], MD  oseltamivir (TAMIFLU) 75 MG capsule Take 1 capsule (75 mg total) by mouth 2 (two) times daily. 08/07/21   [name], MD  triamcinolone cream (KENALOG) 0.1 % APPLY TO AFFECTED AREA TWICE A DAY 02/09/20   [name], MD    Family History Family History  Problem Relation Age of Onset   Prostate cancer Father    Heart attack Maternal Grandfather    Hyperlipidemia Paternal Grandfather    Hypertension Paternal Grandfather    Skin cancer Maternal Grandmother     Social History Social History   Tobacco Use   Smoking status: Never   Smokeless tobacco: Never  Vaping Use   Vaping Use: Never used  Substance Use Topics   Alcohol use: Yes    Comment: ocassional   Drug use: No     Allergies   Patient has no known allergies.   Review of Systems Review of Systems  Constitutional:  Negative for chills and fever.  HENT:  Positive for congestion and sore throat. Negative for ear pain.   Respiratory:  Positive for cough. Negative for shortness of breath and wheezing.   Cardiovascular:  Negative for chest pain and palpitations.  Gastrointestinal:  Negative for abdominal pain and vomiting.  Musculoskeletal:  Negative for arthralgias and back pain.  Skin:  Negative for color change and rash.  All other systems reviewed and are negative.   Physical Exam Triage Vital Signs ED Triage Vitals [09/23/21 1205]  Enc Vitals Group     BP 124/88     Pulse Rate 89     Resp      Temp 97.9 F (36.6 C)     Temp Source Oral     SpO2 98 %     Weight      Height      Head Circumference      Peak Flow      Pain Score      Pain Loc      Pain Edu?      Excl. in [place]?    No data found.  Updated Vital Signs BP 124/88 (BP Location: Left Arm)    Pulse 89    Temp 97.9 F (36.6 C) (Oral)    LMP 10/25/2011    SpO2 98%   Visual Acuity Right Eye Distance:   Left Eye Distance:   Bilateral Distance:    Right Eye Near:   Left  Eye Near:    Bilateral Near:     Physical Exam Vitals and nursing note reviewed.  Constitutional:      General: She is not in acute distress.    Appearance: She is well-developed. She is not ill-appearing.  HENT:     Right Ear: Tympanic membrane normal.     Left Ear: Tympanic membrane normal.     Nose: Nose normal.     Mouth/Throat:     Mouth: Mucous  membranes are moist.     Pharynx: Posterior oropharyngeal erythema present.  Cardiovascular:     Rate and Rhythm: Normal rate and regular rhythm.     Heart sounds: Normal heart sounds.  Pulmonary:     Effort: Pulmonary effort is normal. No respiratory distress.     Breath sounds: Normal breath sounds.  Musculoskeletal:     Cervical back: Neck supple.  Skin:    General: Skin is warm and dry.  Neurological:     Mental Status: She is alert.  Psychiatric:        Mood and Affect: Mood normal.        Behavior: Behavior normal.     UC Treatments / Results  Labs (all labs ordered are listed, but only abnormal results are displayed) Labs Reviewed  POCT INFLUENZA A/B  POCT RAPID STREP A (OFFICE)    EKG   Radiology No results found.  Procedures Procedures (including critical care time)  Medications Ordered in UC Medications - No data to display  Initial Impression / Assessment and Plan / UC Course  I have reviewed the triage vital signs and the nursing notes.  Pertinent labs & imaging results that were available during my care of the patient were reviewed by me and considered in my medical decision making (see chart for details).   Viral illness.  Rapid strep negative. Rapid Flu negative.  Patient declines COVID test; negative at home test.  Discussed symptomatic treatment including Tylenol, rest, hydration.  Instructed patient to follow-up with her PCP if her symptoms are not improving.  She agrees to plan of care.   Final Clinical Impressions(s) / UC Diagnoses   Final diagnoses:  Viral illness     Discharge  Instructions      Your strep and flu tests are negative.    Take Tylenol or ibuprofen as needed for fever or discomfort.  Rest and keep yourself hydrated.    Follow-up with your primary care provider if your symptoms are not improving.         ED Prescriptions   None    PDMP not reviewed this encounter.   Sharion Balloon, NP 09/23/21 1301

## 2021-09-23 NOTE — ED Triage Notes (Signed)
Pt c/o mild ST, head and chest congestions x 3 days

## 2021-10-11 ENCOUNTER — Other Ambulatory Visit: Payer: Self-pay | Admitting: Internal Medicine

## 2021-10-31 ENCOUNTER — Other Ambulatory Visit: Payer: Self-pay | Admitting: *Deleted

## 2021-10-31 DIAGNOSIS — Z85048 Personal history of other malignant neoplasm of rectum, rectosigmoid junction, and anus: Secondary | ICD-10-CM

## 2021-11-07 ENCOUNTER — Encounter: Payer: Self-pay | Admitting: Oncology

## 2021-11-07 ENCOUNTER — Other Ambulatory Visit: Payer: Self-pay

## 2021-11-07 ENCOUNTER — Ambulatory Visit: Payer: 59 | Admitting: Hematology and Oncology

## 2021-11-07 ENCOUNTER — Inpatient Hospital Stay: Payer: 59

## 2021-11-07 ENCOUNTER — Inpatient Hospital Stay: Payer: 59 | Attending: Oncology | Admitting: Oncology

## 2021-11-07 ENCOUNTER — Other Ambulatory Visit: Payer: 59

## 2021-11-07 VITALS — BP 121/82 | HR 80 | Temp 96.1°F | Resp 20 | Wt 206.5 lb

## 2021-11-07 DIAGNOSIS — Z85048 Personal history of other malignant neoplasm of rectum, rectosigmoid junction, and anus: Secondary | ICD-10-CM | POA: Diagnosis not present

## 2021-11-07 DIAGNOSIS — R7989 Other specified abnormal findings of blood chemistry: Secondary | ICD-10-CM | POA: Diagnosis not present

## 2021-11-07 DIAGNOSIS — Z79899 Other long term (current) drug therapy: Secondary | ICD-10-CM | POA: Diagnosis not present

## 2021-11-07 LAB — COMPREHENSIVE METABOLIC PANEL
ALT: 27 U/L (ref 0–44)
AST: 17 U/L (ref 15–41)
Albumin: 3.8 g/dL (ref 3.5–5.0)
Alkaline Phosphatase: 72 U/L (ref 38–126)
Anion gap: 7 (ref 5–15)
BUN: 13 mg/dL (ref 6–20)
CO2: 27 mmol/L (ref 22–32)
Calcium: 8.8 mg/dL — ABNORMAL LOW (ref 8.9–10.3)
Chloride: 103 mmol/L (ref 98–111)
Creatinine, Ser: 1.12 mg/dL — ABNORMAL HIGH (ref 0.44–1.00)
GFR, Estimated: >60 mL/min (ref >60)
Glucose, Bld: 116 mg/dL — ABNORMAL HIGH (ref 70–99)
Potassium: 4 mmol/L (ref 3.5–5.1)
Sodium: 137 mmol/L (ref 135–145)
Total Bilirubin: 0.5 mg/dL (ref 0.3–1.2)
Total Protein: 7.1 g/dL (ref 6.5–8.1)

## 2021-11-07 LAB — CBC WITH DIFFERENTIAL/PLATELET
Abs Immature Granulocytes: 0.06 10*3/uL (ref 0.00–0.07)
Basophils Absolute: 0.1 10*3/uL (ref 0.0–0.1)
Basophils Relative: 1 %
Eosinophils Absolute: 0.1 10*3/uL (ref 0.0–0.5)
Eosinophils Relative: 1 %
HCT: 38.4 % (ref 36.0–46.0)
Hemoglobin: 13 g/dL (ref 12.0–15.0)
Immature Granulocytes: 1 %
Lymphocytes Relative: 34 %
Lymphs Abs: 2 10*3/uL (ref 0.7–4.0)
MCH: 30.2 pg (ref 26.0–34.0)
MCHC: 33.9 g/dL (ref 30.0–36.0)
MCV: 89.3 fL (ref 80.0–100.0)
Monocytes Absolute: 0.3 10*3/uL (ref 0.1–1.0)
Monocytes Relative: 6 %
Neutro Abs: 3.3 10*3/uL (ref 1.7–7.7)
Neutrophils Relative %: 57 %
Platelets: 202 10*3/uL (ref 150–400)
RBC: 4.3 MIL/uL (ref 3.87–5.11)
RDW: 13 % (ref 11.5–15.5)
WBC: 5.7 10*3/uL (ref 4.0–10.5)
nRBC: 0 % (ref 0.0–0.2)

## 2021-11-07 NOTE — Progress Notes (Signed)
Patient states she had the flu back in November 2022. Patient states she had swollen lymph nodes in the neck and head a day before she got the flu symptoms.

## 2021-11-07 NOTE — Progress Notes (Signed)
Hematology/Oncology Progress note [phone] [phone]      Clinic Day:  11/07/2021  Referring physician: [name], MD  Chief Complaint: Rachael Cowan is a 36 y.o. female with a history of stage IIIB rectal cancer presents for follow up   [place] Patient previously followed up by Dr.Corcoran, patient switched care to me on 11/07/21 Extensive medical record review was performed by me  # History of stage IIIB (T4N1M0) rectal cancer s/p neoadjuvant radiation and 5FU (completed on 03/07/2012) followed by rectosigmoidectomy, hysterectomy and oophorectomy on 05/13/2012 at [facility]  Pathologic stage was [id number].  She began post-operative FOLFOX chemotherapy in 05/2012.  She received 11 cycles (completed in 12/2012) secondary to neutropenia. She has a colostomy Patient has surveillance imaging study until 2018.  Colonoscopy on 12/14/2015 revealed one diminutive polyp in the sigmoid colon.  Pathology revealed a tubular adenoma with no evidence of dysplasia or malignancy.  Nodules at the top of the colostomy revealed inflamed and ulcerated colocutaneous tissue.   She has no family history of colon cancer.  My Risk genetic testing was negative per patient report.   INTERVAL HISTORY Rachael Cowan is a 36 y.o. female who has above history reviewed by me today presents for follow up visit history of stage IIIb rectal cancer  Patient reports feeling well.  She had upper respiratory infection at the end of last year and has felt enlarged lymph node in her neck and posterior head.  Lymph node swelling has gradually improved. Denies unintentional weight loss, nausea vomiting, abdominal pain.  Denies any problem with colostomy.  She has tried to lose weight with healthy food choices..  She lost 3 pounds compared to November 2022. Patient was seen by [facility] gastroenterology on 11/17/2017, last colonoscopy in our EMR was in 2017.  Past Medical History:   Diagnosis Date   Allergy    Asthma    Gallstones    Hypersomnolence disorder 2005   managed with metidate   Rectal cancer ([place]) 05/13/2012   Resection with colostomy and hysterectomy.   Sleep apnea 2005   resolved with ENT surgery,  [name]    Past Surgical History:  Procedure Laterality Date   ABDOMINAL HYSTERECTOMY     ARTHROSCOPIC REPAIR ACL  2006   right knee Dr Rachael Cowan at [place] 07/21/2016   Procedure: LAPAROSCOPIC CHOLECYSTECTOMY CONVERTED TO OPEN;  Surgeon: Rachael Ree, MD;  Location: [facility] ORS;  Service: General;  Laterality: N/A;   COLON SURGERY     COLONOSCOPY WITH PROPOFOL N/A 12/14/2015   Procedure: COLONOSCOPY WITH PROPOFOL;  Surgeon: Rachael Silvas, MD;  Location: [facility] ENDOSCOPY;  Service: Endoscopy;  Laterality: N/A;   Colostomy Placement     PORTA CATH REMOVAL N/A 11/06/2016   Procedure: [facility] Cath Removal;  Surgeon: Rachael Huxley, MD;  Location: [facility];  Service: Cardiovascular;  Laterality: N/A;   TONSILLECTOMY AND ADENOIDECTOMY      Family History  Problem Relation Age of Onset   Prostate cancer Father    Heart attack Maternal Grandfather    Hyperlipidemia Paternal Grandfather    Hypertension Paternal Grandfather    Skin cancer Maternal Grandmother     Social History:  reports that she has never smoked. She has never used smokeless tobacco. She reports current alcohol use. She reports that she does not use drugs. She lives in [place], [place], but plans on moving back to [place] where her mother and rest of her family  lives.  She is currently in grad school to become a couselor. The patient is alone today.  Allergies: No Known Allergies  Current Medications: Current Outpatient Medications  Medication Sig Dispense Refill   amphetamine-dextroamphetamine (ADDERALL) 20 MG tablet Take 1 tablet (20 mg total) by mouth 2 (two) times daily. 60 tablet 0   dicyclomine (BENTYL) 10 MG capsule TAKE 1 CAPSULE BY MOUTH 2 TIMES  DAILY. 180 capsule 1   estradiol (ESTRACE) 1 MG tablet TAKE 1 TABLET BY MOUTH EVERY DAY 90 tablet 2   loratadine (CLARITIN) 10 MG tablet Take 10 mg by mouth daily.     triamcinolone cream (KENALOG) 0.1 % APPLY TO AFFECTED AREA TWICE A DAY 30 g 0   amphetamine-dextroamphetamine (ADDERALL) 20 MG tablet Take 1 tablet (20 mg total) by mouth 2 (two) times daily. (Patient not taking: Reported on 11/07/2021) 60 tablet 0   amphetamine-dextroamphetamine (ADDERALL) 20 MG tablet Take 1 tablet (20 mg total) by mouth 2 (two) times daily. (Patient not taking: Reported on 11/07/2021) 60 tablet 0   benzonatate (TESSALON) 200 MG capsule Take 1 capsule (200 mg total) by mouth 2 (two) times daily as needed for cough. (Patient not taking: Reported on 11/07/2021) 30 capsule 0   fluticasone (FLONASE) 50 MCG/ACT nasal spray Place 2 sprays into both nostrils daily. 16 g 0   metroNIDAZOLE (METROGEL) 0.75 % gel Apply 1 application topically 2 (two) times daily. To affected area until resolved (Patient not taking: Reported on 08/07/2021) 45 g 0   oseltamivir (TAMIFLU) 75 MG capsule Take 1 capsule (75 mg total) by mouth 2 (two) times daily. (Patient not taking: Reported on 11/07/2021) 10 capsule 0   No current facility-administered medications for this visit.    Review of Systems  Constitutional:  Negative for chills, diaphoresis, fever, malaise/fatigue and weight loss.  HENT:  Negative for congestion, ear discharge, ear pain, hearing loss, nosebleeds, sinus pain, sore throat and tinnitus.        Cervical and posterior occipital lymphadenopathy have resolved.   Eyes:  Negative for blurred vision.  Respiratory:  Negative for cough, hemoptysis, sputum production and shortness of breath.   Cardiovascular:  Negative for chest pain, palpitations and leg swelling.  Gastrointestinal:  Negative for abdominal pain, blood in stool, constipation, diarrhea, heartburn, melena, nausea and vomiting.  Genitourinary:  Negative for dysuria,  frequency, hematuria and urgency.       Incontinence  Musculoskeletal:  Negative for back pain, joint pain, myalgias and neck pain.  Skin:  Negative for itching and rash.  Neurological:  Negative for dizziness, tingling, sensory change, weakness and headaches.  Endo/Heme/Allergies:  Does not bruise/bleed easily.  Psychiatric/Behavioral:  Negative for depression and memory loss. The patient is not nervous/anxious and does not have insomnia.   All other systems reviewed and are negative.  Performance status (ECOG): 0  Vitals Blood pressure 121/82, pulse 80, temperature (!) 96.1 F (35.6 C), resp. rate 20, weight 206 lb 8 oz (93.7 kg), last menstrual period 10/25/2011, SpO2 100 %.   Physical Exam Vitals and nursing note reviewed.  Constitutional:      General: She is not in acute distress.    Appearance: She is not diaphoretic.  HENT:     Head: Normocephalic and atraumatic.  Eyes:     General: No scleral icterus.    Pupils: Pupils are equal, round, and reactive to light.  Cardiovascular:     Rate and Rhythm: Normal rate and regular rhythm.     Heart sounds:  Normal heart sounds. No murmur heard. Pulmonary:     Effort: Pulmonary effort is normal. No respiratory distress.     Breath sounds: Normal breath sounds. No wheezing.  Abdominal:     General: Bowel sounds are normal. There is no distension.     Palpations: Abdomen is soft. There is no mass.     Tenderness: There is no abdominal tenderness. There is no guarding or rebound.     Comments: Colostomy  Musculoskeletal:        General: No swelling or tenderness. Normal range of motion.     Cervical back: Normal range of motion and neck supple.  Lymphadenopathy:     Head:     Right side of head: No preauricular, posterior auricular or occipital adenopathy.     Left side of head: No preauricular, posterior auricular or occipital adenopathy.     Cervical: No cervical adenopathy.     Upper Body:     Right upper body: No  supraclavicular or axillary adenopathy.     Left upper body: No supraclavicular or axillary adenopathy.     Lower Body: No right inguinal adenopathy. No left inguinal adenopathy.  Skin:    General: Skin is warm and dry.  Neurological:     Mental Status: She is alert and oriented to person, place, and time.  Psychiatric:        Mood and Affect: Mood normal.   Appointment on 11/07/2021  Component Date Value Ref Range Status   Sodium 11/07/2021 137  135 - 145 mmol/L Final   Potassium 11/07/2021 4.0  3.5 - 5.1 mmol/L Final   Chloride 11/07/2021 103  98 - 111 mmol/L Final   CO2 11/07/2021 27  22 - 32 mmol/L Final   Glucose, Bld 11/07/2021 116 (H)  70 - 99 mg/dL Final   Glucose reference range applies only to samples taken after fasting for at least 8 hours.   BUN 11/07/2021 13  6 - 20 mg/dL Final   Creatinine, Ser 11/07/2021 1.12 (H)  0.44 - 1.00 mg/dL Final   Calcium 11/07/2021 8.8 (L)  8.9 - 10.3 mg/dL Final   Total Protein 11/07/2021 7.1  6.5 - 8.1 g/dL Final   Albumin 11/07/2021 3.8  3.5 - 5.0 g/dL Final   AST 11/07/2021 17  15 - 41 U/L Final   ALT 11/07/2021 27  0 - 44 U/L Final   Alkaline Phosphatase 11/07/2021 72  38 - 126 U/L Final   Total Bilirubin 11/07/2021 0.5  0.3 - 1.2 mg/dL Final   GFR, Estimated 11/07/2021 >60  >60 mL/min Final   Comment: (NOTE) Calculated using the CKD-EPI Creatinine Equation (2021)    Anion gap 11/07/2021 7  5 - 15 Final   Performed at [facility], [place], [place] [address]   WBC 11/07/2021 5.7  4.0 - 10.5 K/uL Final   RBC 11/07/2021 4.30  3.87 - 5.11 MIL/uL Final   Hemoglobin 11/07/2021 13.0  12.0 - 15.0 g/dL Final   HCT 11/07/2021 38.4  36.0 - 46.0 % Final   MCV 11/07/2021 89.3  80.0 - 100.0 fL Final   MCH 11/07/2021 30.2  26.0 - 34.0 pg Final   MCHC 11/07/2021 33.9  30.0 - 36.0 g/dL Final   RDW 11/07/2021 13.0  11.5 - 15.5 % Final   Platelets 11/07/2021 202  150 - 400 K/uL Final   nRBC 11/07/2021 0.0  0.0 - 0.2 %  Final   Neutrophils Relative % 11/07/2021 57  %  Final   Neutro Abs 11/07/2021 3.3  1.7 - 7.7 K/uL Final   Lymphocytes Relative 11/07/2021 34  % Final   Lymphs Abs 11/07/2021 2.0  0.7 - 4.0 K/uL Final   Monocytes Relative 11/07/2021 6  % Final   Monocytes Absolute 11/07/2021 0.3  0.1 - 1.0 K/uL Final   Eosinophils Relative 11/07/2021 1  % Final   Eosinophils Absolute 11/07/2021 0.1  0.0 - 0.5 K/uL Final   Basophils Relative 11/07/2021 1  % Final   Basophils Absolute 11/07/2021 0.1  0.0 - 0.1 K/uL Final   Immature Granulocytes 11/07/2021 1  % Final   Abs Immature Granulocytes 11/07/2021 0.06  0.00 - 0.07 K/uL Final   Performed at [facility], [address] [place], [place] [address]    Assessment/plan 1. History of rectal cancer   2. Elevated serum creatinine    # Stage IIIB rectal cancer Clinically patient doing well.  No constitutional symptoms. Labs reviewed and discussed with patient. CEA is pending.  Images will be obtained as clinically indicated. Recommend patient continue follow-up with gastroenterology.  #Elevated creatinine, encourage patient to increase oral hydration.  We will repeat BMP in 2 weeks.  RTC in 1 year for MD assess and labs (CBC with diff, CMP, CEA).  I discussed the assessment and treatment plan with the patient.  The patient was provided an opportunity to ask questions and all were answered.  The patient agreed with the plan and demonstrated an understanding of the instructions.  The patient was advised to call back if the symptoms worsen or if the condition fails to improve as anticipated.  Earlie Server, MD, PhD [facility] Hematology Oncology 11/07/2021

## 2021-11-08 ENCOUNTER — Encounter: Payer: Self-pay | Admitting: Internal Medicine

## 2021-11-08 LAB — CEA: CEA: 1.1 ng/mL (ref 0.0–4.7)

## 2021-11-13 NOTE — Telephone Encounter (Signed)
Rachael Cowan from [facility] has an order for medical supplies and want to know if the provider can sign the order Fax number [phone]

## 2021-11-15 NOTE — Telephone Encounter (Signed)
The order was faxed yesterday afternoon.

## 2021-11-16 DIAGNOSIS — Z933 Colostomy status: Secondary | ICD-10-CM | POA: Diagnosis not present

## 2021-11-19 DIAGNOSIS — Z933 Colostomy status: Secondary | ICD-10-CM | POA: Diagnosis not present

## 2021-11-20 ENCOUNTER — Other Ambulatory Visit: Payer: Self-pay | Admitting: Internal Medicine

## 2021-11-20 MED ORDER — AMPHETAMINE-DEXTROAMPHETAMINE 20 MG PO TABS: 20.0000 mg | ORAL_TABLET | Freq: Two times a day (BID) | ORAL | 0 refills | Status: DC

## 2021-11-20 NOTE — Telephone Encounter (Signed)
Refilled for 3 months.  Please remind patient to schedule 6 month follow up in may

## 2021-11-21 ENCOUNTER — Inpatient Hospital Stay: Payer: 59

## 2021-11-21 ENCOUNTER — Other Ambulatory Visit: Payer: Self-pay

## 2021-11-21 DIAGNOSIS — Z85048 Personal history of other malignant neoplasm of rectum, rectosigmoid junction, and anus: Secondary | ICD-10-CM

## 2021-11-21 DIAGNOSIS — R7989 Other specified abnormal findings of blood chemistry: Secondary | ICD-10-CM | POA: Diagnosis not present

## 2021-11-21 DIAGNOSIS — Z79899 Other long term (current) drug therapy: Secondary | ICD-10-CM | POA: Diagnosis not present

## 2021-11-21 LAB — BASIC METABOLIC PANEL
Anion gap: 6 (ref 5–15)
BUN: 16 mg/dL (ref 6–20)
CO2: 25 mmol/L (ref 22–32)
Calcium: 8.9 mg/dL (ref 8.9–10.3)
Chloride: 102 mmol/L (ref 98–111)
Creatinine, Ser: 1.04 mg/dL — ABNORMAL HIGH (ref 0.44–1.00)
GFR, Estimated: >60 mL/min (ref >60)
Glucose, Bld: 102 mg/dL — ABNORMAL HIGH (ref 70–99)
Potassium: 3.6 mmol/L (ref 3.5–5.1)
Sodium: 133 mmol/L — ABNORMAL LOW (ref 135–145)

## 2021-12-11 ENCOUNTER — Other Ambulatory Visit: Payer: Self-pay

## 2021-12-11 ENCOUNTER — Ambulatory Visit
Admission: RE | Admit: 2021-12-11 | Discharge: 2021-12-11 | Disposition: A | Discharge status: Home / Self Care | Payer: 59 | Source: Ambulatory Visit | Attending: Emergency Medicine | Admitting: Emergency Medicine

## 2021-12-11 VITALS — BP 108/75 | HR 107 | Temp 98.2°F | Resp 18

## 2021-12-11 DIAGNOSIS — J029 Acute pharyngitis, unspecified: Secondary | ICD-10-CM | POA: Diagnosis not present

## 2021-12-11 DIAGNOSIS — B349 Viral infection, unspecified: Secondary | ICD-10-CM

## 2021-12-11 LAB — POCT RAPID STREP A (OFFICE): Rapid Strep A Screen: NEGATIVE

## 2021-12-11 NOTE — ED Provider Notes (Signed)
?[facility] ? ? ? ?CSN: [id number] ?Arrival date & time: 12/11/21  0815 ? ? ?  ? ?History   ?Chief Complaint ?Chief Complaint  ?Patient presents with  ? Sore Throat  ? ? ?HPI ?Rachael Cowan is a 36 y.o. female.  Patient presents with sore throat x2 days.  She also reports congestion and cough.  No fever, rash, shortness of breath, vomiting, diarrhea, or other symptoms.  Treatment at home with Mucinex DM and Claritin.  Her medical history includes asthma, rectal cancer, colostomy. ? ?The history is provided by the patient and medical records.  ? ?Past Medical History:  ?Diagnosis Date  ? Allergy   ? Asthma   ? Gallstones   ? Hypersomnolence disorder 2005  ? managed with metidate  ? Rectal cancer ([place]) 05/13/2012  ? Resection with colostomy and hysterectomy.  ? Sleep apnea 2005  ? resolved with ENT surgery,  [name]  ? ? ?Patient Active Problem List  ? Diagnosis Date Noted  ? Influenza A 08/07/2021  ? Facial rash 08/05/2020  ? Hypertriglyceridemia 08/05/2020  ? Vesicular rash 02/01/2020  ? Contact dermatitis and eczema due to plant 02/01/2020  ? Encounter for monitoring estrogen replacement therapy following surgical menopause 07/15/2019  ? Hyponatremia 01/30/2019  ? S/P colostomy ([place]) 01/30/2019  ? Educated about COVID-19 virus infection 01/30/2019  ? Abnormal breast exam 08/01/2018  ? Encounter for preventive health examination 06/17/2017  ? S/P cholecystectomy 09/10/2016  ? History of hematuria 07/19/2016  ? Hepatic steatosis 07/19/2016  ? B12 deficiency 06/23/2013  ? Obesity 06/23/2013  ? Other and unspecified hyperlipidemia 06/23/2013  ? Generalized abdominal cramping 06/01/2012  ? Menopause 05/13/2012  ? Airway hyperreactivity 05/07/2012  ? Hypersomnia 05/07/2012  ? Asthma 05/07/2012  ? History of rectal cancer 01/13/2012  ? Primary hypersomnolence disorder 11/12/2011  ? ? ?Past Surgical History:  ?Procedure Laterality Date  ? ABDOMINAL HYSTERECTOMY    ? ARTHROSCOPIC REPAIR ACL  2006  ? right knee  Dr Donna Christen at [facility]  ? CHOLECYSTECTOMY N/A 07/21/2016  ? Procedure: LAPAROSCOPIC CHOLECYSTECTOMY CONVERTED TO OPEN;  Surgeon: Ole[place] Ree, MD;  Location: [facility] ORS;  Service: General;  Laterality: N/A;  ? COLON SURGERY    ? COLONOSCOPY WITH PROPOFOL N/A 12/14/2015  ? Procedure: COLONOSCOPY WITH PROPOFOL;  Surgeon: Manya Silvas, MD;  Location: [facility] ENDOSCOPY;  Service: Endoscopy;  Laterality: N/A;  ? Colostomy Placement    ? PORTA CATH REMOVAL N/A 11/06/2016  ? Procedure: Porta Cath Removal;  Surgeon: Algernon Huxley, MD;  Location: [facility];  Service: Cardiovascular;  Laterality: N/A;  ? TONSILLECTOMY AND ADENOIDECTOMY    ? ? ?OB History   ?No obstetric history on file. ?  ? ? ? ?Home Medications   ? ?Prior to Admission medications   ?Medication Sig Start Date End Date Taking? Authorizing Provider  ?amphetamine-dextroamphetamine (ADDERALL) 20 MG tablet Take 1 tablet (20 mg total) by mouth 2 (two) times daily. 11/20/21  Yes [name], MD  ?dicyclomine (BENTYL) 10 MG capsule TAKE 1 CAPSULE BY MOUTH 2 TIMES DAILY. 08/15/21  Yes [name], MD  ?estradiol (ESTRACE) 1 MG tablet TAKE 1 TABLET BY MOUTH EVERY DAY 10/11/21  Yes [name], MD  ?loratadine (CLARITIN) 10 MG tablet Take 10 mg by mouth daily.   Yes [provider]  ?amphetamine-dextroamphetamine (ADDERALL) 20 MG tablet Take 1 tablet (20 mg total) by mouth 2 (two) times daily. 11/20/21   [name], MD  ?amphetamine-dextroamphetamine (  ADDERALL) 20 MG tablet Take 1 tablet (20 mg total) by mouth 2 (two) times daily. 11/20/21   [name], MD  ?fluticasone (FLONASE) 50 MCG/ACT nasal spray Place 2 sprays into both nostrils daily. 09/20/21   Carvel Getting, NP  ?triamcinolone cream (KENALOG) 0.1 % APPLY TO AFFECTED AREA TWICE A DAY 02/09/20   [name], MD  ? ? ?Family History ?Family History  ?Problem Relation Age of Onset  ? Prostate cancer Father   ? Heart attack Maternal Grandfather   ? Hyperlipidemia Paternal  Grandfather   ? Hypertension Paternal Grandfather   ? Skin cancer Maternal Grandmother   ? ? ?Social History ?Social History  ? ?Tobacco Use  ? Smoking status: Never  ? Smokeless tobacco: Never  ?Vaping Use  ? Vaping Use: Never used  ?Substance Use Topics  ? Alcohol use: Yes  ?  Comment: ocassional  ? Drug use: No  ? ? ? ?Allergies   ?Patient has no known allergies. ? ? ?Review of Systems ?Review of Systems  ?Constitutional:  Negative for chills and fever.  ?HENT:  Positive for congestion and sore throat. Negative for ear pain.   ?Respiratory:  Positive for cough. Negative for shortness of breath.   ?Cardiovascular:  Negative for chest pain and palpitations.  ?Gastrointestinal:  Negative for diarrhea and vomiting.  ?Musculoskeletal:  Negative for arthralgias and joint swelling.  ?Skin:  Negative for color change and rash.  ?All other systems reviewed and are negative. ? ? ?Physical Exam ?Triage Vital Signs ?ED Triage Vitals [12/11/21 0820]  ?Enc Vitals Group  ?   BP 108/75  ?   Pulse Rate (!) 107  ?   Resp 18  ?   Temp 98.2 ?F (36.8 ?C)  ?   Temp src   ?   SpO2 98 %  ?   Weight   ?   Height   ?   Head Circumference   ?   Peak Flow   ?   Pain Score   ?   Pain Loc   ?   Pain Edu?   ?   Excl. in [place]?   ? ?No data found. ? ?Updated Vital Signs ?BP 108/75   Pulse (!) 107   Temp 98.2 ?F (36.8 ?C)   Resp 18   LMP 10/25/2011   SpO2 98%  ? ?Visual Acuity ?Right Eye Distance:   ?Left Eye Distance:   ?Bilateral Distance:   ? ?Right Eye Near:   ?Left Eye Near:    ?Bilateral Near:    ? ?Physical Exam ?Vitals and nursing note reviewed.  ?Constitutional:   ?   General: She is not in acute distress. ?   Appearance: She is well-developed. She is obese. She is not ill-appearing.  ?HENT:  ?   Right Ear: Tympanic membrane normal.  ?   Left Ear: Tympanic membrane normal.  ?   Nose: Nose normal.  ?   Mouth/Throat:  ?   Mouth: Mucous membranes are moist.  ?   Pharynx: Posterior oropharyngeal erythema present.  ?Cardiovascular:  ?    Rate and Rhythm: Normal rate and regular rhythm.  ?   Heart sounds: Normal heart sounds.  ?Pulmonary:  ?   Effort: Pulmonary effort is normal. No respiratory distress.  ?   Breath sounds: Normal breath sounds.  ?Musculoskeletal:  ?   Cervical back: Neck supple.  ?Skin: ?   General: Skin is warm and dry.  ?Neurological:  ?  Mental Status: She is alert.  ?Psychiatric:     ?   Mood and Affect: Mood normal.     ?   Behavior: Behavior normal.  ? ? ? ?UC Treatments / Results  ?Labs ?(all labs ordered are listed, but only abnormal results are displayed) ?Labs Reviewed  ?POCT RAPID STREP A (OFFICE)  ? ? ?EKG ? ? ?Radiology ?No results found. ? ?Procedures ?Procedures (including critical care time) ? ?Medications Ordered in UC ?Medications - No data to display ? ?Initial Impression / Assessment and Plan / UC Course  ?I have reviewed the triage vital signs and the nursing notes. ? ?Pertinent labs & imaging results that were available during my care of the patient were reviewed by me and considered in my medical decision making (see chart for details). ? ? Viral pharyngitis, viral illness.  Rapid strep negative. Patient declines COVID or Flu testing.  Discussed symptomatic care.  Instructed patient to follow up with her PCP if her symptoms are not improving.  She agrees to plan of care.  ? ? ? ?Final Clinical Impressions(s) / UC Diagnoses  ? ?Final diagnoses:  ?Viral pharyngitis  ?Viral illness  ? ? ? ?Discharge Instructions   ? ?  ?Your strep test is negative.  Follow up with your primary care provider if your symptoms are not improving.   ? ? ? ? ? ?ED Prescriptions   ?None ?  ? ?PDMP not reviewed this encounter. ?  ?Sharion Balloon, NP ?12/11/21 [phone] ? ?

## 2021-12-11 NOTE — Discharge Instructions (Addendum)
Your strep test is negative.  Follow up with your primary care provider if your symptoms are not improving.    

## 2021-12-11 NOTE — ED Triage Notes (Signed)
Pt presents with ST x 2 days.  °

## 2021-12-16 DIAGNOSIS — Z933 Colostomy status: Secondary | ICD-10-CM | POA: Diagnosis not present

## 2021-12-17 ENCOUNTER — Telehealth (INDEPENDENT_AMBULATORY_CARE_PROVIDER_SITE_OTHER): Payer: 59 | Admitting: Internal Medicine

## 2021-12-17 ENCOUNTER — Encounter: Payer: Self-pay | Admitting: Internal Medicine

## 2021-12-17 DIAGNOSIS — J0110 Acute frontal sinusitis, unspecified: Secondary | ICD-10-CM | POA: Diagnosis not present

## 2021-12-17 MED ORDER — PREDNISONE 10 MG PO TABS: ORAL_TABLET | ORAL | 0 refills | Status: DC

## 2021-12-17 MED ORDER — AMOXICILLIN-POT CLAVULANATE 875-125 MG PO TABS: 1.0000 | ORAL_TABLET | Freq: Two times a day (BID) | ORAL | 0 refills | Status: DC

## 2021-12-17 NOTE — Patient Instructions (Signed)
?  I am treating you for sinusitis  which is a complication from your viral infection or your allergies  due to  persistent sinus congestion. ? ? I am prescribing an antibiotic (augmentin ) and a prednisone taper  To manage the infection and the inflammation in your ear/sinuses.  ? ?I also advise use of the following OTC meds to help with your other symptoms.  ? ?I recommend that you Switch from claritin to allegra or zyrtec as your antihistamine ? ?Pseudoephedrine and phenylephrine are both oral decongestants. They can cause increased pulse,  increased blood pressure and insomnia IN SOME PEOPLE.   You can buy them separately or in combination with antihistamine or as part of a "cold and sinus " syrup or tablet. ? ?you may substitute Afrin nasal spray if you do not tolerate oral decongestants.  It is a 12 hour nasal spray.  You should not use for more than 5 days in a row  (or you will get rebound congestion when you stop using it).  You can use it once daily at night for a longer period of time  instead of the oral decongestants  If needed to prevent insomnia. ? ?Guaifenesin is a "MUCOLYTIC"  (THINS OUT THE SNOT) ?Dextromethorphan is  COUGH SUPPRESSANT  ? ?Gargle with salt water as needed for sore throat ? ? ?

## 2021-12-17 NOTE — Progress Notes (Signed)
Virtual Visit via Caregility  Note ? ?This visit type was conducted due to national recommendations for restrictions regarding the COVID-19 pandemic (e.g. social distancing).  This format is felt to be most appropriate for this patient at this time.  All issues noted in this document were discussed and addressed.  No physical exam was performed (except for noted visual exam findings with Video Visits).  ? ?I connected withNAME@ on 12/17/21 at 10:30 AM EDT by a video enabled telemedicine application  and verified that I am speaking with the correct person using two identifiers. ?Location patient: home ?Location provider: work or home office ?Persons participating in the virtual visit: patient, provider ? ?I discussed the limitations, risks, security and privacy concerns of performing an evaluation and management service by telephone and the availability of in person appointments. I also discussed with the patient that there may be a patient responsible charge related to this service. The patient expressed understanding and agreed to proceed. ? ?Reason for visit: sinusitis  ? ?HPI: ? ?36 yr old female with history of partial colectomy secondary to history of rectal CA presents with 14 day history of sinus congestion, sore throat and headache.  COVID NEGATIVE X 2,  STREP NEGATIVE   on March 15 by Urgent care.  Symptoms have persisted.  ? ? ?ROS: See pertinent positives and negatives per HPI. ? ?Past Medical History:  ?Diagnosis Date  ? Allergy   ? Asthma   ? Gallstones   ? Hypersomnolence disorder 2005  ? managed with metidate  ? Rectal cancer ([place]) 05/13/2012  ? Resection with colostomy and hysterectomy.  ? Sleep apnea 2005  ? resolved with ENT surgery,  [name]  ? ? ?Past Surgical History:  ?Procedure Laterality Date  ? ABDOMINAL HYSTERECTOMY    ? ARTHROSCOPIC REPAIR ACL  2006  ? right knee Dr Donna Christen at [facility]  ? CHOLECYSTECTOMY N/A 07/21/2016  ? Procedure: LAPAROSCOPIC CHOLECYSTECTOMY CONVERTED TO OPEN;  Surgeon:  Olean Ree, MD;  Location: [facility] ORS;  Service: General;  Laterality: N/A;  ? COLON SURGERY    ? COLONOSCOPY WITH PROPOFOL N/A 12/14/2015  ? Procedure: COLONOSCOPY WITH PROPOFOL;  Surgeon: Manya Silvas, MD;  Location: [facility] ENDOSCOPY;  Service: Endoscopy;  Laterality: N/A;  ? Colostomy Placement    ? PORTA CATH REMOVAL N/A 11/06/2016  ? Procedure: Porta Cath Removal;  Surgeon: Algernon Huxley, MD;  Location: [place] CV LAB;  Service: Cardiovascular;  Laterality: N/A;  ? TONSILLECTOMY AND ADENOIDECTOMY    ? ? ?Family History  ?Problem Relation Age of Onset  ? Prostate cancer Father   ? Heart attack Maternal Grandfather   ? Hyperlipidemia Paternal Grandfather   ? Hypertension Paternal Grandfather   ? Skin cancer Maternal Grandmother   ? ? ?SOCIAL HX:  reports that she has never smoked. She has never used smokeless tobacco. She reports current alcohol use. She reports that she does not use drugs.  ? ? ?Current Outpatient Medications:  ?  amoxicillin-clavulanate (AUGMENTIN) 875-125 MG tablet, Take 1 tablet by mouth 2 (two) times daily., Disp: 14 tablet, Rfl: 0 ?  amphetamine-dextroamphetamine (ADDERALL) 20 MG tablet, Take 1 tablet (20 mg total) by mouth 2 (two) times daily., Disp: 60 tablet, Rfl: 0 ?  amphetamine-dextroamphetamine (ADDERALL) 20 MG tablet, Take 1 tablet (20 mg total) by mouth 2 (two) times daily., Disp: 60 tablet, Rfl: 0 ?  amphetamine-dextroamphetamine (ADDERALL) 20 MG tablet, Take 1 tablet (20 mg total) by mouth 2 (two) times daily., Disp: 60 tablet,  Rfl: 0 ?  dicyclomine (BENTYL) 10 MG capsule, TAKE 1 CAPSULE BY MOUTH 2 TIMES DAILY., Disp: 180 capsule, Rfl: 1 ?  estradiol (ESTRACE) 1 MG tablet, TAKE 1 TABLET BY MOUTH EVERY DAY, Disp: 90 tablet, Rfl: 2 ?  loratadine (CLARITIN) 10 MG tablet, Take 10 mg by mouth daily., Disp: , Rfl:  ?  mometasone (NASONEX) 50 MCG/ACT nasal spray, Place 2 sprays into the nose daily., Disp: , Rfl:  ?  predniSONE (DELTASONE) 10 MG tablet, 6 tablets on Day 1 , then  reduce by 1 tablet daily until gone, Disp: 21 tablet, Rfl: 0 ?  triamcinolone cream (KENALOG) 0.1 %, APPLY TO AFFECTED AREA TWICE A DAY, Disp: 30 g, Rfl: 0 ? ?EXAM: ? ?VITALS per patient if applicable: ? ?GENERAL: alert, oriented, appears well and in no acute distress ? ?HEENT: atraumatic, conjunttiva clear, no obvious abnormalities on inspection of external nose and ears ? ?NECK: normal movements of the head and neck ? ?LUNGS: on inspection no signs of respiratory distress, breathing rate appears normal, no obvious gross SOB, gasping or wheezing ? ?CV: no obvious cyanosis ? ?MS: moves all visible extremities without noticeable abnormality ? ?PSYCH/NEURO: pleasant and cooperative, no obvious depression or anxiety, speech and thought processing grossly intact ? ?ASSESSMENT AND PLAN: ? ?Discussed the following assessment and plan: ? ?Acute non-recurrent frontal sinusitis ? ?Sinusitis, acute frontal ?Given chronicity of symptoms, development of facial pain and exam consistent with bacterial URI,  Will treat with empiric antibiotics, steroid taper, decongestants, and saline lavage.  Recommend use of probiotics and aggressive managemet of seasonal rhinitis ? ?  ?I discussed the assessment and treatment plan with the patient. The patient was provided an opportunity to ask questions and all were answered. The patient agreed with the plan and demonstrated an understanding of the instructions. ?  ?The patient was advised to call back or seek an in-person evaluation if the symptoms worsen or if the condition fails to improve as anticipated. ? ? ?I spent 20 minutes dedicated to the care of this patient on the date of this encounter to include pre-visit review of his medical history,  Face-to-face time with the patient , and post visit ordering of testing and therapeutics.  ? ? ?[name], MD   ?

## 2021-12-17 NOTE — Assessment & Plan Note (Addendum)
Given chronicity of symptoms, development of facial pain and exam consistent with bacterial URI,  Will treat with empiric antibiotics, steroid taper, decongestants, and saline lavage.  Recommend use of probiotics and aggressive managemet of seasonal rhinitis ?

## 2021-12-19 ENCOUNTER — Telehealth: Payer: Self-pay | Admitting: Internal Medicine

## 2021-12-19 ENCOUNTER — Encounter: Payer: Self-pay | Admitting: Internal Medicine

## 2021-12-19 DIAGNOSIS — B083 Erythema infectiosum [fifth disease]: Secondary | ICD-10-CM | POA: Insufficient documentation

## 2021-12-19 NOTE — Telephone Encounter (Signed)
Spoke with pt and informed her of her mychart message. Pt stated that she has had allergies to things in the past and she feels like it is an allergic reaction because she is starting to feel a little wheezing. Pt stated that she is not having any trouble breathing and no tightness in the throat just feels like she is starting to wheeze a little bit. Pt was advised that she should still remain at home until the rash is completely gone. Pt stated that she has a wedding tomorrow at she has to work and I highly advised that she stay home because if this is fifth disease it is spread airborne exposure and if she were to cough or sneeze then it is in the air and would infect someone else.    ?

## 2021-12-19 NOTE — Assessment & Plan Note (Signed)
Occurring after 2 week history of viral URI.  CBC ordered  , advised to remain home until rash resolves.  ?

## 2021-12-19 NOTE — Telephone Encounter (Signed)
Pt is aware.  

## 2021-12-19 NOTE — Telephone Encounter (Signed)
Sent via [employer],  but needs lab visit for CBC  and human Parvo screen  ? ? ?3 of [place] in the office have conferred: if this rash is confined to your face, it's not a typical drug reaction.  It looks more like the rash casued by a viral infection called "fifth disease" or slapped cheek disease" which more commonly appears in children but can occur in adults.  It is caused by a virus called  parvovirus B-19 which can cause joint pain and in some , a very serious anemia (as well as serious congenital issues in pregnant women) so I'd like you stay out of work until the rash resolves,  and plan to come in in the next week or so to have your CBC checked.   I would continue the prednisone and the augmentin since you were taking it for sinusitis  ? ?

## 2022-01-05 ENCOUNTER — Encounter: Payer: Self-pay | Admitting: Internal Medicine

## 2022-01-05 ENCOUNTER — Other Ambulatory Visit: Payer: Self-pay | Admitting: Internal Medicine

## 2022-01-07 MED ORDER — AMPHETAMINE-DEXTROAMPHETAMINE 20 MG PO TABS: 20.0000 mg | ORAL_TABLET | Freq: Two times a day (BID) | ORAL | 0 refills | Status: DC

## 2022-01-22 DIAGNOSIS — Z933 Colostomy status: Secondary | ICD-10-CM | POA: Diagnosis not present

## 2022-02-16 ENCOUNTER — Other Ambulatory Visit: Payer: Self-pay | Admitting: Internal Medicine

## 2022-03-10 ENCOUNTER — Encounter: Payer: Self-pay | Admitting: Internal Medicine

## 2022-03-10 DIAGNOSIS — Z933 Colostomy status: Secondary | ICD-10-CM

## 2022-03-10 DIAGNOSIS — R1030 Lower abdominal pain, unspecified: Secondary | ICD-10-CM

## 2022-03-10 DIAGNOSIS — Z85048 Personal history of other malignant neoplasm of rectum, rectosigmoid junction, and anus: Secondary | ICD-10-CM

## 2022-03-11 NOTE — Telephone Encounter (Signed)
I have pended the referral for approval.  

## 2022-03-12 DIAGNOSIS — Z933 Colostomy status: Secondary | ICD-10-CM | POA: Diagnosis not present

## 2022-03-13 ENCOUNTER — Other Ambulatory Visit: Payer: Self-pay | Admitting: Internal Medicine

## 2022-03-13 DIAGNOSIS — R1030 Lower abdominal pain, unspecified: Secondary | ICD-10-CM

## 2022-03-13 DIAGNOSIS — Z85048 Personal history of other malignant neoplasm of rectum, rectosigmoid junction, and anus: Secondary | ICD-10-CM

## 2022-03-13 DIAGNOSIS — Z933 Colostomy status: Secondary | ICD-10-CM

## 2022-03-13 DIAGNOSIS — E871 Hypo-osmolality and hyponatremia: Secondary | ICD-10-CM

## 2022-03-13 DIAGNOSIS — E538 Deficiency of other specified B group vitamins: Secondary | ICD-10-CM

## 2022-03-13 NOTE — Telephone Encounter (Signed)
I called patient and she was advised that referral for CT placed & she should be called with appointment. She sees you Tuesday & labs are scheduled for tomorrow.She did say that pain had luckily subsided today.

## 2022-03-14 ENCOUNTER — Other Ambulatory Visit (INDEPENDENT_AMBULATORY_CARE_PROVIDER_SITE_OTHER): Payer: 59

## 2022-03-14 DIAGNOSIS — E538 Deficiency of other specified B group vitamins: Secondary | ICD-10-CM | POA: Diagnosis not present

## 2022-03-14 DIAGNOSIS — E871 Hypo-osmolality and hyponatremia: Secondary | ICD-10-CM

## 2022-03-14 LAB — CBC WITH DIFFERENTIAL/PLATELET
Basophils Absolute: 0 10*3/uL (ref 0.0–0.1)
Basophils Relative: 0.9 % (ref 0.0–3.0)
Eosinophils Absolute: 0.1 10*3/uL (ref 0.0–0.7)
Eosinophils Relative: 1.7 % (ref 0.0–5.0)
HCT: 39.5 % (ref 36.0–46.0)
Hemoglobin: 13.3 g/dL (ref 12.0–15.0)
Lymphocytes Relative: 38.9 % (ref 12.0–46.0)
Lymphs Abs: 1.8 10*3/uL (ref 0.7–4.0)
MCHC: 33.7 g/dL (ref 30.0–36.0)
MCV: 90.5 fl (ref 78.0–100.0)
Monocytes Absolute: 0.4 10*3/uL (ref 0.1–1.0)
Monocytes Relative: 7.5 % (ref 3.0–12.0)
Neutro Abs: 2.4 10*3/uL (ref 1.4–7.7)
Neutrophils Relative %: 51 % (ref 43.0–77.0)
Platelets: 212 10*3/uL (ref 150.0–400.0)
RBC: 4.36 Mil/uL (ref 3.87–5.11)
RDW: 14.4 % (ref 11.5–15.5)
WBC: 4.7 10*3/uL (ref 4.0–10.5)

## 2022-03-14 LAB — COMPREHENSIVE METABOLIC PANEL
ALT: 22 U/L (ref 0–35)
AST: 15 U/L (ref 0–37)
Albumin: 3.9 g/dL (ref 3.5–5.2)
Alkaline Phosphatase: 69 U/L (ref 39–117)
BUN: 12 mg/dL (ref 6–23)
CO2: 25 mEq/L (ref 19–32)
Calcium: 9 mg/dL (ref 8.4–10.5)
Chloride: 103 mEq/L (ref 96–112)
Creatinine, Ser: 0.84 mg/dL (ref 0.40–1.20)
GFR: 90.72 mL/min (ref >60.00)
Glucose, Bld: 92 mg/dL (ref 70–99)
Potassium: 4 mEq/L (ref 3.5–5.1)
Sodium: 137 mEq/L (ref 135–145)
Total Bilirubin: 0.4 mg/dL (ref 0.2–1.2)
Total Protein: 6.4 g/dL (ref 6.0–8.3)

## 2022-03-18 ENCOUNTER — Encounter: Payer: Self-pay | Admitting: Internal Medicine

## 2022-03-18 ENCOUNTER — Ambulatory Visit (INDEPENDENT_AMBULATORY_CARE_PROVIDER_SITE_OTHER): Payer: 59 | Admitting: Internal Medicine

## 2022-03-18 VITALS — BP 120/78 | HR 88 | Temp 97.9°F | Ht 62.0 in | Wt 205.0 lb

## 2022-03-18 DIAGNOSIS — Z933 Colostomy status: Secondary | ICD-10-CM | POA: Diagnosis not present

## 2022-03-18 DIAGNOSIS — Z85048 Personal history of other malignant neoplasm of rectum, rectosigmoid junction, and anus: Secondary | ICD-10-CM

## 2022-03-18 NOTE — Progress Notes (Signed)
Subjective:  Patient ID: Rachael Cowan, female    DOB: 16-Feb-1986  Age: 36 y.o. MRN: [id number]  CC: The primary encounter diagnosis was History of rectal cancer. A diagnosis of S/P colostomy (HCC) was also pertinent to this visit.   HPI Rachael Cowan is a 36 yr old female with a h/o  stage IIIB (T4N1M0) rectal cancer s/p neoadjuvant radiation and 5FU (completed on 03/07/2012) followed by rectosigmoidectomy, hysterectomy and oophorectomy on 05/13/2012 at [facility] cancer .  She has chronic abd pain associated with passage of stool into her colostomy  and presents after recovering from a week long episode  of severe lower  abdominal pain related to passing stool, accompanied by peristomal tenderness of abdominal  wall , mild nausea and anorexia without vomiting, fevers,  change in consistency of feces, or  hematochezia .  She simplified her diet for several days, and all symptom shave now resolved,   Last colonoscopy was done in  2017 by Dr Vira Agar [facility]).  She also wonders if she needs a stoma revision . It was done in 2013 and she has not seen a surgeon since then and feels that it may be shrinking or indurating.  She  is requesting to see a general surgeon)   Chief Complaint  Patient presents with   Abdominal Pain    Sever GI pain has history of rectal cancer would like a GI consult for a colonoscopy      Outpatient Medications Prior to Visit  Medication Sig Dispense Refill   amphetamine-dextroamphetamine (ADDERALL) 20 MG tablet Take 1 tablet (20 mg total) by mouth 2 (two) times daily. 60 tablet 0   amphetamine-dextroamphetamine (ADDERALL) 20 MG tablet Take 1 tablet (20 mg total) by mouth 2 (two) times daily. 60 tablet 0   amphetamine-dextroamphetamine (ADDERALL) 20 MG tablet Take 1 tablet (20 mg total) by mouth 2 (two) times daily. 60 tablet 0   dicyclomine (BENTYL) 10 MG capsule TAKE 1 CAPSULE BY MOUTH TWICE A DAY 180 capsule 1   estradiol (ESTRACE) 1 MG tablet TAKE 1  TABLET BY MOUTH EVERY DAY 90 tablet 2   loratadine (CLARITIN) 10 MG tablet Take 10 mg by mouth daily.     mometasone (NASONEX) 50 MCG/ACT nasal spray Place 2 sprays into the nose daily.     predniSONE (DELTASONE) 10 MG tablet 6 tablets on Day 1 , then reduce by 1 tablet daily until gone 21 tablet 0   triamcinolone cream (KENALOG) 0.1 % APPLY TO AFFECTED AREA TWICE A DAY 30 g 0   amoxicillin-clavulanate (AUGMENTIN) 875-125 MG tablet Take 1 tablet by mouth 2 (two) times daily. (Patient not taking: Reported on 03/18/2022) 14 tablet 0   No facility-administered medications prior to visit.    Review of Systems;  Patient denies headache, fevers, malaise, unintentional weight loss, skin rash, eye pain, sinus congestion and sinus pain, sore throat, dysphagia,  hemoptysis , cough, dyspnea, wheezing, chest pain, palpitations, orthopnea, edema, abdominal pain, nausea, melena, diarrhea, constipation, flank pain, dysuria, hematuria, urinary  Frequency, nocturia, numbness, tingling, seizures,  Focal weakness, Loss of consciousness,  Tremor, insomnia, depression, anxiety, and suicidal ideation.      Objective:  BP 120/78 (BP Location: Left Arm, Patient Position: Sitting, Cuff Size: Normal)   Pulse 88   Temp 97.9 F (36.6 C) (Oral)   Ht '5\' 2"'$  (1.575 m)   Wt 205 lb (93 kg)   LMP 10/25/2011   SpO2 99%   BMI 37.49 kg/m  BP Readings from Last 3 Encounters:  03/18/22 120/78  12/11/21 108/75  11/07/21 121/82    Wt Readings from Last 3 Encounters:  03/18/22 205 lb (93 kg)  12/17/21 206 lb 8 oz (93.7 kg)  11/07/21 206 lb 8 oz (93.7 kg)    General appearance: alert, cooperative and appears stated age Ears: normal TM's and external ear canals both ears Throat: lips, mucosa, and tongue normal; teeth and gums normal Neck: no adenopathy, no carotid bruit, supple, symmetrical, trachea midline and thyroid not enlarged, symmetric, no tenderness/mass/nodules Back: symmetric, no curvature. ROM normal. No  CVA tenderness. Lungs: clear to auscultation bilaterally Heart: regular rate and rhythm, S1, S2 normal, no murmur, click, rub or gallop Abdomen: soft, non-tender; bowel sounds normal; no masses,  no organomegaly Pulses: 2+ and symmetric Skin: Skin color, texture, turgor normal. No rashes or lesions Lymph nodes: Cervical, supraclavicular, and axillary nodes normal.  Lab Results  Component Value Date   HGBA1C 5.5 02/01/2020   HGBA1C 5.6 07/17/2016    Lab Results  Component Value Date   CREATININE 0.84 03/14/2022   CREATININE 1.04 (H) 11/21/2021   CREATININE 1.12 (H) 11/07/2021    Lab Results  Component Value Date   WBC 4.7 03/14/2022   HGB 13.3 03/14/2022   HCT 39.5 03/14/2022   PLT 212.0 03/14/2022   GLUCOSE 92 03/14/2022   CHOL 223 (H) 02/01/2020   TRIG (H) 02/01/2020    553.0 Triglyceride is over 400; calculations on Lipids are invalid.   HDL 61.60 02/01/2020   LDLDIRECT 77.0 02/01/2020   ALT 22 03/14/2022   AST 15 03/14/2022   NA 137 03/14/2022   K 4.0 03/14/2022   CL 103 03/14/2022   CREATININE 0.84 03/14/2022   BUN 12 03/14/2022   CO2 25 03/14/2022   TSH 3.46 02/01/2020   HGBA1C 5.5 02/01/2020    No results found.  Assessment & Plan:   Problem List Items Addressed This Visit     History of rectal cancer - Primary     stage IIIB (T4N1M0) rectal cancer s/p neoadjuvant radiation and 5FU (completed on 03/07/2012) followed by rectosigmoidectomy, hysterectomy and oophorectomy on 05/13/2012 at [facility].  She has annual CEAs and CT scans by Oncology.   Last colonoscopy normal in 2017 Vira Agar, [facility]).  Referral to [facility] in progress       Relevant Orders   Ambulatory referral to Gastroenterology   S/P colostomy ([place])    Secondary to stage IIIB rectal CA , done in 2013 .  Referral to [facility] for evaluation for possible need for revision       Relevant Orders   Ambulatory referral to General Surgery    Follow-up: No follow-ups on  file.   [name], MD

## 2022-03-18 NOTE — Assessment & Plan Note (Signed)
stage IIIB(T4N1M0)rectal cancers/p neoadjuvant radiation and 5FU(completed on 03/07/2012) followed by rectosigmoidectomy, hysterectomy and oophorectomy on 05/13/2012 at [facility].  She has annual CEAs and CT scans by Oncology.   Last colonoscopy normal in 2017 Vira Agar, [facility]).  Referral to [facility] in progress

## 2022-03-18 NOTE — Assessment & Plan Note (Signed)
Secondary to stage IIIB rectal CA , done in 2013 .  Referral to [facility] for evaluation for possible need for revision

## 2022-03-19 ENCOUNTER — Other Ambulatory Visit: Payer: Self-pay | Admitting: Internal Medicine

## 2022-03-19 MED ORDER — AMPHETAMINE-DEXTROAMPHETAMINE 20 MG PO TABS: 20.0000 mg | ORAL_TABLET | Freq: Two times a day (BID) | ORAL | 0 refills | Status: DC

## 2022-03-25 ENCOUNTER — Telehealth: Payer: Self-pay | Admitting: Internal Medicine

## 2022-03-25 NOTE — Telephone Encounter (Signed)
Lft pt vm to call ofc to sch CT. Thank you! ?

## 2022-03-31 ENCOUNTER — Ambulatory Visit: Payer: 59 | Admitting: Internal Medicine

## 2022-04-04 ENCOUNTER — Telehealth: Payer: Self-pay | Admitting: Gastroenterology

## 2022-04-04 ENCOUNTER — Ambulatory Visit
Admission: RE | Admit: 2022-04-04 | Discharge: 2022-04-04 | Disposition: A | Discharge status: Home / Self Care | Payer: 59 | Source: Ambulatory Visit | Attending: Internal Medicine | Admitting: Internal Medicine

## 2022-04-04 DIAGNOSIS — Z933 Colostomy status: Secondary | ICD-10-CM | POA: Insufficient documentation

## 2022-04-04 DIAGNOSIS — Z85048 Personal history of other malignant neoplasm of rectum, rectosigmoid junction, and anus: Secondary | ICD-10-CM | POA: Diagnosis not present

## 2022-04-04 DIAGNOSIS — R1030 Lower abdominal pain, unspecified: Secondary | ICD-10-CM | POA: Insufficient documentation

## 2022-04-04 DIAGNOSIS — K6389 Other specified diseases of intestine: Secondary | ICD-10-CM | POA: Diagnosis not present

## 2022-04-04 MED ORDER — IOHEXOL 300 MG/ML  SOLN
100.0000 mL | Freq: Once | INTRAMUSCULAR | Status: AC | PRN
  Administered 2022-04-04: 100 mL via INTRAVENOUS

## 2022-04-04 NOTE — Telephone Encounter (Signed)
Good Afternoon Dr. Ardis Hughs,  Patient called stating that she had a referral in for History of rectal cancer. Patient was last seen by you 10 years ago and since then has seen [place] [facility]. Patient stated she had a colonoscopy in 2017 and also had a OV with them in 2019. Patient is requesting to come back to our office due to Dr. Tiffany Kocher retiring. Patients records are in [place], will you please review and advise on scheduling patient?  Thank you.

## 2022-04-06 ENCOUNTER — Encounter: Payer: Self-pay | Admitting: Internal Medicine

## 2022-04-06 DIAGNOSIS — K435 Parastomal hernia without obstruction or  gangrene: Secondary | ICD-10-CM

## 2022-04-07 ENCOUNTER — Encounter: Payer: Self-pay | Admitting: Gastroenterology

## 2022-04-07 DIAGNOSIS — K435 Parastomal hernia without obstruction or  gangrene: Secondary | ICD-10-CM | POA: Insufficient documentation

## 2022-04-07 NOTE — Assessment & Plan Note (Signed)
Noted on recent CT Abd/pelvis during workup for abdominal pain now resolved.  H/o partial colectomy and colostomy done remotely at [facility], patient prefers to address any surgical issues locally.  Referral to [facility]: Lambert Rhonda W

## 2022-04-07 NOTE — Telephone Encounter (Signed)
Patient was scheduled with Dr. Ardis Hughs on 8/15 at 9:30.

## 2022-04-14 ENCOUNTER — Ambulatory Visit: Payer: 59 | Admitting: Internal Medicine

## 2022-04-24 ENCOUNTER — Other Ambulatory Visit: Payer: Self-pay | Admitting: Internal Medicine

## 2022-04-24 DIAGNOSIS — Z933 Colostomy status: Secondary | ICD-10-CM | POA: Diagnosis not present

## 2022-04-24 MED ORDER — AMPHETAMINE-DEXTROAMPHETAMINE 20 MG PO TABS
20.0000 mg | ORAL_TABLET | Freq: Two times a day (BID) | ORAL | 0 refills | Status: DC
Start: 2022-04-24 — End: 2022-05-28

## 2022-04-24 NOTE — Telephone Encounter (Signed)
Refilled: 03/19/2022 Last OV: 03/18/2022 Next OV: not scheduled

## 2022-05-05 DIAGNOSIS — K9403 Colostomy malfunction: Secondary | ICD-10-CM | POA: Diagnosis not present

## 2022-05-09 ENCOUNTER — Encounter: Payer: Self-pay | Admitting: Internal Medicine

## 2022-05-13 ENCOUNTER — Ambulatory Visit: Payer: 59 | Admitting: Gastroenterology

## 2022-05-28 ENCOUNTER — Other Ambulatory Visit: Payer: Self-pay | Admitting: Internal Medicine

## 2022-05-28 MED ORDER — AMPHETAMINE-DEXTROAMPHETAMINE 20 MG PO TABS: 20.0000 mg | ORAL_TABLET | Freq: Two times a day (BID) | ORAL | 0 refills | Status: DC

## 2022-06-11 ENCOUNTER — Encounter: Payer: Self-pay | Admitting: Internal Medicine

## 2022-06-11 ENCOUNTER — Ambulatory Visit: Payer: 59 | Admitting: Internal Medicine

## 2022-06-11 VITALS — BP 116/72 | HR 73 | Ht 62.0 in | Wt 212.0 lb

## 2022-06-11 DIAGNOSIS — Z85048 Personal history of other malignant neoplasm of rectum, rectosigmoid junction, and anus: Secondary | ICD-10-CM

## 2022-06-11 NOTE — Progress Notes (Signed)
Chief Complaint: History of rectal cancer  HPI : 36 year old female with history of rectal cancer s/p surgery/radiation/chemotherapy, asthma, OSA presents with history of rectal cancer.  A couple of months ago, she experienced a large amount of ab pain located near her stoma. She suspected that her pain was a GI bug that was causing the pain. She has been told that the pain may be due to scar tissue. She is not having any ab pain anymore. She has not had to use her dicyclomine. Cosmetically she still has issues the stoma. The stool coming out of the stoma is relatively normal. There is no particular food that seems to trigger her ab pain. Denies N&V, dysphagia, weight loss, or blood in stools. Denies family history of GI cancers. She was genetically tested and did not have any underlying cancer syndromes. She did see Dr. Marcello Moores with surgery for consideration of stoma revision. She thinks that she has had 3 colonoscopies since she was first diagnosed and treated for her rectal cancer in 2013-2014. Her most recent colonoscopy was in 2017 with removal of one colon polyp. In the past she states that she has had some of the mucosa near her stoma cauterized.  Wt Readings from Last 3 Encounters:  06/11/22 212 lb (96.2 kg)  03/18/22 205 lb (93 kg)  12/17/21 206 lb 8 oz (93.7 kg)   Past Medical History:  Diagnosis Date   Allergy    Asthma    Bowel obstruction (HCC)    Colon polyp    Gallstones    Hypersomnolence disorder 2005   managed with metidate   Obesity    Rectal cancer ([place]) 05/13/2012   Resection with colostomy and hysterectomy.   Sleep apnea 2005   resolved with ENT surgery,  [name]    Past Surgical History:  Procedure Laterality Date   ABDOMINAL HYSTERECTOMY     ARTHROSCOPIC REPAIR ACL  2006   right knee Dr Donna Christen at [place] 07/21/2016   Procedure: LAPAROSCOPIC CHOLECYSTECTOMY CONVERTED TO OPEN;  Surgeon: Olean Ree, MD;  Location: [facility] ORS;  Service:  General;  Laterality: N/A;   COLON SURGERY     COLONOSCOPY WITH PROPOFOL N/A 12/14/2015   Procedure: COLONOSCOPY WITH PROPOFOL;  Surgeon: Manya Silvas, MD;  Location: [facility] ENDOSCOPY;  Service: Endoscopy;  Laterality: N/A;   Colostomy Placement     HERNIA REPAIR     HYSTEROTOMY     PORTA CATH REMOVAL N/A 11/06/2016   Procedure: Glori Luis Cath Removal;  Surgeon: Algernon Huxley, MD;  Location: [place] CV LAB;  Service: Cardiovascular;  Laterality: N/A;   TONSILLECTOMY AND ADENOIDECTOMY     Family History  Problem Relation Age of Onset   Prostate cancer Father    Skin cancer Maternal Grandmother    Heart attack Maternal Grandfather    Hyperlipidemia Paternal Grandfather    Hypertension Paternal Grandfather    Stomach cancer Neg Hx    Esophageal cancer Neg Hx    Colon cancer Neg Hx    Social History   Tobacco Use   Smoking status: Never   Smokeless tobacco: Never  Vaping Use   Vaping Use: Never used  Substance Use Topics   Alcohol use: Yes    Comment: ocassional   Drug use: No   Current Outpatient Medications  Medication Sig Dispense Refill   amphetamine-dextroamphetamine (ADDERALL) 20 MG tablet Take 1 tablet (20 mg total) by mouth 2 (two) times daily. 60 tablet 0  amphetamine-dextroamphetamine (ADDERALL) 20 MG tablet Take 1 tablet (20 mg total) by mouth 2 (two) times daily. 60 tablet 0   amphetamine-dextroamphetamine (ADDERALL) 20 MG tablet Take 1 tablet (20 mg total) by mouth 2 (two) times daily. 60 tablet 0   dicyclomine (BENTYL) 10 MG capsule TAKE 1 CAPSULE BY MOUTH TWICE A DAY 180 capsule 1   estradiol (ESTRACE) 1 MG tablet TAKE 1 TABLET BY MOUTH EVERY DAY 90 tablet 2   loratadine (CLARITIN) 10 MG tablet Take 10 mg by mouth in the morning and at bedtime.     mometasone (NASONEX) 50 MCG/ACT nasal spray Place 2 sprays into the nose daily.     triamcinolone cream (KENALOG) 0.1 % APPLY TO AFFECTED AREA TWICE A DAY 30 g 0   No current facility-administered medications for  this visit.   Allergies  Allergen Reactions   Augmentin [Amoxicillin-Pot Clavulanate] Rash    "Made me feel flushed"     Review of Systems: All systems reviewed and negative except where noted in HPI.   Physical Exam: BP 116/72   Pulse 73   Ht '5\' 2"'$  (1.575 m)   Wt 212 lb (96.2 kg)   LMP 10/25/2011   BMI 38.78 kg/m  Constitutional: Pleasant,well-developed, female in no acute distress. HEENT: Normocephalic and atraumatic. Conjunctivae are normal. No scleral icterus. Cardiovascular: Normal rate, regular rhythm.  Pulmonary/chest: Effort normal and breath sounds normal. No wheezing, rales or rhonchi. Abdominal: Soft, nondistended, nontender. Ostomy present in the LLQ. Extremities: No edema Neurological: Alert and oriented to person place and time. Skin: Skin is warm and dry. No rashes noted. Psychiatric: Normal mood and affect. Behavior is normal.  CT A/P w/contrast 10/15/16: IMPRESSION: 1. No acute findings identified within the abdomen or pelvis. 2. No specific features identified to suggest recurrent rectal cancer or metastatic disease. 3. Left lower quadrant colostomy with fat containing parastomal hernia. Evidence of mild chronic inflammation of the distal colon is noted up to the level of the ostomy.  CT A/P w/contrast 04/04/22: IMPRESSION: 1. Previous left hemicolectomy with left lower quadrant colostomy. Persistent and chronic wall thickening of the distal most aspect of the colon just proximal to the stoma. There is a large amount of retained fecal material throughout the rest of the colon, possibly secondary to the segment of distal chronic wall thickening/stricturing, correlate with output. 2. No lymphadenopathy or other suspicious mass identified.  Colonoscopy 12/14/15: Findings: Scope passed through the ostomy site to the cecum without difficulty A diminutive polyp was found in the sigmoid colon. The polyp was sessile. The polyp was removed with a jumbo cold  forceps. Resection and retrieval were complete. Ostomy area colonic mucosa showed very tiny bumps, possible grannulation tissue possible polyp tisssue. Biopsies done of these tiny bumps colon was otherwise normal. Path: DIAGNOSIS:  A. COLON POLYP, SIGMOID; COLD BIOPSY:  - TUBULAR ADENOMA, FRAGMENTS.  - NEGATIVE FOR HIGH-GRADE DYSPLASIA AND MALIGNANCY.  B. NODULES AT TOP OF COLOSTOMY; COLD BIOPSY:  -  INFLAMED AND ULCERATED COLOCUTANEOUS TISSUE.  - POLYPOID INFLAMED GRANULATION TISSUE.  - MULTIPLE DEEPER SECTIONS EXAMINED.  - NEGATIVE FOR DYSPLASIA AND MALIGNANCY.    ASSESSMENT AND PLAN: History of rectal cancer Patient presents for follow up of rectal cancer that was diagnosed in 2013. Her last colonoscopy was in 2017 that showed one small tubular adenoma that was resected and nodular mucosa near the ostomy entrance that showed inflamed and ulcerated granulation tissue. Will plan to update her rectal cancer surveillance by performing a colonoscopy. -  Colonoscopy through ostomy [facility]  Christia Reading, MD

## 2022-06-11 NOTE — Patient Instructions (Signed)
You have been scheduled for a colonoscopy. Please follow written instructions given to you at your visit today.  Please pick up your prep supplies at the pharmacy within the next 1-3 days. If you use inhalers (even only as needed), please bring them with you on the day of your procedure.  Due to recent changes in healthcare laws, you may see the results of your imaging and laboratory studies on MyChart before your provider has had a chance to review them.  We understand that in some cases there may be results that are confusing or concerning to you. Not all laboratory results come back in the same time frame and the provider may be waiting for multiple results in order to interpret others.  Please give [place] 48 hours in order for your provider to thoroughly review all the results before contacting the office for clarification of your results.   I appreciate the opportunity to care for you. Georgian Co, MD

## 2022-06-20 DIAGNOSIS — Z933 Colostomy status: Secondary | ICD-10-CM | POA: Diagnosis not present

## 2022-06-30 ENCOUNTER — Other Ambulatory Visit: Payer: Self-pay | Admitting: Internal Medicine

## 2022-07-01 MED ORDER — AMPHETAMINE-DEXTROAMPHETAMINE 20 MG PO TABS: 20.0000 mg | ORAL_TABLET | Freq: Two times a day (BID) | ORAL | 0 refills | Status: DC

## 2022-07-22 ENCOUNTER — Telehealth: Payer: Self-pay | Admitting: Internal Medicine

## 2022-07-22 NOTE — Telephone Encounter (Signed)
Good Afternoon Dr. Lorenso Courier,  Patient called stating she needed to cancel her appointment for her colonoscopy on 10/27 with you due to her father passing away.   Patient stated she will call back at a later time to reschedule.

## 2022-07-25 ENCOUNTER — Encounter: Payer: 59 | Admitting: Internal Medicine

## 2022-07-27 ENCOUNTER — Other Ambulatory Visit: Payer: Self-pay | Admitting: Internal Medicine

## 2022-08-13 ENCOUNTER — Ambulatory Visit
Admission: RE | Admit: 2022-08-13 | Discharge: 2022-08-13 | Disposition: A | Discharge status: Home / Self Care | Payer: 59 | Source: Ambulatory Visit | Attending: Urgent Care | Admitting: Urgent Care

## 2022-08-13 VITALS — BP 100/71 | HR 104 | Temp 97.7°F | Resp 18

## 2022-08-13 DIAGNOSIS — Z1152 Encounter for screening for COVID-19: Secondary | ICD-10-CM | POA: Insufficient documentation

## 2022-08-13 DIAGNOSIS — R6889 Other general symptoms and signs: Secondary | ICD-10-CM | POA: Insufficient documentation

## 2022-08-13 LAB — RESP PANEL BY RT-PCR (FLU A&B, COVID) ARPGX2
Influenza A by PCR: NEGATIVE
Influenza B by PCR: NEGATIVE
SARS Coronavirus 2 by RT PCR: NEGATIVE

## 2022-08-13 LAB — POCT INFLUENZA A/B
Influenza A, POC: NEGATIVE
Influenza B, POC: NEGATIVE

## 2022-08-13 NOTE — ED Provider Notes (Signed)
Rachael Cowan    CSN: [id number] Arrival date & time: 08/13/22  1010      History   Chief Complaint Chief Complaint  Patient presents with   Influenza    Need a flu test - Entered by patient    HPI Rachael Cowan is a 36 y.o. female.    Influenza   This to UC with complaint of body aches, headache, diarrhea, nasal congestion starting last night.  Patient expresses concern for flu and requests testing.  Past Medical History:  Diagnosis Date   Allergy    Asthma    Bowel obstruction ([place])    Colon polyp    Gallstones    Hypersomnolence disorder 2005   managed with metidate   Obesity    Rectal cancer ([place]) 05/13/2012   Resection with colostomy and hysterectomy.   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   Sinusitis, acute frontal 12/17/2021   Influenza A 08/07/2021   Facial rash 08/05/2020   Hypertriglyceridemia 08/05/2020   Vesicular rash 02/01/2020   Contact dermatitis and eczema due to plant 02/01/2020   Encounter for monitoring estrogen replacement therapy following surgical menopause 07/15/2019   Hyponatremia 01/30/2019   S/P colostomy ([place]) 01/30/2019   Educated about COVID-19 virus infection 01/30/2019   Abnormal breast exam 08/01/2018   Encounter for preventive health examination 06/17/2017   S/P cholecystectomy 09/10/2016   History of hematuria 07/19/2016   Hepatic steatosis 07/19/2016   B12 deficiency 06/23/2013   Obesity 06/23/2013   Other and unspecified hyperlipidemia 06/23/2013   Generalized abdominal cramping 06/01/2012   Menopause 05/13/2012   Airway hyperreactivity 05/07/2012   Hypersomnia 05/07/2012   Asthma 05/07/2012   History of rectal cancer 01/13/2012   Primary hypersomnolence disorder 11/12/2011    Past Surgical History:  Procedure Laterality Date    ABDOMINAL HYSTERECTOMY     ARTHROSCOPIC REPAIR ACL  2006   right knee Dr Donna Christen at [place] 07/21/2016   Procedure: LAPAROSCOPIC CHOLECYSTECTOMY CONVERTED TO OPEN;  Surgeon: Olean Ree, MD;  Location: [facility] ORS;  Service: General;  Laterality: N/A;   COLON SURGERY     COLONOSCOPY WITH PROPOFOL N/A 12/14/2015   Procedure: COLONOSCOPY WITH PROPOFOL;  Surgeon: Manya Silvas, MD;  Location: [facility] ENDOSCOPY;  Service: Endoscopy;  Laterality: N/A;   Colostomy Placement     HERNIA REPAIR     HYSTEROTOMY     PORTA CATH REMOVAL N/A 11/06/2016   Procedure: Glori Luis Cath Removal;  Surgeon: Algernon Huxley, MD;  Location: [facility];  Service: Cardiovascular;  Laterality: N/A;   TONSILLECTOMY AND ADENOIDECTOMY      OB History   No obstetric history on file.      Home Medications    Prior to Admission medications   Medication Sig Start Date End Date Taking? Authorizing Provider  amphetamine-dextroamphetamine (ADDERALL) 20 MG tablet Take 1 tablet (20 mg total) by mouth 2 (two) times daily. 11/20/21   [name], MD  amphetamine-dextroamphetamine (ADDERALL) 20 MG tablet Take 1 tablet (20 mg total) by mouth 2 (two) times daily. 11/20/21   [name], MD  amphetamine-dextroamphetamine (ADDERALL) 20 MG tablet Take 1 tablet (20 mg total) by mouth 2 (two) times daily. 07/01/22   [name], MD  dicyclomine (BENTYL) 10 MG capsule  TAKE 1 CAPSULE BY MOUTH TWICE A DAY 02/17/22   [name], MD  estradiol (ESTRACE) 1 MG tablet TAKE 1 TABLET BY MOUTH EVERY DAY 07/28/22   [name], MD  loratadine (CLARITIN) 10 MG tablet Take 10 mg by mouth in the morning and at bedtime.    [provider]  mometasone (NASONEX) 50 MCG/ACT nasal spray Place 2 sprays into the nose daily.    [provider]  triamcinolone cream (KENALOG) 0.1 % APPLY TO AFFECTED AREA TWICE A DAY 02/09/20   [name], MD    Family History Family History  Problem Relation  Age of Onset   Prostate cancer Father    Skin cancer Maternal Grandmother    Heart attack Maternal Grandfather    Hyperlipidemia Paternal Grandfather    Hypertension Paternal Grandfather    Stomach cancer Neg Hx    Esophageal cancer Neg Hx    Colon cancer Neg Hx     Social History Social History   Tobacco Use   Smoking status: Never   Smokeless tobacco: Never  Vaping Use   Vaping Use: Never used  Substance Use Topics   Alcohol use: Yes    Comment: ocassional   Drug use: No     Allergies   Augmentin [amoxicillin-pot clavulanate]   Review of Systems Review of Systems   Physical Exam Triage Vital Signs ED Triage Vitals  Enc Vitals Group     BP      Pulse      Resp      Temp      Temp src      SpO2      Weight      Height      Head Circumference      Peak Flow      Pain Score      Pain Loc      Pain Edu?      Excl. in [place]?    No data found.  Updated Vital Signs LMP 10/25/2011   Visual Acuity Right Eye Distance:   Left Eye Distance:   Bilateral Distance:    Right Eye Near:   Left Eye Near:    Bilateral Near:     Physical Exam Vitals reviewed.  Constitutional:      Appearance: Normal appearance.  HENT:     Right Ear: Tympanic membrane normal.     Left Ear: Tympanic membrane normal.     Mouth/Throat:     Mouth: Mucous membranes are moist.     Pharynx: No oropharyngeal exudate or posterior oropharyngeal erythema.  Eyes:     Conjunctiva/sclera: Conjunctivae normal.     Pupils: Pupils are equal, round, and reactive to light.  Cardiovascular:     Rate and Rhythm: Regular rhythm. Tachycardia present.     Pulses: Normal pulses.     Heart sounds: Normal heart sounds.  Pulmonary:     Effort: Pulmonary effort is normal.     Breath sounds: Normal breath sounds. No wheezing.  Skin:    General: Skin is warm and dry.  Neurological:     General: No focal deficit present.     Mental Status: She is alert and oriented to person, place, and time.   Psychiatric:        Mood and Affect: Mood normal.        Behavior: Behavior normal.      UC Treatments / Results  Labs (all labs ordered are listed, but only abnormal  results are displayed) Labs Reviewed - No data to display  EKG   Radiology No results found.  Procedures Procedures (including critical care time)  Medications Ordered in UC Medications - No data to display  Initial Impression / Assessment and Plan / UC Course  I have reviewed the triage vital signs and the nursing notes.  Pertinent labs & imaging results that were available during my care of the patient were reviewed by me and considered in my medical decision making (see chart for details).   Presents to UC with flu like symptoms. Patient is afebrile here without recent antipyretics. Tachycardia present. Satting well on room air. Overall is well appearing, well hydrated, without respiratory distress. Pulmonary exam is unremarkable.   Requested rapid flu is negative. Respiratory pathogen swab for covid/flu/rsv is pending. Recommended symptomatic treatment as needed with OTC medications.  Final Clinical Impressions(s) / UC Diagnoses   Final diagnoses:  None   Discharge Instructions   None    ED Prescriptions   None    PDMP not reviewed this encounter.   Rose Phi, [place] 08/13/22 1036

## 2022-08-13 NOTE — Discharge Instructions (Signed)
You have been diagnosed with a viral infection.  This illness cannot be treated with antibiotics, it is self limiting.  Recommend you use over-the-counter medications for symptom control including Tylenol or ibuprofen for fever and age-appropriate cold/cough medication.  Saline mist spray is helpful for removing excess mucus from your nose.  Room humidifiers are helpful to ease breathing at night.  Follow up here or with your primary care provider if your symptoms are worsening or not improving.

## 2022-08-13 NOTE — ED Triage Notes (Signed)
Pt. Presents to UC w/c/o body aches, headaches, diarrhea and nasal congestion since last night. Pt. Expresses concern for the FLU.

## 2022-08-14 DIAGNOSIS — Z933 Colostomy status: Secondary | ICD-10-CM | POA: Diagnosis not present

## 2022-08-16 ENCOUNTER — Ambulatory Visit
Admission: RE | Admit: 2022-08-16 | Discharge: 2022-08-16 | Disposition: A | Discharge status: Home / Self Care | Payer: 59 | Source: Ambulatory Visit | Attending: Emergency Medicine | Admitting: Emergency Medicine

## 2022-08-16 VITALS — BP 112/82 | HR 100 | Temp 98.0°F | Resp 18 | Ht 62.0 in | Wt 200.0 lb

## 2022-08-16 DIAGNOSIS — Z933 Colostomy status: Secondary | ICD-10-CM

## 2022-08-16 DIAGNOSIS — R110 Nausea: Secondary | ICD-10-CM | POA: Diagnosis not present

## 2022-08-16 DIAGNOSIS — R197 Diarrhea, unspecified: Secondary | ICD-10-CM

## 2022-08-16 MED ORDER — ONDANSETRON 4 MG PO TBDP: 4.0000 mg | ORAL_TABLET | Freq: Three times a day (TID) | ORAL | 0 refills | Status: DC | PRN

## 2022-08-16 NOTE — Discharge Instructions (Addendum)
Take the antinausea medication as directed.    Keep yourself hydrated with clear liquids, such as water and Gatorade.  Advance to the diarrhea diet as tolerated.   Go to the emergency department if you have acute worsening symptoms.    Follow up with your primary care provider on Monday.

## 2022-08-16 NOTE — ED Provider Notes (Signed)
Rachael Cowan    CSN: [id number] Arrival date & time: 08/16/22  1408      History   Chief Complaint Chief Complaint  Patient presents with   Diarrhea    Was negative got the flu, COVID, and RSV on Tuesday. I have since had serve diarrhea and gas pains. I've take Imodium and still hav watery stool and lots of pain - Entered by patient    HPI Rachael Cowan is a 36 y.o. female.  Patient presents with diarrhea x3 to 4 days.  At the onset of her symptoms, she had fever and congestion but these have resolved.  She feels nauseated but has had no vomiting.  No recent travel out of the country.  No recent antibiotics.  She denies abdominal pain, rash, sore throat, cough, shortness of breath, or other symptoms.  No OTC medications taken today but took ibuprofen yesterday.  Patient was seen at this urgent care on 08/13/2022; diagnosed with flulike symptoms; rapid flu negative, respiratory panel negative; treated symptomatically.  Patient's medical history includes asthma, rectal cancer, colostomy.  The history is provided by the patient and medical records.    Past Medical History:  Diagnosis Date   Allergy    Asthma    Bowel obstruction ([place])    Colon polyp    Gallstones    Hypersomnolence disorder 2005   managed with metidate   Obesity    Rectal cancer ([place]) 05/13/2012   Resection with colostomy and hysterectomy.   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   Sinusitis, acute frontal 12/17/2021   Influenza A 08/07/2021   Facial rash 08/05/2020   Hypertriglyceridemia 08/05/2020   Vesicular rash 02/01/2020   Contact dermatitis and eczema due to plant 02/01/2020   Encounter for monitoring estrogen replacement therapy following surgical menopause 07/15/2019   Hyponatremia 01/30/2019   S/P colostomy  ([place]) 01/30/2019   Educated about COVID-19 virus infection 01/30/2019   Abnormal breast exam 08/01/2018   Encounter for preventive health examination 06/17/2017   S/P cholecystectomy 09/10/2016   History of hematuria 07/19/2016   Hepatic steatosis 07/19/2016   B12 deficiency 06/23/2013   Obesity 06/23/2013   Other and unspecified hyperlipidemia 06/23/2013   Generalized abdominal cramping 06/01/2012   Menopause 05/13/2012   Airway hyperreactivity 05/07/2012   Hypersomnia 05/07/2012   Asthma 05/07/2012   History of rectal cancer 01/13/2012   Primary hypersomnolence disorder 11/12/2011    Past Surgical History:  Procedure Laterality Date   ABDOMINAL HYSTERECTOMY     ARTHROSCOPIC REPAIR ACL  2006   right knee Dr Donna Christen at [place] 07/21/2016   Procedure: LAPAROSCOPIC CHOLECYSTECTOMY CONVERTED TO OPEN;  Surgeon: Olean Ree, MD;  Location: [facility] ORS;  Service: General;  Laterality: N/A;   COLON SURGERY     COLONOSCOPY WITH PROPOFOL N/A 12/14/2015   Procedure: COLONOSCOPY WITH PROPOFOL;  Surgeon: Manya Silvas, MD;  Location: [facility] ENDOSCOPY;  Service: Endoscopy;  Laterality: N/A;   Colostomy Placement     HERNIA REPAIR     HYSTEROTOMY     PORTA CATH REMOVAL N/A 11/06/2016   Procedure: Glori Luis Cath Removal;  Surgeon: Algernon Huxley, MD;  Location: [facility];  Service: Cardiovascular;  Laterality: N/A;   TONSILLECTOMY AND ADENOIDECTOMY      OB History  No obstetric history on file.      Home Medications    Prior to Admission medications   Medication Sig Start Date End Date Taking? Authorizing Provider  ondansetron (ZOFRAN-ODT) 4 MG disintegrating tablet Take 1 tablet (4 mg total) by mouth every 8 (eight) hours as needed for nausea or vomiting. 08/16/22  Yes Sharion Balloon, NP  amphetamine-dextroamphetamine (ADDERALL) 20 MG tablet Take 1 tablet (20 mg total) by mouth 2 (two) times daily. 11/20/21   [name], MD  amphetamine-dextroamphetamine  (ADDERALL) 20 MG tablet Take 1 tablet (20 mg total) by mouth 2 (two) times daily. 11/20/21   [name], MD  amphetamine-dextroamphetamine (ADDERALL) 20 MG tablet Take 1 tablet (20 mg total) by mouth 2 (two) times daily. 07/01/22   [name], MD  dicyclomine (BENTYL) 10 MG capsule TAKE 1 CAPSULE BY MOUTH TWICE A DAY 02/17/22   [name], MD  estradiol (ESTRACE) 1 MG tablet TAKE 1 TABLET BY MOUTH EVERY DAY 07/28/22   [name], MD  loratadine (CLARITIN) 10 MG tablet Take 10 mg by mouth in the morning and at bedtime.    [provider]  mometasone (NASONEX) 50 MCG/ACT nasal spray Place 2 sprays into the nose daily.    [provider]  triamcinolone cream (KENALOG) 0.1 % APPLY TO AFFECTED AREA TWICE A DAY 02/09/20   [name], MD    Family History Family History  Problem Relation Age of Onset   Prostate cancer Father    Skin cancer Maternal Grandmother    Heart attack Maternal Grandfather    Hyperlipidemia Paternal Grandfather    Hypertension Paternal Grandfather    Stomach cancer Neg Hx    Esophageal cancer Neg Hx    Colon cancer Neg Hx     Social History Social History   Tobacco Use   Smoking status: Never   Smokeless tobacco: Never  Vaping Use   Vaping Use: Never used  Substance Use Topics   Alcohol use: Yes    Comment: ocassional   Drug use: No     Allergies   Augmentin [amoxicillin-pot clavulanate]   Review of Systems Review of Systems  Constitutional:  Negative for chills and fever.  HENT:  Negative for ear pain and sore throat.   Respiratory:  Negative for cough and shortness of breath.   Gastrointestinal:  Positive for diarrhea and nausea. Negative for abdominal pain, blood in stool and vomiting.  Genitourinary:  Negative for dysuria and hematuria.  Skin:  Negative for color change and rash.  All other systems reviewed and are negative.    Physical Exam Triage Vital Signs ED Triage Vitals [08/16/22 1435]  Enc  Vitals Group     BP      Pulse Rate 100     Resp 18     Temp 98 F (36.7 C)     Temp src      SpO2 96 %     Weight      Height      Head Circumference      Peak Flow      Pain Score      Pain Loc      Pain Edu?      Excl. in [place]?    No data found.  Updated Vital Signs BP 112/82   Pulse 100   Temp 98 F (36.7 C)   Resp 18   Ht '5\' 2"'$  (1.575 m)   Wt 200 lb (  90.7 kg)   LMP 10/25/2011   SpO2 96%   BMI 36.58 kg/m   Visual Acuity Right Eye Distance:   Left Eye Distance:   Bilateral Distance:    Right Eye Near:   Left Eye Near:    Bilateral Near:     Physical Exam Vitals and nursing note reviewed.  Constitutional:      General: She is not in acute distress.    Appearance: Normal appearance. She is well-developed. She is not ill-appearing.  HENT:     Right Ear: Tympanic membrane normal.     Left Ear: Tympanic membrane normal.     Nose: Nose normal.     Mouth/Throat:     Mouth: Mucous membranes are moist.     Pharynx: Oropharynx is clear.  Cardiovascular:     Rate and Rhythm: Normal rate and regular rhythm.     Heart sounds: Normal heart sounds.  Pulmonary:     Effort: Pulmonary effort is normal. No respiratory distress.     Breath sounds: Normal breath sounds.  Abdominal:     General: Bowel sounds are normal.     Palpations: Abdomen is soft.     Tenderness: There is no abdominal tenderness. There is no guarding or rebound.     Comments: Colostomy with liquid yellow-brown stool.   Musculoskeletal:     Cervical back: Neck supple.  Skin:    General: Skin is warm and dry.  Neurological:     Mental Status: She is alert.  Psychiatric:        Mood and Affect: Mood normal.        Behavior: Behavior normal.      UC Treatments / Results  Labs (all labs ordered are listed, but only abnormal results are displayed) Labs Reviewed - No data to display  EKG   Radiology No results found.  Procedures Procedures (including critical care time)  Medications  Ordered in UC Medications - No data to display  Initial Impression / Assessment and Plan / UC Course  I have reviewed the triage vital signs and the nursing notes.  Pertinent labs & imaging results that were available during my care of the patient were reviewed by me and considered in my medical decision making (see chart for details).   Nausea without vomiting, diarrhea, colostomy.  Treating nausea awith Zofran.  Discussed clear liquid diet.  Instructed patient to advance to diarrhea diet as tolerated.  Discussed maintaining oral hydration at home; ED precautions discussed.  Education provided on nausea and diarrhea.  Instructed patient to follow-up with her PCP as needed.  She agrees to plan of care.    Final Clinical Impressions(s) / UC Diagnoses   Final diagnoses:  Nausea without vomiting  Diarrhea, unspecified type  Colostomy status ([place])     Discharge Instructions      Take the antinausea medication as directed.    Keep yourself hydrated with clear liquids, such as water and Gatorade.  Advance to the diarrhea diet as tolerated.   Go to the emergency department if you have acute worsening symptoms.    Follow up with your primary care provider on Monday.          ED Prescriptions     Medication Sig Dispense Auth. Provider   ondansetron (ZOFRAN-ODT) 4 MG disintegrating tablet Take 1 tablet (4 mg total) by mouth every 8 (eight) hours as needed for nausea or vomiting. 20 tablet Sharion Balloon, NP      PDMP not reviewed  this encounter.   Sharion Balloon, NP 08/16/22 1520

## 2022-08-16 NOTE — ED Triage Notes (Signed)
Patient to Urgent Care with complaints of diarrhea and gas pains. Reports she has been taking imodium but is still having watery stools. Some relief this morning. Also reports nausea. Reports that she does have colostomy with increased output, states it is like water.   Covid and flu/ RSV negative 08/13/22.   Poor appetite. Low energy. Denies any recent fevers (Last fever 101.6 on Wednesday evening).

## 2022-08-20 ENCOUNTER — Ambulatory Visit (INDEPENDENT_AMBULATORY_CARE_PROVIDER_SITE_OTHER): Payer: 59 | Admitting: Internal Medicine

## 2022-08-20 ENCOUNTER — Encounter: Payer: Self-pay | Admitting: Internal Medicine

## 2022-08-20 VITALS — BP 112/76 | HR 85 | Temp 98.1°F | Ht 62.0 in | Wt 196.6 lb

## 2022-08-20 DIAGNOSIS — E781 Pure hyperglyceridemia: Secondary | ICD-10-CM | POA: Diagnosis not present

## 2022-08-20 DIAGNOSIS — R7301 Impaired fasting glucose: Secondary | ICD-10-CM

## 2022-08-20 DIAGNOSIS — K760 Fatty (change of) liver, not elsewhere classified: Secondary | ICD-10-CM

## 2022-08-20 DIAGNOSIS — G4710 Hypersomnia, unspecified: Secondary | ICD-10-CM | POA: Diagnosis not present

## 2022-08-20 DIAGNOSIS — Z0000 Encounter for general adult medical examination without abnormal findings: Secondary | ICD-10-CM | POA: Diagnosis not present

## 2022-08-20 DIAGNOSIS — Z79899 Other long term (current) drug therapy: Secondary | ICD-10-CM | POA: Diagnosis not present

## 2022-08-20 DIAGNOSIS — R197 Diarrhea, unspecified: Secondary | ICD-10-CM | POA: Diagnosis not present

## 2022-08-20 DIAGNOSIS — E876 Hypokalemia: Secondary | ICD-10-CM

## 2022-08-20 MED ORDER — AMPHETAMINE-DEXTROAMPHETAMINE 20 MG PO TABS: 20.0000 mg | ORAL_TABLET | Freq: Two times a day (BID) | ORAL | 0 refills | Status: DC

## 2022-08-20 MED ORDER — CIPROFLOXACIN HCL 500 MG PO TABS: 500.0000 mg | ORAL_TABLET | Freq: Two times a day (BID) | ORAL | 0 refills | Status: AC

## 2022-08-20 MED ORDER — METRONIDAZOLE 500 MG PO TABS: 500.0000 mg | ORAL_TABLET | Freq: Four times a day (QID) | ORAL | 0 refills | Status: AC

## 2022-08-20 NOTE — Patient Instructions (Signed)
Take the stool collection kit to [facility] and go to lab through  the medical mall entrance   If you start the metronidazole,  do not drink any alcohol within 24 hours of the last dose or you will have a terrible antabuse effect (hangover from hell)  Go ahead and start the cipro either way  Hydrate using pedialyte or other NON SUGARFREE  electolyte replacement drink.  (Suspend all artificial sweeteners)  Modify diet:  no fresh fruits or veggies.  [place] and potatoes fine. Think bland

## 2022-08-20 NOTE — Progress Notes (Unsigned)
The patient is here for annual preventive examination and management of other chronic and acute problems.   The risk factors are reflected in the social history.   The roster of all physicians providing medical care to patient - is listed in the Snapshot section of the chart.   Activities of daily living:  The patient is 100% independent in all ADLs: dressing, toileting, feeding as well as independent mobility   Home safety : The patient has smoke detectors in the home. They wear seatbelts.  There are no unsecured firearms at home. There is no violence in the home.    There is no risks for hepatitis, STDs or HIV. There is no   history of blood transfusion. They have no travel history to infectious disease endemic areas of the world.   The patient has seen their dentist in the last six month. They have seen their eye doctor in the last year. The patinet  denies slight hearing difficulty with regard to whispered voices and some television programs.  They have deferred audiologic testing in the last year.  They do not  have excessive sun exposure. Discussed the need for sun protection: hats, long sleeves and use of sunscreen if there is significant sun exposure.    Diet: the importance of a healthy diet is discussed. They do have a healthy diet.   The benefits of regular aerobic exercise were discussed. The patient  exercises  3 to 5 days per week  for  60 minutes.    Depression screen: there are no signs or vegative symptoms of depression- irritability, change in appetite, anhedonia, sadness/tearfullness.   The following portions of the patient's history were reviewed and updated as appropriate: allergies, current medications, past family history, past medical history,  past surgical history, past social history  and problem list.   Visual acuity was not assessed per patient preference since the patient has regular follow up with an  ophthalmologist. Hearing and body mass index were assessed and  reviewed.    During the course of the visit the patient was educated and counseled about appropriate screening and preventive services including : fall prevention , diabetes screening, nutrition counseling, colorectal cancer screening, and recommended immunizations.    Chief Complaint:  Diarrhea accompanied by chills and body aches  for the past week   started last Tuesday  with change semi solid to liquid. And had Body aches,  headache.  Urgent care ruled out flue and COVID. No recent travel, no recent use of antibiotics.  No blood in stool output.  (Has a transparent colostomy bag) no abdominal cramping or pain.  Lots of gas pains.  Some nausea and decreased appetite.  Ln Thursday had a fever of 101.6 and none since. For the e past 4-5 days has been watery and  copious. Has been using imodium for the past  several days but it has not helped  . Monday she had very high out put ,  ate a banana which slowed it down transiently.  Some left sided low back pain     Review of Symptoms  Patient denies headache, fevers, malaise, unintentional weight loss, skin rash, eye pain, sinus congestion and sinus pain, sore throat, dysphagia,  hemoptysis , cough, dyspnea, wheezing, chest pain, palpitations, orthopnea, edema, abdominal pain, nausea, melena, diarrhea, constipation, flank pain, dysuria, hematuria, urinary  Frequency, nocturia, numbness, tingling, seizures,  Focal weakness, Loss of consciousness,  Tremor, insomnia, depression, anxiety, and suicidal ideation.    Physical Exam:  BP 112/76 (BP Location: Left Arm, Patient Position: Sitting, Cuff Size: Large)   Pulse 85   Temp 98.1 F (36.7 C) (Oral)   Ht '5\' 2"'$  (1.575 m)   Wt 196 lb 9.6 oz (89.2 kg)   LMP 10/25/2011   SpO2 98%   BMI 35.96 kg/m      Assessment and Plan:  No problem-specific Assessment & Plan notes found for this encounter.   Updated Medication List Outpatient Encounter Medications as of 08/20/2022  Medication Sig    amphetamine-dextroamphetamine (ADDERALL) 20 MG tablet Take 1 tablet (20 mg total) by mouth 2 (two) times daily.   amphetamine-dextroamphetamine (ADDERALL) 20 MG tablet Take 1 tablet (20 mg total) by mouth 2 (two) times daily.   amphetamine-dextroamphetamine (ADDERALL) 20 MG tablet Take 1 tablet (20 mg total) by mouth 2 (two) times daily.   dicyclomine (BENTYL) 10 MG capsule TAKE 1 CAPSULE BY MOUTH TWICE A DAY   estradiol (ESTRACE) 1 MG tablet TAKE 1 TABLET BY MOUTH EVERY DAY   loratadine (CLARITIN) 10 MG tablet Take 10 mg by mouth in the morning and at bedtime.   mometasone (NASONEX) 50 MCG/ACT nasal spray Place 2 sprays into the nose daily.   ondansetron (ZOFRAN-ODT) 4 MG disintegrating tablet Take 1 tablet (4 mg total) by mouth every 8 (eight) hours as needed for nausea or vomiting.   triamcinolone cream (KENALOG) 0.1 % APPLY TO AFFECTED AREA TWICE A DAY   No facility-administered encounter medications on file as of 08/20/2022.

## 2022-08-20 NOTE — Assessment & Plan Note (Signed)
Sending to [facility] for stool studies to rule out bacterial and viral causes  including  c dif.   Given rx for metronidazole and cipro to start pending collection of stool

## 2022-08-21 DIAGNOSIS — E876 Hypokalemia: Secondary | ICD-10-CM | POA: Insufficient documentation

## 2022-08-21 LAB — LIPID PANEL
Chol/HDL Ratio: 3.2 ratio (ref 0.0–4.4)
Cholesterol, Total: 139 mg/dL (ref 100–199)
HDL: 44 mg/dL (ref >39)
LDL Chol Calc (NIH): 59 mg/dL (ref 0–99)
Triglycerides: 224 mg/dL — ABNORMAL HIGH (ref 0–149)
VLDL Cholesterol Cal: 36 mg/dL (ref 5–40)

## 2022-08-21 LAB — CBC WITH DIFFERENTIAL/PLATELET
Basophils Absolute: 0.1 10*3/uL (ref 0.0–0.2)
Basos: 1 %
EOS (ABSOLUTE): 0.1 10*3/uL (ref 0.0–0.4)
Eos: 1 %
Hematocrit: 41.3 % (ref 34.0–46.6)
Hemoglobin: 14.7 g/dL (ref 11.1–15.9)
Immature Grans (Abs): 0.1 10*3/uL (ref 0.0–0.1)
Immature Granulocytes: 1 %
Lymphocytes Absolute: 2.8 10*3/uL (ref 0.7–3.1)
Lymphs: 41 %
MCH: 30.1 pg (ref 26.6–33.0)
MCHC: 35.6 g/dL (ref 31.5–35.7)
MCV: 85 fL (ref 79–97)
Monocytes Absolute: 0.4 10*3/uL (ref 0.1–0.9)
Monocytes: 6 %
Neutrophils Absolute: 3.4 10*3/uL (ref 1.4–7.0)
Neutrophils: 50 %
Platelets: 257 10*3/uL (ref 150–450)
RBC: 4.89 x10E6/uL (ref 3.77–5.28)
RDW: 12.5 % (ref 11.7–15.4)
WBC: 6.8 10*3/uL (ref 3.4–10.8)

## 2022-08-21 LAB — URINALYSIS, ROUTINE W REFLEX MICROSCOPIC
Bilirubin, UA: NEGATIVE
Glucose, UA: NEGATIVE
Ketones, UA: NEGATIVE
Leukocytes,UA: NEGATIVE
Nitrite, UA: NEGATIVE
Protein,UA: NEGATIVE
RBC, UA: NEGATIVE
Specific Gravity, UA: 1.01 (ref 1.005–1.030)
Urobilinogen, Ur: 0.2 mg/dL (ref 0.2–1.0)
pH, UA: 6.5 (ref 5.0–7.5)

## 2022-08-21 LAB — COMPREHENSIVE METABOLIC PANEL
ALT: 45 IU/L — ABNORMAL HIGH (ref 0–32)
AST: 19 IU/L (ref 0–40)
Albumin/Globulin Ratio: 2 (ref 1.2–2.2)
Albumin: 4.4 g/dL (ref 3.9–4.9)
Alkaline Phosphatase: 89 IU/L (ref 44–121)
BUN/Creatinine Ratio: 13 (ref 9–23)
BUN: 12 mg/dL (ref 6–20)
Bilirubin Total: 0.5 mg/dL (ref 0.0–1.2)
CO2: 21 mmol/L (ref 20–29)
Calcium: 9.2 mg/dL (ref 8.7–10.2)
Chloride: 100 mmol/L (ref 96–106)
Creatinine, Ser: 0.91 mg/dL (ref 0.57–1.00)
Globulin, Total: 2.2 g/dL (ref 1.5–4.5)
Glucose: 111 mg/dL — ABNORMAL HIGH (ref 70–99)
Potassium: 2.9 mmol/L — ABNORMAL LOW (ref 3.5–5.2)
Sodium: 138 mmol/L (ref 134–144)
Total Protein: 6.6 g/dL (ref 6.0–8.5)
eGFR: 85 mL/min/{1.73_m2} (ref >59)

## 2022-08-21 LAB — TSH: TSH: 1.55 u[IU]/mL (ref 0.450–4.500)

## 2022-08-21 LAB — LDL CHOLESTEROL, DIRECT: LDL Direct: 56 mg/dL (ref 0–99)

## 2022-08-21 LAB — HEMOGLOBIN A1C
Est. average glucose Bld gHb Est-mCnc: 117 mg/dL
Hgb A1c MFr Bld: 5.7 % — ABNORMAL HIGH (ref 4.8–5.6)

## 2022-08-21 MED ORDER — MAGNESIUM OXIDE 250 MG PO TABS: 1.0000 | ORAL_TABLET | Freq: Two times a day (BID) | ORAL | 0 refills | Status: DC

## 2022-08-21 MED ORDER — POTASSIUM CHLORIDE CRYS ER 10 MEQ PO TBCR: 10.0000 meq | EXTENDED_RELEASE_TABLET | Freq: Two times a day (BID) | ORAL | 0 refills | Status: DC

## 2022-08-21 NOTE — Assessment & Plan Note (Signed)
Likely low in magnesium as well given history of diarrhea  1 week.

## 2022-08-21 NOTE — Assessment & Plan Note (Signed)

## 2022-08-21 NOTE — Assessment & Plan Note (Signed)
Presumed by ultrasound changes and negative serologies to rule out autoimmune causes of hepatitis.  Current liver enzymes are mildly elevated, possbiy due to diarrhea illness , as they have been  normal  since eb 2020.  all modifiable risk factors including obesity, screening for diabetes and hyperlipidemia have been addressed   Lab Results  Component Value Date   ALT 45 (H) 08/20/2022   AST 19 08/20/2022   ALKPHOS 89 08/20/2022   BILITOT 0.5 08/20/2022

## 2022-08-21 NOTE — Assessment & Plan Note (Signed)
Managed with  short acting methylphenidate .  Refills given following review of Refill history confirmed via [place] Controlled Substance databas, accessed by me today.Marland Kitchen

## 2022-08-23 LAB — URINE CULTURE

## 2022-08-25 ENCOUNTER — Encounter: Payer: Self-pay | Admitting: Internal Medicine

## 2022-08-25 ENCOUNTER — Other Ambulatory Visit
Admission: RE | Admit: 2022-08-25 | Discharge: 2022-08-25 | Disposition: A | Discharge status: Home / Self Care | Payer: 59 | Source: Ambulatory Visit | Attending: Internal Medicine | Admitting: Internal Medicine

## 2022-08-25 DIAGNOSIS — R197 Diarrhea, unspecified: Secondary | ICD-10-CM | POA: Diagnosis present

## 2022-08-25 DIAGNOSIS — K521 Toxic gastroenteritis and colitis: Secondary | ICD-10-CM | POA: Diagnosis not present

## 2022-08-25 LAB — GASTROINTESTINAL PANEL BY PCR, STOOL (REPLACES STOOL CULTURE)

## 2022-08-25 LAB — C DIFFICILE QUICK SCREEN W PCR REFLEX
C Diff antigen: NEGATIVE
C Diff interpretation: NOT DETECTED
C Diff toxin: NEGATIVE

## 2022-08-28 LAB — GIARDIA, EIA; OVA/PARASITE: Giardia Ag, Stl: NEGATIVE

## 2022-08-28 LAB — NOROVIRUS GROUP 1 & 2 BY PCR, STOOL
Norovirus 1 by PCR: NEGATIVE
Norovirus 2  by PCR: NEGATIVE

## 2022-08-28 LAB — O&P RESULT

## 2022-08-29 LAB — STOOL CULTURE: E coli, Shiga toxin Assay: NEGATIVE

## 2022-08-29 LAB — STOOL CULTURE REFLEX - CMPCXR

## 2022-08-29 LAB — STOOL CULTURE REFLEX - RSASHR

## 2022-08-31 ENCOUNTER — Other Ambulatory Visit: Payer: Self-pay | Admitting: Internal Medicine

## 2022-09-04 ENCOUNTER — Other Ambulatory Visit: Payer: Self-pay | Admitting: Internal Medicine

## 2022-09-10 ENCOUNTER — Other Ambulatory Visit (INDEPENDENT_AMBULATORY_CARE_PROVIDER_SITE_OTHER): Payer: 59

## 2022-09-10 DIAGNOSIS — E781 Pure hyperglyceridemia: Secondary | ICD-10-CM

## 2022-09-10 DIAGNOSIS — E876 Hypokalemia: Secondary | ICD-10-CM

## 2022-09-10 LAB — COMPREHENSIVE METABOLIC PANEL
ALT: 28 U/L (ref 0–35)
AST: 18 U/L (ref 0–37)
Albumin: 4.1 g/dL (ref 3.5–5.2)
Alkaline Phosphatase: 72 U/L (ref 39–117)
BUN: 11 mg/dL (ref 6–23)
CO2: 26 mEq/L (ref 19–32)
Calcium: 8.9 mg/dL (ref 8.4–10.5)
Chloride: 103 mEq/L (ref 96–112)
Creatinine, Ser: 0.88 mg/dL (ref 0.40–1.20)
GFR: 85.5 mL/min (ref >60.00)
Glucose, Bld: 91 mg/dL (ref 70–99)
Potassium: 3.8 mEq/L (ref 3.5–5.1)
Sodium: 138 mEq/L (ref 135–145)
Total Bilirubin: 0.7 mg/dL (ref 0.2–1.2)
Total Protein: 6.2 g/dL (ref 6.0–8.3)

## 2022-09-10 LAB — MAGNESIUM: Magnesium: 1.8 mg/dL (ref 1.5–2.5)

## 2022-09-10 NOTE — Telephone Encounter (Signed)
MyChart messgae sent to patient. 

## 2022-09-29 DIAGNOSIS — K56609 Unspecified intestinal obstruction, unspecified as to partial versus complete obstruction: Secondary | ICD-10-CM

## 2022-09-29 HISTORY — DX: Unspecified intestinal obstruction, unspecified as to partial versus complete obstruction: K56.609

## 2022-09-30 DIAGNOSIS — Z933 Colostomy status: Secondary | ICD-10-CM | POA: Diagnosis not present

## 2022-10-02 DIAGNOSIS — C20 Malignant neoplasm of rectum: Secondary | ICD-10-CM | POA: Diagnosis not present

## 2022-10-02 DIAGNOSIS — Z933 Colostomy status: Secondary | ICD-10-CM | POA: Diagnosis not present

## 2022-10-06 DIAGNOSIS — Z933 Colostomy status: Secondary | ICD-10-CM | POA: Diagnosis not present

## 2022-10-10 ENCOUNTER — Ambulatory Visit (AMBULATORY_SURGERY_CENTER): Payer: BC Managed Care – PPO

## 2022-10-10 VITALS — Ht 61.5 in | Wt 200.0 lb

## 2022-10-10 DIAGNOSIS — Z86010 Personal history of colonic polyps: Secondary | ICD-10-CM

## 2022-10-10 DIAGNOSIS — Z85048 Personal history of other malignant neoplasm of rectum, rectosigmoid junction, and anus: Secondary | ICD-10-CM

## 2022-10-10 NOTE — Progress Notes (Signed)
Pre visit completed via phone call; Patient verified name, DOB, and address;  No egg or soy allergy known to patient  No issues known to pt with past sedation with any surgeries or procedures Patient denies ever being told they had issues or difficulty with intubation  No FH of Malignant Hyperthermia Pt is not on diet pills Pt is not on home 02  Pt is not on blood thinners  Pt reports issues with constipation - currently taking stool softener to assist with having more of a loose bowel movement due to having colostomy and needing to be "comfortable at work" No A fib or A flutter Have any cardiac testing pending--NO Pt instructed to use Singlecare.com or GoodRx for a price reduction on prep  Insurance verified during [place] appt=BCBS/A[employer]  Patient's chart reviewed by Osvaldo Angst CRNA prior to previsit and patient appropriate for the [facility].  Previsit completed and red dot placed by patient's name on their procedure day (on provider's schedule).

## 2022-10-29 ENCOUNTER — Other Ambulatory Visit: Payer: Self-pay | Admitting: Internal Medicine

## 2022-10-29 NOTE — Telephone Encounter (Signed)
Prescription Request  10/29/2022  Is this a "Controlled Substance" medicine? Yes  LOV: 08/20/2022  What is the name of the medication or equipment? ADDERALL 20 MG tablet  Have you contacted your pharmacy to request a refill? Yes   Which pharmacy would you like this sent to?   [id number]Phone: [phone]: [phone]   Patient notified that their request is being sent to the clinical staff for review and that they should receive a response within 2 business days.   Please advise at Mobile [phone](mobile)

## 2022-10-29 NOTE — Telephone Encounter (Signed)
Historical provider.   Last OV: 08/20/2022 Next OV: not scheduled

## 2022-10-30 MED ORDER — ADDERALL 20 MG PO TABS: 20.0000 mg | ORAL_TABLET | Freq: Two times a day (BID) | ORAL | 0 refills | Status: DC

## 2022-10-31 ENCOUNTER — Encounter: Payer: Self-pay | Admitting: Internal Medicine

## 2022-10-31 ENCOUNTER — Ambulatory Visit (AMBULATORY_SURGERY_CENTER): Payer: BC Managed Care – PPO | Admitting: Internal Medicine

## 2022-10-31 VITALS — BP 113/78 | HR 78 | Temp 98.8°F | Resp 17 | Ht 61.5 in | Wt 200.0 lb

## 2022-10-31 DIAGNOSIS — Z85048 Personal history of other malignant neoplasm of rectum, rectosigmoid junction, and anus: Secondary | ICD-10-CM | POA: Diagnosis not present

## 2022-10-31 DIAGNOSIS — Z86010 Personal history of colonic polyps: Secondary | ICD-10-CM | POA: Diagnosis not present

## 2022-10-31 DIAGNOSIS — Z08 Encounter for follow-up examination after completed treatment for malignant neoplasm: Secondary | ICD-10-CM | POA: Diagnosis not present

## 2022-10-31 DIAGNOSIS — D122 Benign neoplasm of ascending colon: Secondary | ICD-10-CM

## 2022-10-31 MED ORDER — SODIUM CHLORIDE 0.9 % IV SOLN: 500.0000 mL | Freq: Once | INTRAVENOUS | Status: DC

## 2022-10-31 NOTE — Op Note (Signed)
[facility] Patient Name: Rachael Cowan Procedure Date: 10/31/2022 8:03 AM MRN: [id number] Endoscopist: Adline Mango [place] , , [id number] Age: 37 Referring MD:  Date of Birth: [birth year] Gender: Female Account #: [id number] Procedure:                Colonoscopy Indications:              High risk colon cancer surveillance: Personal                            history of rectal cancer Medicines:                Monitored Anesthesia Care Procedure:                Pre-Anesthesia Assessment:                           - Prior to the procedure, a History and Physical                            was performed, and patient medications and                            allergies were reviewed. The patient's tolerance of                            previous anesthesia was also reviewed. The risks                            and benefits of the procedure and the sedation                            options and risks were discussed with the patient.                            All questions were answered, and informed consent                            was obtained. Prior Anticoagulants: The patient has                            taken no anticoagulant or antiplatelet agents. ASA                            Grade Assessment: II - A patient with mild systemic                            disease. After reviewing the risks and benefits,                            the patient was deemed in satisfactory condition to                            undergo the procedure.  After obtaining informed consent, the colonoscope                            was passed under direct vision. Throughout the                            procedure, the patient's blood pressure, pulse, and                            oxygen saturations were monitored continuously. The                            CF [id number] #[id number] was introduced through the                            sigmoid colostomy and advanced to the  the cecum,                            identified by the appendiceal orifice, ileocecal                            valve and palpation. The colonoscopy was performed                            without difficulty. The patient tolerated the                            procedure well. The quality of the bowel                            preparation was adequate. The terminal ileum,                            ileocecal valve, appendiceal orifice, and rectum                            were photographed. Scope In: 8:26:24 AM Scope Out: 8:47:26 AM Scope Withdrawal Time: 0 hours 11 minutes 49 seconds  Total Procedure Duration: 0 hours 21 minutes 2 seconds  Findings:                 The terminal ileum appeared normal.                           A 4 mm polyp was found in the ascending colon. The                            polyp was sessile. The polyp was removed with a                            cold snare. Resection and retrieval were complete.                           A localized area of friable mucosa with contact  bleeding was found at the surgical stoma. Complications:            No immediate complications. Estimated Blood Loss:     Estimated blood loss was minimal. Impression:               - The examined portion of the ileum was normal.                           - One 4 mm polyp in the ascending colon, removed                            with a cold snare. Resected and retrieved.                           - Friability with contact bleeding at the surgical                            stoma. Recommendation:           - Discharge patient to home (with escort).                           - Await pathology results.                           - The findings and recommendations were discussed                            with the patient. Dr Georgian Co "Lyndee Leo" Welcome,  10/31/2022 8:57:17 AM

## 2022-10-31 NOTE — Progress Notes (Signed)
Called to room to assist during endoscopic procedure.  Patient ID and intended procedure confirmed with present staff. Received instructions for my participation in the procedure from the performing physician.  

## 2022-10-31 NOTE — Progress Notes (Signed)
Pt's states no medical or surgical changes since previsit or office visit. VS assessed by D.T 

## 2022-10-31 NOTE — Progress Notes (Signed)
GASTROENTEROLOGY PROCEDURE H&P NOTE   Primary Care Physician: [name], MD    Reason for Procedure:   History of rectal cancer  Plan:    Colonoscopy  Patient is appropriate for endoscopic procedure(s) in the ambulatory ([facility]) setting.  The nature of the procedure, as well as the risks, benefits, and alternatives were carefully and thoroughly reviewed with the patient. Ample time for discussion and questions allowed. The patient understood, was satisfied, and agreed to proceed.     HPI: Rachael Cowan is a 37 y.o. female who presents for colonoscopy for evaluation of history of rectal cancer .  Patient was most recently seen in the Gastroenterology Clinic on 06/11/22.  No interval change in medical history since that appointment. Please refer to that note for full details regarding GI history and clinical presentation.   Past Medical History:  Diagnosis Date   Allergy    Asthma    exercise induced/allergy induced   Bowel obstruction ([place]) 2024   possible?- being evaluated bi GI/sx   Colon polyp    Gallstones    Hypersomnolence disorder 2005   managed with metidate   Obesity    Rectal cancer ([place]) 05/13/2012   Resection with colostomy and hysterectomy(preventative)   Sleep apnea 2005   resolved with ENT surgery,  [name]    Past Surgical History:  Procedure Laterality Date   ABDOMINAL HYSTERECTOMY  2013   ARTHROSCOPIC REPAIR ACL  2006   right knee Dr Donna Christen at [facility] 07/21/2016   Procedure: [place] TO OPEN;  Surgeon: Olean Ree, MD;  Location: [facility];  Service: General;  Laterality: N/A;   COLON SURGERY  2013   COLONOSCOPY WITH PROPOFOL N/A 12/14/2015   Procedure: COLONOSCOPY WITH PROPOFOL;  Surgeon: Manya Silvas, MD;  Location: [facility];  Service: Endoscopy;  Laterality: N/A;   Colostomy Placement  2013   PORTA CATH REMOVAL N/A 11/06/2016   Procedure: [place] Cath Removal;  Surgeon: Algernon Huxley, MD;  Location: [facility];  Service: Cardiovascular;  Laterality: N/A;   TONSILLECTOMY AND ADENOIDECTOMY  6568   UMBILICAL HERNIA REPAIR  06/2016    Prior to Admission medications   Medication Sig Start Date End Date Taking? Authorizing Provider  ADDERALL 20 MG tablet Take 1 tablet (20 mg total) by mouth 2 (two) times daily. 10/30/22  Yes [name], MD  dicyclomine (BENTYL) 10 MG capsule TAKE 1 CAPSULE BY MOUTH TWICE A DAY Patient taking differently: Take 10 mg by mouth 2 (two) times daily as needed for spasms. 09/05/22  Yes [name], MD  Docusate Calcium (STOOL SOFTENER PO) Take 1 capsule by mouth daily as needed (constiaption).   Yes [provider]  estradiol (ESTRACE) 1 MG tablet TAKE 1 TABLET BY MOUTH EVERY DAY 09/03/22  Yes [name], MD  loratadine (CLARITIN) 10 MG tablet Take 10 mg by mouth daily.    [provider]  mometasone (NASONEX) 50 MCG/ACT nasal spray Place 2 sprays into the nose daily.    [provider]  triamcinolone cream (KENALOG) 0.1 % APPLY TO AFFECTED AREA TWICE A DAY Patient taking differently: Apply 1 Application topically daily as needed. 02/09/20   [name], MD    Current Outpatient Medications  Medication Sig Dispense Refill   ADDERALL 20 MG tablet Take 1 tablet (20 mg total) by mouth 2 (two) times daily. 60 tablet 0   dicyclomine (BENTYL) 10 MG capsule  TAKE 1 CAPSULE BY MOUTH TWICE A DAY (Patient taking differently: Take 10 mg by mouth 2 (two) times daily as needed for spasms.) 60 capsule 5   Docusate Calcium (STOOL SOFTENER PO) Take 1 capsule by mouth daily as needed (constiaption).     estradiol (ESTRACE) 1 MG tablet TAKE 1 TABLET BY MOUTH EVERY DAY 90 tablet 1   loratadine (CLARITIN) 10 MG tablet Take 10 mg by mouth daily.     mometasone (NASONEX) 50 MCG/ACT nasal spray Place 2 sprays into the nose daily.     triamcinolone cream (KENALOG) 0.1 % APPLY TO AFFECTED AREA TWICE A DAY (Patient taking  differently: Apply 1 Application topically daily as needed.) 30 g 0   Current Facility-Administered Medications  Medication Dose Route Frequency Provider Last Rate Last Admin   0.9 %  sodium chloride infusion  500 mL Intravenous Once Sharyn Creamer, MD        Allergies as of 10/31/2022 - Review Complete 10/31/2022  Allergen Reaction Noted   Augmentin [amoxicillin-pot clavulanate] Rash 07/30/2016    Family History  Problem Relation Age of Onset   Prostate cancer Father    Diverticulitis Father    Alcoholism Father    Skin cancer Maternal Grandmother    Heart attack Maternal Grandfather    Hyperlipidemia Paternal Grandfather    Hypertension Paternal Grandfather    Stomach cancer Neg Hx    Esophageal cancer Neg Hx    Colon cancer Neg Hx    Colon polyps Neg Hx    Rectal cancer Neg Hx     Social History   Socioeconomic History   Marital status: Single    Spouse name: Not on file   Number of children: 0   Years of education: Not on file   Highest education level: Not on file  Occupational History   Occupation: Training and development officer   Occupation: Social worker  Tobacco Use   Smoking status: Never   Smokeless tobacco: Never  Vaping Use   Vaping Use: Never used  Substance and Sexual Activity   Alcohol use: Not Currently    Alcohol/week: 0.0 - 3.0 standard drinks of alcohol    Comment: ocassional   Drug use: No   Sexual activity: Never  Other Topics Concern   Not on file  Social History Narrative   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      She is not sexually active because she has a permanent colostomy    Social Determinants of Radio broadcast assistant Strain: Not on file  Food Insecurity: Not on file  Transportation Needs: Not on file  Physical Activity: Not on file  Stress: Not on file  Social Connections: Not on file  Intimate Partner Violence: Not on file    Physical Exam: Vital  signs in last 24 hours: BP 104/70   Pulse 91   Temp 98.8 F (37.1 C) (Skin)   Ht 5' 1.5" (1.562 m)   Wt 200 lb (90.7 kg)   LMP 10/25/2011   SpO2 97%   BMI 37.18 kg/m  GEN: NAD EYE: Sclerae anicteric ENT: MMM CV: Non-tachycardic Pulm: No increased WOB GI: Soft NEURO:  Alert & Oriented   Christia Reading, MD [facility]   10/31/2022 8:02 AM

## 2022-10-31 NOTE — Progress Notes (Signed)
Report to pacu rn. Vss. Care resumed by rn. 

## 2022-10-31 NOTE — Patient Instructions (Signed)
Thank you for coming in to see [place] today! Resume your diet  and medications today. Return to regular daily activities tomorrow. Recommendations in  dietary adjustments as discussed with Dr Lorenso Courier.    YOU HAD AN ENDOSCOPIC PROCEDURE TODAY AT [facility]:   Refer to the procedure report that was given to you for any specific questions about what was found during the examination.  If the procedure report does not answer your questions, please call your gastroenterologist to clarify.  If you requested that your care partner not be given the details of your procedure findings, then the procedure report has been included in a sealed envelope for you to review at your convenience later.  YOU SHOULD EXPECT: Some feelings of bloating in the abdomen. Passage of more gas than usual.  Walking can help get rid of the air that was put into your GI tract during the procedure and reduce the bloating. If you had a lower endoscopy (such as a colonoscopy or flexible sigmoidoscopy) you may notice spotting of blood in your stool or on the toilet paper. If you underwent a bowel prep for your procedure, you may not have a normal bowel movement for a few days.  Please Note:  You might notice some irritation and congestion in your nose or some drainage.  This is from the oxygen used during your procedure.  There is no need for concern and it should clear up in a day or so.  SYMPTOMS TO REPORT IMMEDIATELY:  Following lower endoscopy (colonoscopy or flexible sigmoidoscopy):  Excessive amounts of blood in the stool  Significant tenderness or worsening of abdominal pains  Swelling of the abdomen that is new, acute  Fever of 100F or higher    For urgent or emergent issues, a gastroenterologist can be reached at any hour by calling [phone]. Do not use MyChart messaging for urgent concerns.    DIET:  We do recommend a small meal at first, but then you may proceed to your regular diet.  Drink  plenty of fluids but you should avoid alcoholic beverages for 24 hours.  ACTIVITY:  You should plan to take it easy for the rest of today and you should NOT DRIVE or use heavy machinery until tomorrow (because of the sedation medicines used during the test).    FOLLOW UP: Our staff will call the number listed on your records the next business day following your procedure.  We will call around 7:15- 8:00 am to check on you and address any questions or concerns that you may have regarding the information given to you following your procedure. If we do not reach you, we will leave a message.     If any biopsies were taken you will be contacted by phone or by letter within the next 1-3 weeks.  Please call us at [phone] if you have not heard about the biopsies in 3 weeks.    SIGNATURES/CONFIDENTIALITY: You and/or your care partner have signed paperwork which will be entered into your electronic medical record.  These signatures attest to the fact that that the information above on your After Visit Summary has been reviewed and is understood.  Full responsibility of the confidentiality of this discharge information lies with you and/or your care-partner.

## 2022-11-03 ENCOUNTER — Telehealth: Payer: Self-pay | Admitting: *Deleted

## 2022-11-03 NOTE — Telephone Encounter (Signed)
Follow up call attempt.  LVM to call if any questions or concerns. 

## 2022-11-04 ENCOUNTER — Other Ambulatory Visit: Payer: 59

## 2022-11-04 ENCOUNTER — Encounter: Payer: Self-pay | Admitting: Internal Medicine

## 2022-11-06 ENCOUNTER — Ambulatory Visit: Payer: 59 | Admitting: Oncology

## 2022-12-06 ENCOUNTER — Other Ambulatory Visit: Payer: Self-pay | Admitting: Internal Medicine

## 2022-12-08 ENCOUNTER — Other Ambulatory Visit: Payer: Self-pay | Admitting: Internal Medicine

## 2022-12-09 ENCOUNTER — Inpatient Hospital Stay: Payer: BC Managed Care – PPO | Attending: Oncology

## 2022-12-09 DIAGNOSIS — Z85048 Personal history of other malignant neoplasm of rectum, rectosigmoid junction, and anus: Secondary | ICD-10-CM | POA: Insufficient documentation

## 2022-12-09 DIAGNOSIS — Z9221 Personal history of antineoplastic chemotherapy: Secondary | ICD-10-CM | POA: Diagnosis not present

## 2022-12-09 DIAGNOSIS — Z08 Encounter for follow-up examination after completed treatment for malignant neoplasm: Secondary | ICD-10-CM | POA: Insufficient documentation

## 2022-12-09 DIAGNOSIS — Z923 Personal history of irradiation: Secondary | ICD-10-CM | POA: Insufficient documentation

## 2022-12-09 LAB — CBC WITH DIFFERENTIAL (CANCER CENTER ONLY)
Abs Immature Granulocytes: 0.04 10*3/uL (ref 0.00–0.07)
Basophils Absolute: 0.1 10*3/uL (ref 0.0–0.1)
Basophils Relative: 1 %
Eosinophils Absolute: 0.1 10*3/uL (ref 0.0–0.5)
Eosinophils Relative: 3 %
HCT: 36.9 % (ref 36.0–46.0)
Hemoglobin: 12.8 g/dL (ref 12.0–15.0)
Immature Granulocytes: 1 %
Lymphocytes Relative: 40 %
Lymphs Abs: 1.7 10*3/uL (ref 0.7–4.0)
MCH: 30.5 pg (ref 26.0–34.0)
MCHC: 34.7 g/dL (ref 30.0–36.0)
MCV: 88.1 fL (ref 80.0–100.0)
Monocytes Absolute: 0.3 10*3/uL (ref 0.1–1.0)
Monocytes Relative: 8 %
Neutro Abs: 2.1 10*3/uL (ref 1.7–7.7)
Neutrophils Relative %: 47 %
Platelet Count: 180 10*3/uL (ref 150–400)
RBC: 4.19 MIL/uL (ref 3.87–5.11)
RDW: 13.2 % (ref 11.5–15.5)
WBC Count: 4.4 10*3/uL (ref 4.0–10.5)
nRBC: 0 % (ref 0.0–0.2)

## 2022-12-09 LAB — CMP (CANCER CENTER ONLY)
ALT: 24 U/L (ref 0–44)
AST: 19 U/L (ref 15–41)
Albumin: 3.5 g/dL (ref 3.5–5.0)
Alkaline Phosphatase: 66 U/L (ref 38–126)
Anion gap: 8 (ref 5–15)
BUN: 13 mg/dL (ref 6–20)
CO2: 24 mmol/L (ref 22–32)
Calcium: 8.7 mg/dL — ABNORMAL LOW (ref 8.9–10.3)
Chloride: 102 mmol/L (ref 98–111)
Creatinine: 0.82 mg/dL (ref 0.44–1.00)
GFR, Estimated: >60 mL/min (ref >60)
Glucose, Bld: 103 mg/dL — ABNORMAL HIGH (ref 70–99)
Potassium: 3.8 mmol/L (ref 3.5–5.1)
Sodium: 134 mmol/L — ABNORMAL LOW (ref 135–145)
Total Bilirubin: 0.3 mg/dL (ref 0.3–1.2)
Total Protein: 6.7 g/dL (ref 6.5–8.1)

## 2022-12-09 MED ORDER — ADDERALL 20 MG PO TABS: 20.0000 mg | ORAL_TABLET | Freq: Two times a day (BID) | ORAL | 0 refills | Status: DC

## 2022-12-10 LAB — CEA: CEA: 1.4 ng/mL (ref 0.0–4.7)

## 2022-12-12 ENCOUNTER — Encounter: Payer: Self-pay | Admitting: Oncology

## 2022-12-12 ENCOUNTER — Inpatient Hospital Stay: Payer: BC Managed Care – PPO | Admitting: Oncology

## 2022-12-12 VITALS — BP 116/84 | HR 65 | Temp 97.5°F | Resp 18 | Wt 205.8 lb

## 2022-12-12 DIAGNOSIS — Z08 Encounter for follow-up examination after completed treatment for malignant neoplasm: Secondary | ICD-10-CM | POA: Diagnosis not present

## 2022-12-12 DIAGNOSIS — Z85048 Personal history of other malignant neoplasm of rectum, rectosigmoid junction, and anus: Secondary | ICD-10-CM | POA: Diagnosis not present

## 2022-12-12 DIAGNOSIS — Z9221 Personal history of antineoplastic chemotherapy: Secondary | ICD-10-CM | POA: Diagnosis not present

## 2022-12-12 DIAGNOSIS — Z923 Personal history of irradiation: Secondary | ICD-10-CM | POA: Diagnosis not present

## 2022-12-12 NOTE — Progress Notes (Signed)
Pt here for follow up. Pt reports she had a recent colonoscopy and felt nauseated a few days after. Pt reports chronic pain to abdominal are, she is being followed by GI.

## 2022-12-12 NOTE — Progress Notes (Signed)
Hematology/Oncology Progress note Telephone:(336) [phone] Fax:(336) [phone]     Chief Complaint: Rachael Cowan is a 37 y.o. female with a history of stage IIIB rectal cancer presents for follow up    ASSESSMENT & PLAN:   History of rectal cancer # Stage IIIB rectal cancer Clinically patient doing well.  No constitutional symptoms. Labs reviewed and discussed with patient. CEA is normal  Images will be obtained as clinically indicated. She is + 10 year post cancer treatments.   Recommend patient continue follow-up with gastroenterology as instructed.    Patient is discharged from my clinic. I recommend patient to continue follow up with primary care physician. Patient may re-establish care in the future if clinically indicated.  All questions were answered. The patient knows to call the clinic with any problems, questions or concerns.  Earlie Server, MD, PhD [facility] Hematology Oncology 12/12/2022   PERTINENT ONCOLOGY HISTORY Patient previously followed up by Dr.Corcoran, patient switched care to me on 11/07/21 Extensive medical record review was performed by me  # History of stage IIIB (T4N1M0) rectal cancer s/p neoadjuvant radiation and 5FU (completed on 03/07/2012) followed by rectosigmoidectomy, hysterectomy and oophorectomy on 05/13/2012 at [facility]  Pathologic stage was ypT2ypN0.  She began post-operative FOLFOX chemotherapy in 05/2012.  She received 11 cycles (completed in 12/2012) secondary to neutropenia. She has a colostomy Patient has surveillance imaging study until 2018.  Colonoscopy on 12/14/2015 revealed one diminutive polyp in the sigmoid colon.  Pathology revealed a tubular adenoma with no evidence of dysplasia or malignancy.  Nodules at the top of the colostomy revealed inflamed and ulcerated colocutaneous tissue.   She has no family history of colon cancer.  My Risk genetic testing was negative per patient report.   INTERVAL HISTORY Rachael Cowan is a 37 y.o.  female who has above history reviewed by me today presents for follow up visit history of stage IIIb rectal cancer [2013] patient continues to do very well clinically.  She has had a colonoscopy [224 ]done recently and had polyps resected pathology showed tubular adenoma. She manages her colostomy well.   Her appetite is good.  Weight is stable.  Past Medical History:  Diagnosis Date   Allergy    Asthma    exercise induced/allergy induced   Bowel obstruction ([place]) 2024   possible?- being evaluated bi GI/sx   Colon polyp    Gallstones    Hypersomnolence disorder 2005   managed with metidate   Obesity    Rectal cancer ([place]) 05/13/2012   Resection with colostomy and hysterectomy(preventative)   Sleep apnea 2005   resolved with ENT surgery,  [name]    Past Surgical History:  Procedure Laterality Date   ABDOMINAL HYSTERECTOMY  2013   ARTHROSCOPIC REPAIR ACL  2006   right knee Dr Donna Christen at [place] 07/21/2016   Procedure: [place] TO OPEN;  Surgeon: Olean Ree, MD;  Location: [facility] ORS;  Service: General;  Laterality: N/A;   COLON SURGERY  2013   COLONOSCOPY WITH PROPOFOL N/A 12/14/2015   Procedure: COLONOSCOPY WITH PROPOFOL;  Surgeon: Manya Silvas, MD;  Location: [facility];  Service: Endoscopy;  Laterality: N/A;   Colostomy Placement  2013   PORTA CATH REMOVAL N/A 11/06/2016   Procedure: [place]i Luis Cath Removal;  Surgeon: Algernon Huxley, MD;  Location: [place] CV LAB;  Service: Cardiovascular;  Laterality: N/A;   TONSILLECTOMY AND ADENOIDECTOMY  [id number]   UMBILICAL HERNIA REPAIR  06/2016  Family History  Problem Relation Age of Onset   Prostate cancer Father    Diverticulitis Father    Alcoholism Father    Skin cancer Maternal Grandmother    Heart attack Maternal Grandfather    Hyperlipidemia Paternal Grandfather    Hypertension Paternal Grandfather    Stomach cancer Neg Hx    Esophageal cancer Neg Hx    Colon  cancer Neg Hx    Colon polyps Neg Hx    Rectal cancer Neg Hx     Social History:  reports that she has never smoked. She has never used smokeless tobacco. She reports that she does not currently use alcohol. She reports that she does not use drugs. She lives in [place], [place], but plans on moving back to [place] where her mother and rest of her family lives.  She is currently in grad school to become a couselor. The patient is alone today.  Allergies:  Allergies  Allergen Reactions   Augmentin [Amoxicillin-Pot Clavulanate] Rash    "Made me feel flushed"    Current Medications: Current Outpatient Medications  Medication Sig Dispense Refill   ADDERALL 20 MG tablet Take 1 tablet (20 mg total) by mouth 2 (two) times daily. 60 tablet 0   estradiol (ESTRACE) 1 MG tablet TAKE 1 TABLET BY MOUTH EVERY DAY 90 tablet 2   fexofenadine (ALLEGRA ODT) 30 MG disintegrating tablet Take 30 mg by mouth daily.     triamcinolone cream (KENALOG) 0.1 % APPLY TO AFFECTED AREA TWICE A DAY (Patient taking differently: Apply 1 Application topically daily as needed.) 30 g 0   ADDERALL 20 MG tablet Take 1 tablet (20 mg total) by mouth 2 (two) times daily. (Patient not taking: Reported on 12/12/2022) 60 tablet 0   dicyclomine (BENTYL) 10 MG capsule TAKE 1 CAPSULE BY MOUTH TWICE A DAY (Patient not taking: Reported on 12/12/2022) 60 capsule 5   Docusate Calcium (STOOL SOFTENER PO) Take 1 capsule by mouth daily as needed (constiaption). (Patient not taking: Reported on 12/12/2022)     mometasone (NASONEX) 50 MCG/ACT nasal spray Place 2 sprays into the nose daily. (Patient not taking: Reported on 12/12/2022)     No current facility-administered medications for this visit.    Review of Systems  Constitutional:  Negative for chills, diaphoresis, fever, malaise/fatigue and weight loss.  HENT:  Negative for congestion, ear discharge, ear pain, hearing loss, nosebleeds, sinus pain, sore throat and tinnitus.   Eyes:   Negative for blurred vision.  Respiratory:  Negative for cough, hemoptysis, sputum production and shortness of breath.   Cardiovascular:  Negative for chest pain, palpitations and leg swelling.  Gastrointestinal:  Negative for abdominal pain, blood in stool, constipation, diarrhea, heartburn, melena, nausea and vomiting.  Genitourinary:  Negative for dysuria, frequency, hematuria and urgency.       Incontinence  Musculoskeletal:  Negative for back pain, joint pain, myalgias and neck pain.  Skin:  Negative for itching and rash.  Neurological:  Negative for dizziness, tingling, sensory change, weakness and headaches.  Endo/Heme/Allergies:  Does not bruise/bleed easily.  Psychiatric/Behavioral:  Negative for depression and memory loss. The patient is not nervous/anxious and does not have insomnia.   All other systems reviewed and are negative.   Performance status (ECOG): 0  Vitals Blood pressure 116/84, pulse 65, temperature (!) 97.5 F (36.4 C), resp. rate 18, weight 205 lb 12.8 oz (93.4 kg), last menstrual period 10/25/2011.   Physical Exam Vitals and nursing note reviewed.  Constitutional:  General: She is not in acute distress.    Appearance: She is not diaphoretic.  HENT:     Head: Normocephalic and atraumatic.  Eyes:     General: No scleral icterus.    Pupils: Pupils are equal, round, and reactive to light.  Cardiovascular:     Rate and Rhythm: Normal rate and regular rhythm.     Heart sounds: Normal heart sounds. No murmur heard. Pulmonary:     Effort: Pulmonary effort is normal. No respiratory distress.     Breath sounds: Normal breath sounds. No wheezing.  Abdominal:     General: Bowel sounds are normal. There is no distension.     Palpations: Abdomen is soft. There is no mass.     Tenderness: There is no abdominal tenderness. There is no guarding or rebound.     Comments: +Colostomy  Musculoskeletal:        General: No swelling or tenderness. Normal range of  motion.     Cervical back: Normal range of motion and neck supple.  Lymphadenopathy:     Head:     Right side of head: No preauricular, posterior auricular or occipital adenopathy.     Left side of head: No preauricular, posterior auricular or occipital adenopathy.     Cervical: No cervical adenopathy.     Upper Body:     Right upper body: No supraclavicular or axillary adenopathy.     Left upper body: No supraclavicular or axillary adenopathy.     Lower Body: No right inguinal adenopathy. No left inguinal adenopathy.  Skin:    General: Skin is warm and dry.  Neurological:     Mental Status: She is alert and oriented to person, place, and time.  Psychiatric:        Mood and Affect: Mood normal.    Labs    Latest Ref Rng & Units 12/09/2022    8:30 AM 08/20/2022    2:46 PM 03/14/2022   11:17 AM  CBC  WBC 4.0 - 10.5 K/uL 4.4  6.8  4.7   Hemoglobin 12.0 - 15.0 g/dL 12.8  14.7  13.3   Hematocrit 36.0 - 46.0 % 36.9  41.3  39.5   Platelets 150 - 400 K/uL 180  257  212.0       Latest Ref Rng & Units 12/09/2022    8:30 AM 09/10/2022    8:08 AM 08/20/2022    2:46 PM  CMP  Glucose 70 - 99 mg/dL 103  91  111   BUN 6 - 20 mg/dL 13  11  12    Creatinine 0.44 - 1.00 mg/dL 0.82  0.88  0.91   Sodium 135 - 145 mmol/L 134  138  138   Potassium 3.5 - 5.1 mmol/L 3.8  3.8  2.9   Chloride 98 - 111 mmol/L 102  103  100   CO2 22 - 32 mmol/L 24  26  21    Calcium 8.9 - 10.3 mg/dL 8.7  8.9  9.2   Total Protein 6.5 - 8.1 g/dL 6.7  6.2  6.6   Total Bilirubin 0.3 - 1.2 mg/dL 0.3  0.7  0.5   Alkaline Phos 38 - 126 U/L 66  72  89   AST 15 - 41 U/L 19  18  19    ALT 0 - 44 U/L 24  28  45

## 2022-12-12 NOTE — Assessment & Plan Note (Addendum)
#   Stage IIIB rectal cancer Clinically patient doing well.  No constitutional symptoms. Labs reviewed and discussed with patient. CEA is normal  Images will be obtained as clinically indicated. She is + 10 year post cancer treatments.   Recommend patient continue follow-up with gastroenterology as instructed.

## 2022-12-22 DIAGNOSIS — Z6837 Body mass index (BMI) 37.0-37.9, adult: Secondary | ICD-10-CM | POA: Diagnosis not present

## 2022-12-22 DIAGNOSIS — J0140 Acute pansinusitis, unspecified: Secondary | ICD-10-CM | POA: Diagnosis not present

## 2023-01-17 DIAGNOSIS — Z1152 Encounter for screening for COVID-19: Secondary | ICD-10-CM | POA: Diagnosis not present

## 2023-01-17 DIAGNOSIS — J00 Acute nasopharyngitis [common cold]: Secondary | ICD-10-CM | POA: Diagnosis not present

## 2023-01-17 DIAGNOSIS — Z03818 Encounter for observation for suspected exposure to other biological agents ruled out: Secondary | ICD-10-CM | POA: Diagnosis not present

## 2023-01-17 DIAGNOSIS — Z6837 Body mass index (BMI) 37.0-37.9, adult: Secondary | ICD-10-CM | POA: Diagnosis not present

## 2023-02-09 DIAGNOSIS — Z933 Colostomy status: Secondary | ICD-10-CM | POA: Diagnosis not present

## 2023-02-09 DIAGNOSIS — C20 Malignant neoplasm of rectum: Secondary | ICD-10-CM | POA: Diagnosis not present

## 2023-02-10 DIAGNOSIS — Z933 Colostomy status: Secondary | ICD-10-CM | POA: Diagnosis not present

## 2023-02-10 DIAGNOSIS — C20 Malignant neoplasm of rectum: Secondary | ICD-10-CM | POA: Diagnosis not present

## 2023-02-11 DIAGNOSIS — Z933 Colostomy status: Secondary | ICD-10-CM | POA: Diagnosis not present

## 2023-02-18 ENCOUNTER — Other Ambulatory Visit: Payer: Self-pay | Admitting: Internal Medicine

## 2023-02-18 ENCOUNTER — Telehealth: Payer: Self-pay | Admitting: Internal Medicine

## 2023-02-18 MED ORDER — ADDERALL 20 MG PO TABS: 20.0000 mg | ORAL_TABLET | Freq: Two times a day (BID) | ORAL | 0 refills | Status: DC

## 2023-02-18 NOTE — Telephone Encounter (Signed)
LOV: 08/20/22  NOV: N/A

## 2023-03-23 DIAGNOSIS — Z933 Colostomy status: Secondary | ICD-10-CM | POA: Diagnosis not present

## 2023-03-23 DIAGNOSIS — C20 Malignant neoplasm of rectum: Secondary | ICD-10-CM | POA: Diagnosis not present

## 2023-03-24 DIAGNOSIS — Z933 Colostomy status: Secondary | ICD-10-CM | POA: Diagnosis not present

## 2023-03-24 DIAGNOSIS — C20 Malignant neoplasm of rectum: Secondary | ICD-10-CM | POA: Diagnosis not present

## 2023-03-25 ENCOUNTER — Ambulatory Visit: Payer: BC Managed Care – PPO | Admitting: Internal Medicine

## 2023-03-25 ENCOUNTER — Encounter: Payer: Self-pay | Admitting: Internal Medicine

## 2023-03-25 VITALS — BP 126/68 | HR 90 | Temp 98.3°F | Ht 61.5 in | Wt 209.8 lb

## 2023-03-25 DIAGNOSIS — G4710 Hypersomnia, unspecified: Secondary | ICD-10-CM

## 2023-03-25 DIAGNOSIS — Z5181 Encounter for therapeutic drug level monitoring: Secondary | ICD-10-CM

## 2023-03-25 DIAGNOSIS — R7301 Impaired fasting glucose: Secondary | ICD-10-CM

## 2023-03-25 DIAGNOSIS — E781 Pure hyperglyceridemia: Secondary | ICD-10-CM | POA: Diagnosis not present

## 2023-03-25 DIAGNOSIS — E876 Hypokalemia: Secondary | ICD-10-CM

## 2023-03-25 DIAGNOSIS — K760 Fatty (change of) liver, not elsewhere classified: Secondary | ICD-10-CM

## 2023-03-25 DIAGNOSIS — Z933 Colostomy status: Secondary | ICD-10-CM

## 2023-03-25 DIAGNOSIS — Z85048 Personal history of other malignant neoplasm of rectum, rectosigmoid junction, and anus: Secondary | ICD-10-CM

## 2023-03-25 DIAGNOSIS — K435 Parastomal hernia without obstruction or  gangrene: Secondary | ICD-10-CM

## 2023-03-25 DIAGNOSIS — Z79890 Hormone replacement therapy (postmenopausal): Secondary | ICD-10-CM

## 2023-03-25 DIAGNOSIS — Z1231 Encounter for screening mammogram for malignant neoplasm of breast: Secondary | ICD-10-CM

## 2023-03-25 MED ORDER — DOXYCYCLINE HYCLATE 100 MG PO TABS: 100.0000 mg | ORAL_TABLET | Freq: Two times a day (BID) | ORAL | 0 refills | Status: DC

## 2023-03-25 MED ORDER — ESTRADIOL 2 MG PO TABS: 2.0000 mg | ORAL_TABLET | Freq: Every day | ORAL | 1 refills | Status: DC

## 2023-03-25 MED ORDER — AMPHETAMINE-DEXTROAMPHETAMINE 20 MG PO TABS: 20.0000 mg | ORAL_TABLET | Freq: Two times a day (BID) | ORAL | 0 refills | Status: DC

## 2023-03-25 NOTE — Assessment & Plan Note (Addendum)
She was referred to Gen Surgery in 2023 for ostomy stenosis and was seen by Romie Levee, MD [facility] surgery corrective surgery was offered as a potential remedy to her episodes of abdominal pain

## 2023-03-25 NOTE — Patient Instructions (Addendum)
I have Increased your estrogen  supplement to 2 mg daily as a trial .  Let me know if it is helpful and I will Refill when needed   Your annual mammogram has been ordered   [facility] will not allow [place] to schedule it for you,  so please  call to make your appointment [phone]

## 2023-03-25 NOTE — Progress Notes (Unsigned)
Subjective:  Patient ID: Rachael Cowan, female    DOB: 1986-09-28  Age: 37 y.o. MRN: [id number]  CC: The primary encounter diagnosis was Encounter for screening mammogram for malignant neoplasm of breast. Diagnoses of S/P colostomy (HCC), Hepatic steatosis, Hypertriglyceridemia, Impaired fasting glucose, History of rectal cancer, Encounter for monitoring estrogen replacement therapy following surgical menopause, Hypersomnia, Hypokalemia due to excessive gastrointestinal loss of potassium, and Parastomal hernia without obstruction or gangrene were also pertinent to this visit.   HPI Rachael Cowan presents for  Chief Complaint  Patient presents with   Medical Management of Chronic Issues   Rachael Cowan is a  37 yr old female with a h/o  stage IIIB (T4N1M0) rectal cancer s/p neoadjuvant radiation and 5FU (completed on 03/07/2012) followed by rectosigmoidectomy, hysterectomy and oophorectomy on 05/13/2012 at [facility] cancer ,  hypersomia , fatty lliver and menopause syndrome who presents for 6 month follow up  She feels generally well  .  She is actively trying to lose weight but struggling to find time to exercise due to work demands.   Hypersomnia;  her wakefulness is  fine on current dose of adderall 20 mg bid and denies insomnia and other side effects.  Since her last visit she had a Gen Surg evaluation for abdominal pain related to stenosis of stoma.  Surgery was contemplated but deferred and symptoms have improved   Menopause:  symptoms have become more troublesome ,  flushing ,  irritability .     Outpatient Medications Prior to Visit  Medication Sig Dispense Refill   ADDERALL 20 MG tablet Take 1 tablet (20 mg total) by mouth 2 (two) times daily. 60 tablet 0   dicyclomine (BENTYL) 10 MG capsule TAKE 1 CAPSULE BY MOUTH TWICE A DAY 60 capsule 5   Docusate Calcium (STOOL SOFTENER PO) Take 1 capsule by mouth daily as needed (constiaption).     fexofenadine (ALLEGRA ODT) 30 MG disintegrating  tablet Take 30 mg by mouth daily.     mometasone (NASONEX) 50 MCG/ACT nasal spray Place 2 sprays into the nose daily.     triamcinolone cream (KENALOG) 0.1 % APPLY TO AFFECTED AREA TWICE A DAY (Patient taking differently: Apply 1 Application topically daily as needed.) 30 g 0   estradiol (ESTRACE) 1 MG tablet TAKE 1 TABLET BY MOUTH EVERY DAY 90 tablet 2   No facility-administered medications prior to visit.    Review of Systems;  Patient denies headache, fevers, malaise, unintentional weight loss, skin rash, eye pain, sinus congestion and sinus pain, sore throat, dysphagia,  hemoptysis , cough, dyspnea, wheezing, chest pain, palpitations, orthopnea, edema, abdominal pain, nausea, melena, diarrhea, constipation, flank pain, dysuria, hematuria, urinary  Frequency, nocturia, numbness, tingling, seizures,  Focal weakness, Loss of consciousness,  Tremor, insomnia, depression, anxiety, and suicidal ideation.      Objective:  BP 126/68   Pulse 90   Temp 98.3 F (36.8 C) (Oral)   Ht 5' 1.5" (1.562 m)   Wt 209 lb 12.8 oz (95.2 kg)   LMP 10/25/2011   SpO2 98%   BMI 39.00 kg/m   BP Readings from Last 3 Encounters:  03/25/23 126/68  12/12/22 116/84  10/31/22 113/78    Wt Readings from Last 3 Encounters:  03/25/23 209 lb 12.8 oz (95.2 kg)  12/12/22 205 lb 12.8 oz (93.4 kg)  10/31/22 200 lb (90.7 kg)    Physical Exam  Lab Results  Component Value Date   HGBA1C 5.7 (H) 08/20/2022   HGBA1C 5.5  02/01/2020   HGBA1C 5.6 07/17/2016    Lab Results  Component Value Date   CREATININE 0.82 12/09/2022   CREATININE 0.88 09/10/2022   CREATININE 0.91 08/20/2022    Lab Results  Component Value Date   WBC 4.4 12/09/2022   HGB 12.8 12/09/2022   HCT 36.9 12/09/2022   PLT 180 12/09/2022   GLUCOSE 103 (H) 12/09/2022   CHOL 139 08/20/2022   TRIG 224 (H) 08/20/2022   HDL 44 08/20/2022   LDLDIRECT 56 08/20/2022   LDLCALC 59 08/20/2022   ALT 24 12/09/2022   AST 19 12/09/2022   NA 134  (L) 12/09/2022   K 3.8 12/09/2022   CL 102 12/09/2022   CREATININE 0.82 12/09/2022   BUN 13 12/09/2022   CO2 24 12/09/2022   TSH 1.550 08/20/2022   HGBA1C 5.7 (H) 08/20/2022    No results found.  Assessment & Plan:  .Encounter for screening mammogram for malignant neoplasm of breast -     3D Screening Mammogram, Left and Right; Future  S/P colostomy [facility]) Assessment & Plan: She was referred to Gen Surgery in 2023 for ostomy stenosis and was seen by Romie Levee, MD [facility] surgery corrective surgery was offered as a potential remedy to her episodes of abdominal pain    Hepatic steatosis Assessment & Plan: Presumed by ultrasound changes and negative serologies to rule out autoimmune causes of hepatitis.  Current liver enzymes are  normalized  all modifiable risk factors including obesity, screening for diabetes and hyperlipidemia have been addressed   Lab Results  Component Value Date   ALT 24 12/09/2022   AST 19 12/09/2022   ALKPHOS 66 12/09/2022   BILITOT 0.3 12/09/2022     Orders: -     Comprehensive metabolic panel; Future  Hypertriglyceridemia -     Lipid panel; Future -     TSH; Future  Impaired fasting glucose -     Hemoglobin A1c; Future  History of rectal cancer -     CBC with Differential/Platelet; Future  Encounter for monitoring estrogen replacement therapy following surgical menopause Assessment & Plan: Symptoms are not controlled with 1 mg estradiol dose.  Increasing dose to 2 mg daily    Hypersomnia Assessment & Plan: Managed with  short acting methylphenidate .  Refills given following review of Refill history confirmed via [place] Controlled Substance databas, accessed by me today..    Hypokalemia due to excessive gastrointestinal loss of potassium Assessment & Plan: Resolved following resolution of diarrhea  Lab Results  Component Value Date   NA 134 (L) 12/09/2022   K 3.8 12/09/2022   CL 102 12/09/2022   CO2 24 12/09/2022    Likely low in magnesium as well given history of diarrhea  1 week.    Parastomal hernia without obstruction or gangrene Assessment & Plan: Noted on recent CT Abd/pelvis during workup for abdominal pain now resolved.  H/o partial colectomy and colostomy done remotely at [facility], patient has seen local Gen Surg, surgery deferred    Other orders -     Estradiol; Take 1 tablet (2 mg total) by mouth daily.  Dispense: 30 tablet; Refill: 1 -     Doxycycline Hyclate; Take 1 tablet (100 mg total) by mouth 2 (two) times daily.  Dispense: 14 tablet; Refill: 0 -     Amphetamine-Dextroamphetamine; Take 1 tablet (20 mg total) by mouth 2 (two) times daily.  Dispense: 60 tablet; Refill: 0 -     Amphetamine-Dextroamphetamine; Take 1 tablet (20  mg total) by mouth 2 (two) times daily.  Dispense: 60 tablet; Refill: 0 -     Amphetamine-Dextroamphetamine; Take 1 tablet (20 mg total) by mouth 2 (two) times daily.  Dispense: 60 tablet; Refill: 0     I provided 30 minutes of face-to-face time during this encounter reviewing patient's last visit with me, patient's  most recent visit with general surgery ,   recent surgical and non surgical procedures, previous  labs and imaging studies, counseling on currently addressed issues,  and post visit ordering to diagnostics and therapeutics .   Follow-up: Return in about 6 months (around 09/24/2023).   Sherlene Shams, MD

## 2023-03-26 NOTE — Assessment & Plan Note (Signed)
Symptoms are not controlled with 1 mg estradiol dose.  Increasing dose to 2 mg daily

## 2023-03-26 NOTE — Assessment & Plan Note (Signed)
Presumed by ultrasound changes and negative serologies to rule out autoimmune causes of hepatitis.  Current liver enzymes are  normalized  all modifiable risk factors including obesity, screening for diabetes and hyperlipidemia have been addressed   Lab Results  Component Value Date   ALT 24 12/09/2022   AST 19 12/09/2022   ALKPHOS 66 12/09/2022   BILITOT 0.3 12/09/2022

## 2023-03-26 NOTE — Assessment & Plan Note (Signed)
Managed with  short acting methylphenidate .  Refills given following review of Refill history confirmed via [place] Controlled Substance databas, accessed by me today..  

## 2023-03-26 NOTE — Assessment & Plan Note (Signed)
Resolved following resolution of diarrhea  Lab Results  Component Value Date   NA 134 (L) 12/09/2022   K 3.8 12/09/2022   CL 102 12/09/2022   CO2 24 12/09/2022   Likely low in magnesium as well given history of diarrhea  1 week.

## 2023-03-26 NOTE — Assessment & Plan Note (Signed)
Noted on recent CT Abd/pelvis during workup for abdominal pain now resolved.  H/o partial colectomy and colostomy done remotely at [facility], patient has seen local Gen Surg, surgery deferred

## 2023-04-20 ENCOUNTER — Other Ambulatory Visit: Payer: Self-pay | Admitting: Internal Medicine

## 2023-04-21 NOTE — Telephone Encounter (Signed)
My Chart message sent

## 2023-04-24 ENCOUNTER — Ambulatory Visit
Admission: RE | Admit: 2023-04-24 | Discharge: 2023-04-24 | Disposition: A | Discharge status: Home / Self Care | Payer: BC Managed Care – PPO | Source: Ambulatory Visit | Attending: Internal Medicine | Admitting: Internal Medicine

## 2023-04-24 DIAGNOSIS — Z1231 Encounter for screening mammogram for malignant neoplasm of breast: Secondary | ICD-10-CM | POA: Insufficient documentation

## 2023-04-27 ENCOUNTER — Other Ambulatory Visit: Payer: Self-pay | Admitting: Internal Medicine

## 2023-04-27 DIAGNOSIS — N6489 Other specified disorders of breast: Secondary | ICD-10-CM

## 2023-04-27 DIAGNOSIS — R928 Other abnormal and inconclusive findings on diagnostic imaging of breast: Secondary | ICD-10-CM

## 2023-04-28 ENCOUNTER — Telehealth: Payer: Self-pay

## 2023-04-28 NOTE — Telephone Encounter (Signed)
LMTCB in regards to lab results.  

## 2023-05-01 ENCOUNTER — Other Ambulatory Visit: Payer: Self-pay | Admitting: Internal Medicine

## 2023-05-01 ENCOUNTER — Ambulatory Visit
Admission: RE | Admit: 2023-05-01 | Discharge: 2023-05-01 | Disposition: A | Discharge status: Home / Self Care | Payer: BC Managed Care – PPO | Source: Ambulatory Visit | Attending: Internal Medicine | Admitting: Internal Medicine

## 2023-05-01 DIAGNOSIS — N63 Unspecified lump in unspecified breast: Secondary | ICD-10-CM

## 2023-05-01 DIAGNOSIS — N6315 Unspecified lump in the right breast, overlapping quadrants: Secondary | ICD-10-CM | POA: Diagnosis not present

## 2023-05-01 DIAGNOSIS — N6311 Unspecified lump in the right breast, upper outer quadrant: Secondary | ICD-10-CM | POA: Diagnosis not present

## 2023-05-01 DIAGNOSIS — Z1239 Encounter for other screening for malignant neoplasm of breast: Secondary | ICD-10-CM | POA: Diagnosis not present

## 2023-05-01 DIAGNOSIS — N6489 Other specified disorders of breast: Secondary | ICD-10-CM

## 2023-05-01 DIAGNOSIS — R928 Other abnormal and inconclusive findings on diagnostic imaging of breast: Secondary | ICD-10-CM

## 2023-05-08 ENCOUNTER — Ambulatory Visit
Admission: RE | Admit: 2023-05-08 | Discharge: 2023-05-08 | Disposition: A | Discharge status: Home / Self Care | Payer: BC Managed Care – PPO | Source: Ambulatory Visit | Attending: Internal Medicine | Admitting: Internal Medicine

## 2023-05-08 ENCOUNTER — Ambulatory Visit: Admission: RE | Admit: 2023-05-08 | Payer: BC Managed Care – PPO | Source: Ambulatory Visit

## 2023-05-08 DIAGNOSIS — R928 Other abnormal and inconclusive findings on diagnostic imaging of breast: Secondary | ICD-10-CM | POA: Insufficient documentation

## 2023-05-08 DIAGNOSIS — K435 Parastomal hernia without obstruction or  gangrene: Secondary | ICD-10-CM

## 2023-05-08 DIAGNOSIS — N63 Unspecified lump in unspecified breast: Secondary | ICD-10-CM | POA: Insufficient documentation

## 2023-05-08 DIAGNOSIS — N6311 Unspecified lump in the right breast, upper outer quadrant: Secondary | ICD-10-CM | POA: Diagnosis not present

## 2023-05-08 DIAGNOSIS — N6315 Unspecified lump in the right breast, overlapping quadrants: Secondary | ICD-10-CM | POA: Diagnosis not present

## 2023-05-08 HISTORY — PX: BREAST BIOPSY: SHX20

## 2023-05-08 MED ORDER — LIDOCAINE 1 % OPTIME INJ - NO CHARGE
1.0000 mL | Freq: Once | INTRAMUSCULAR | Status: AC
  Administered 2023-05-08: 1 mL via INTRADERMAL
  Filled 2023-05-08: qty 2

## 2023-05-08 MED ORDER — LIDOCAINE-EPINEPHRINE 1 %-1:100000 IJ SOLN
8.0000 mL | Freq: Once | INTRAMUSCULAR | Status: AC
  Administered 2023-05-08: 8 mL
  Filled 2023-05-08: qty 8

## 2023-05-18 DIAGNOSIS — C20 Malignant neoplasm of rectum: Secondary | ICD-10-CM | POA: Diagnosis not present

## 2023-05-18 DIAGNOSIS — Z933 Colostomy status: Secondary | ICD-10-CM | POA: Diagnosis not present

## 2023-07-09 DIAGNOSIS — Z933 Colostomy status: Secondary | ICD-10-CM | POA: Diagnosis not present

## 2023-07-09 DIAGNOSIS — C20 Malignant neoplasm of rectum: Secondary | ICD-10-CM | POA: Diagnosis not present

## 2023-07-13 ENCOUNTER — Other Ambulatory Visit: Payer: Self-pay | Admitting: Internal Medicine

## 2023-07-13 MED ORDER — AMPHETAMINE-DEXTROAMPHETAMINE 20 MG PO TABS: 20.0000 mg | ORAL_TABLET | Freq: Two times a day (BID) | ORAL | 0 refills | Status: DC

## 2023-07-14 DIAGNOSIS — Z933 Colostomy status: Secondary | ICD-10-CM | POA: Diagnosis not present

## 2023-08-24 ENCOUNTER — Other Ambulatory Visit: Payer: Self-pay | Admitting: Internal Medicine

## 2023-08-24 MED ORDER — AMPHETAMINE-DEXTROAMPHETAMINE 20 MG PO TABS: 20.0000 mg | ORAL_TABLET | Freq: Two times a day (BID) | ORAL | 0 refills | Status: DC

## 2023-09-10 ENCOUNTER — Other Ambulatory Visit: Payer: BC Managed Care – PPO

## 2023-09-10 DIAGNOSIS — K760 Fatty (change of) liver, not elsewhere classified: Secondary | ICD-10-CM | POA: Diagnosis not present

## 2023-09-10 DIAGNOSIS — Z85048 Personal history of other malignant neoplasm of rectum, rectosigmoid junction, and anus: Secondary | ICD-10-CM

## 2023-09-10 DIAGNOSIS — R7301 Impaired fasting glucose: Secondary | ICD-10-CM | POA: Diagnosis not present

## 2023-09-10 DIAGNOSIS — E781 Pure hyperglyceridemia: Secondary | ICD-10-CM | POA: Diagnosis not present

## 2023-09-10 LAB — CBC WITH DIFFERENTIAL/PLATELET
Basophils Absolute: 0.1 10*3/uL (ref 0.0–0.1)
Basophils Relative: 1.3 % (ref 0.0–3.0)
Eosinophils Absolute: 0.1 10*3/uL (ref 0.0–0.7)
Eosinophils Relative: 2.1 % (ref 0.0–5.0)
HCT: 40.4 % (ref 36.0–46.0)
Hemoglobin: 13.4 g/dL (ref 12.0–15.0)
Lymphocytes Relative: 39.9 % (ref 12.0–46.0)
Lymphs Abs: 2.1 10*3/uL (ref 0.7–4.0)
MCHC: 33.2 g/dL (ref 30.0–36.0)
MCV: 90.6 fL (ref 78.0–100.0)
Monocytes Absolute: 0.4 10*3/uL (ref 0.1–1.0)
Monocytes Relative: 7.9 % (ref 3.0–12.0)
Neutro Abs: 2.5 10*3/uL (ref 1.4–7.7)
Neutrophils Relative %: 48.8 % (ref 43.0–77.0)
Platelets: 206 10*3/uL (ref 150.0–400.0)
RBC: 4.46 Mil/uL (ref 3.87–5.11)
RDW: 13.9 % (ref 11.5–15.5)
WBC: 5.2 10*3/uL (ref 4.0–10.5)

## 2023-09-10 LAB — COMPREHENSIVE METABOLIC PANEL
ALT: 29 U/L (ref 0–35)
AST: 24 U/L (ref 0–37)
Albumin: 3.8 g/dL (ref 3.5–5.2)
Alkaline Phosphatase: 72 U/L (ref 39–117)
BUN: 13 mg/dL (ref 6–23)
CO2: 23 meq/L (ref 19–32)
Calcium: 8.7 mg/dL (ref 8.4–10.5)
Chloride: 103 meq/L (ref 96–112)
Creatinine, Ser: 0.89 mg/dL (ref 0.40–1.20)
GFR: 83.76 mL/min (ref >60.00)
Glucose, Bld: 102 mg/dL — ABNORMAL HIGH (ref 70–99)
Potassium: 4.2 meq/L (ref 3.5–5.1)
Sodium: 136 meq/L (ref 135–145)
Total Bilirubin: 0.4 mg/dL (ref 0.2–1.2)
Total Protein: 6.3 g/dL (ref 6.0–8.3)

## 2023-09-10 LAB — LIPID PANEL
Cholesterol: 193 mg/dL (ref 0–200)
HDL: 58.4 mg/dL (ref >39.00)
LDL Cholesterol: 65 mg/dL (ref 0–99)
NonHDL: 134.14
Total CHOL/HDL Ratio: 3
Triglycerides: 345 mg/dL — ABNORMAL HIGH (ref 0.0–149.0)
VLDL: 69 mg/dL — ABNORMAL HIGH (ref 0.0–40.0)

## 2023-09-10 LAB — TSH: TSH: 3.49 u[IU]/mL (ref 0.35–5.50)

## 2023-09-10 LAB — HEMOGLOBIN A1C: Hgb A1c MFr Bld: 5.7 % (ref 4.6–6.5)

## 2023-09-15 ENCOUNTER — Encounter: Payer: Self-pay | Admitting: Internal Medicine

## 2023-09-15 ENCOUNTER — Ambulatory Visit: Payer: BC Managed Care – PPO | Admitting: Internal Medicine

## 2023-09-15 ENCOUNTER — Telehealth: Payer: Self-pay

## 2023-09-15 VITALS — BP 130/84 | HR 96 | Ht 61.5 in | Wt 212.8 lb

## 2023-09-15 DIAGNOSIS — Z9889 Other specified postprocedural states: Secondary | ICD-10-CM

## 2023-09-15 DIAGNOSIS — Z6839 Body mass index (BMI) 39.0-39.9, adult: Secondary | ICD-10-CM

## 2023-09-15 DIAGNOSIS — R232 Flushing: Secondary | ICD-10-CM | POA: Diagnosis not present

## 2023-09-15 DIAGNOSIS — E66812 Obesity, class 2: Secondary | ICD-10-CM | POA: Diagnosis not present

## 2023-09-15 DIAGNOSIS — J3089 Other allergic rhinitis: Secondary | ICD-10-CM | POA: Diagnosis not present

## 2023-09-15 DIAGNOSIS — Z5181 Encounter for therapeutic drug level monitoring: Secondary | ICD-10-CM | POA: Diagnosis not present

## 2023-09-15 DIAGNOSIS — Z0001 Encounter for general adult medical examination with abnormal findings: Secondary | ICD-10-CM | POA: Diagnosis not present

## 2023-09-15 DIAGNOSIS — E781 Pure hyperglyceridemia: Secondary | ICD-10-CM

## 2023-09-15 MED ORDER — ESTRADIOL 1 MG PO TABS: 1.0000 mg | ORAL_TABLET | Freq: Every day | ORAL | 1 refills | Status: DC

## 2023-09-15 MED ORDER — DICYCLOMINE HCL 10 MG PO CAPS: 10.0000 mg | ORAL_CAPSULE | Freq: Two times a day (BID) | ORAL | 5 refills | Status: DC

## 2023-09-15 NOTE — Telephone Encounter (Signed)
Medication Samples have been provided to the patient.  Drug name: ZYSAYT       Strength: 0.25 mg         Qty: 1 box  LOT: [id number]  Exp.Date: 02/26/2024  Dosing instructions: Inject 0.25 mg into skin once weekly.   The patient has been instructed regarding the correct time, dose, and frequency of taking this medication, including desired effects and most common side effects.   Dion Parrow 4:41 PM 09/15/2023

## 2023-09-15 NOTE — Patient Instructions (Signed)
I am recommending an injectable medication to help curb your appetite,  It is called Reginal Lutes  It slows down your stomach emptying so you stay full longer .  Here are some local and online Sources for zepbound and [phone] (direct pay)   [employer] CASH PAY FOR ZEPBOUND VIAL [place], [place] - [address]  Isaac Bliss  Drug store in [place] makes a compounded version of semiglutide [employer] (online  GLP)  [facility] Cooter Med[facility]:  (Online GLP )  Delrae Rend MD in [place] also provides medication through a weight management program    If the bowels slow down try adding magnesium citrate at bedtime    Referral to allergist in process   Consider the home 2 night sleep study we can arrange via mychart

## 2023-09-15 NOTE — Assessment & Plan Note (Signed)
Right breast August 2024

## 2023-09-15 NOTE — Assessment & Plan Note (Addendum)
Body mass index is 39.56 kg/m. She has trouble suppressing appetite and eating junk food and is requesting pharmacotherapy . She is taking adderall for ADD and management of daytime sleepiness which may indicate recurrence of OSA which she had as a child.  .  Patient has no contraindications to using this medication.  The risks  and benefits were reviewed, and instructions on titration of dose were outlined.  .sample of [place] given to patient doay

## 2023-09-15 NOTE — Assessment & Plan Note (Signed)
Symptoms  were  not controlled with 1 mg estradiol dose. In [place] and dose was increased to  2 mg daily

## 2023-09-15 NOTE — Progress Notes (Unsigned)
Patient ID: RANYA RICHBERG, female    DOB: 1986-05-04  Age: 37 y.o. MRN: [id number]  The patient is here for annual preventive examination and management of other chronic and acute problems.   The risk factors are reflected in the social history.   The roster of all physicians providing medical care to patient - is listed in the Snapshot section of the chart.   Activities of daily living:  The patient is 100% independent in all ADLs: dressing, toileting, feeding as well as independent mobility   Home safety : The patient has smoke detectors in the home. They wear seatbelts.  There are no unsecured firearms at home. There is no violence in the home.    There is no risks for hepatitis, STDs or HIV. There is no   history of blood transfusion. They have no travel history to infectious disease endemic areas of the world.   The patient has seen their dentist in the last six month. They have seen their eye doctor in the last year. The patinet  denies slight hearing difficulty with regard to whispered voices and some television programs.  They have deferred audiologic testing in the last year.  They do not  have excessive sun exposure. Discussed the need for sun protection: hats, long sleeves and use of sunscreen if there is significant sun exposure.    Diet: the importance of a healthy diet is discussed. They do have a healthy diet.   The benefits of regular aerobic exercise were discussed. The patient  exercises  3 to 5 days per week  for  60 minutes.    Depression screen: there are no signs or vegative symptoms of depression- irritability, change in appetite, anhedonia, sadness/tearfullness.   The following portions of the patient's history were reviewed and updated as appropriate: allergies, current medications, past family history, past medical history,  past surgical history, past social history  and problem list.   Visual acuity was not assessed per patient preference since the patient has  regular follow up with an  ophthalmologist. Hearing and body mass index were assessed and reviewed.    During the course of the visit the patient was educated and counseled about appropriate screening and preventive services including : fall prevention , diabetes screening, nutrition counseling, colorectal cancer screening, and recommended immunizations.    Chief Complaint:   "Very busy time of year"    in counselling all ages.  Stressed after parent/child sessions.  Using energy drinks   BP normal otherwise    Intermittent facial flushing with alcohol  intake but not sure which alcohol .  Definitely occurs with use of Bentyl but other times not related to bentyl   Daytime somnolence  treated with adderall  had OSA  as a child ,  has nog been tested in years.    Not losing weight Patient has been unable to lose or maintain a healthy weight despite good effort. Encouraged to increase exercise involvement to include more intense aerobic activity for 30 minutes 5 days per week.  Screened for contraindications to use of  GLP 1 agonists for appetite suppression and she has none.  The risks and benefits of pharmacotherapy discussed and she is requesting a trial of therapy .  rx written.      Review of Symptoms  Patient denies headache, fevers, malaise, unintentional weight loss, skin rash, eye pain, sinus congestion and sinus pain, sore throat, dysphagia,  hemoptysis , cough, dyspnea, wheezing, chest pain, palpitations, orthopnea, edema,  abdominal pain, nausea, melena, diarrhea, constipation, flank pain, dysuria, hematuria, urinary  Frequency, nocturia, numbness, tingling, seizures,  Focal weakness, Loss of consciousness,  Tremor, insomnia, depression, anxiety, and suicidal ideation.    Physical Exam:  BP 130/84   Pulse 96   Ht 5' 1.5" (1.562 m)   Wt 212 lb 12.8 oz (96.5 kg)   LMP 10/25/2011   SpO2 96%   BMI 39.56 kg/m    Physical Exam Vitals reviewed.  Constitutional:      General: She  is not in acute distress.    Appearance: Normal appearance. She is normal weight. She is not ill-appearing, toxic-appearing or diaphoretic.  HENT:     Head: Normocephalic.  Eyes:     General: No scleral icterus.       Right eye: No discharge.        Left eye: No discharge.     Conjunctiva/sclera: Conjunctivae normal.  Cardiovascular:     Rate and Rhythm: Normal rate and regular rhythm.     Heart sounds: Normal heart sounds.  Pulmonary:     Effort: Pulmonary effort is normal. No respiratory distress.     Breath sounds: Normal breath sounds.  Musculoskeletal:        General: Normal range of motion.  Skin:    General: Skin is warm and dry.  Neurological:     General: No focal deficit present.     Mental Status: She is alert and oriented to person, place, and time. Mental status is at baseline.  Psychiatric:        Mood and Affect: Mood normal.        Behavior: Behavior normal.        Thought Content: Thought content normal.        Judgment: Judgment normal.    Assessment and Plan: Encounter for monitoring estrogen replacement therapy following surgical menopause Assessment & Plan: Symptoms  were  not controlled with 1 mg estradiol dose. In [place] and dose was increased to  2 mg daily    Class 2 severe obesity due to excess calories with serious comorbidity and body mass index (BMI) of 39.0 to 39.9 in adult [facility]) Assessment & Plan: Body mass index is 39.56 kg/m. She has trouble suppressing appetite and eating junk food and is requesting pharmacotherapy . She is taking adderall for ADD and management of daytime sleepiness which may indicate recurrence of OSA which she had as a child.  .  Patient has no contraindications to using this medication.  The risks  and benefits were reviewed, and instructions on titration of dose were outlined.  .sample of Wegoby given to patient doay     History of benign breast biopsy Assessment & Plan: Right breast August 2024    Non-seasonal  allergic rhinitis, unspecified trigger -     Ambulatory referral to Allergy  Hypertriglyceridemia Assessment & Plan: She will return for repeat labs after a trial of wegovy for 4 weeks   Lab Results  Component Value Date   CHOL 193 09/10/2023   HDL 58.40 09/10/2023   LDLCALC 65 09/10/2023   LDLDIRECT 56 08/20/2022   TRIG 345.0 (H) 09/10/2023   CHOLHDL 3 09/10/2023     Orders: -     Lipid panel; Future -     LDL cholesterol, direct; Future  Flushing reaction Assessment & Plan: New onset , Occurring with intake of alcohol.  Screening for carcinoid, mastocytosis pheochromocytoma advised.  Orders: -     5 HIAA, quantitative, urine, 24  hour; Future -     Catecholamines, fractionated, urine, 24 hour; Future -     Tryptase; Future  Other orders -     Dicyclomine HCl; Take 1 capsule (10 mg total) by mouth 2 (two) times daily.  Dispense: 60 capsule; Refill: 5 -     Estradiol; Take 1 tablet (1 mg total) by mouth daily.  Dispense: 90 tablet; Refill: 1    Return in about 6 months (around 03/15/2024).  Sherlene Shams, MD

## 2023-09-15 NOTE — Assessment & Plan Note (Signed)
She will return for repeat labs after a trial of wegovy for 4 weeks   Lab Results  Component Value Date   CHOL 193 09/10/2023   HDL 58.40 09/10/2023   LDLCALC 65 09/10/2023   LDLDIRECT 56 08/20/2022   TRIG 345.0 (H) 09/10/2023   CHOLHDL 3 09/10/2023

## 2023-09-16 ENCOUNTER — Encounter: Payer: Self-pay | Admitting: Internal Medicine

## 2023-09-16 DIAGNOSIS — R232 Flushing: Secondary | ICD-10-CM | POA: Insufficient documentation

## 2023-09-16 NOTE — Assessment & Plan Note (Signed)
New onset , Occurring with intake of alcohol.  Screening for carcinoid, mastocytosis pheochromocytoma advised.

## 2023-09-18 DIAGNOSIS — C20 Malignant neoplasm of rectum: Secondary | ICD-10-CM | POA: Diagnosis not present

## 2023-09-18 DIAGNOSIS — Z933 Colostomy status: Secondary | ICD-10-CM | POA: Diagnosis not present

## 2023-09-21 ENCOUNTER — Other Ambulatory Visit: Payer: BC Managed Care – PPO

## 2023-09-21 DIAGNOSIS — R232 Flushing: Secondary | ICD-10-CM

## 2023-10-02 LAB — CATECHOLAMINES, FRACTIONATED, URINE, 24 HOUR
Calc Total (E+NE): 48 ug/(24.h) (ref 26–121)
Creatinine, Urine mg/day-CATEUR: 1.69 g/(24.h) (ref 0.50–2.15)
Dopamine 24 Hr Urine: 191 ug/(24.h) (ref 52–480)
Epinephrine, 24H, Ur: 5 ug/(24.h) (ref 2–24)
Norepinephrine, 24H, Ur: 43 ug/(24.h) (ref 15–100)
Total Volume: 2250 mL

## 2023-10-02 LAB — 5 HIAA, QUANTITATIVE, URINE, 24 HOUR
5 HIAA, 24 Hour Urine: 5.6 mg/(24.h) (ref <=6.0)
Total Volume: 2250 mL

## 2023-10-05 ENCOUNTER — Encounter: Payer: Self-pay | Admitting: Internal Medicine

## 2023-10-06 MED ORDER — ONDANSETRON 4 MG PO TBDP: 4.0000 mg | ORAL_TABLET | Freq: Three times a day (TID) | ORAL | 0 refills | Status: DC | PRN

## 2023-11-03 ENCOUNTER — Other Ambulatory Visit: Payer: BC Managed Care – PPO

## 2023-11-06 ENCOUNTER — Other Ambulatory Visit: Payer: Self-pay | Admitting: Internal Medicine

## 2023-11-07 MED ORDER — AMPHETAMINE-DEXTROAMPHETAMINE 20 MG PO TABS: 20.0000 mg | ORAL_TABLET | Freq: Two times a day (BID) | ORAL | 0 refills | Status: DC

## 2023-11-30 ENCOUNTER — Encounter: Payer: Self-pay | Admitting: Internal Medicine

## 2023-12-08 DIAGNOSIS — C20 Malignant neoplasm of rectum: Secondary | ICD-10-CM | POA: Diagnosis not present

## 2023-12-08 DIAGNOSIS — Z933 Colostomy status: Secondary | ICD-10-CM | POA: Diagnosis not present

## 2023-12-16 ENCOUNTER — Other Ambulatory Visit: Payer: Self-pay | Admitting: Internal Medicine

## 2023-12-16 MED ORDER — AMPHETAMINE-DEXTROAMPHETAMINE 20 MG PO TABS: 20.0000 mg | ORAL_TABLET | Freq: Two times a day (BID) | ORAL | 0 refills | Status: DC

## 2024-01-05 ENCOUNTER — Encounter: Payer: Self-pay | Admitting: Internal Medicine

## 2024-01-05 DIAGNOSIS — Z1283 Encounter for screening for malignant neoplasm of skin: Secondary | ICD-10-CM

## 2024-01-12 DIAGNOSIS — H1045 Other chronic allergic conjunctivitis: Secondary | ICD-10-CM | POA: Diagnosis not present

## 2024-01-12 DIAGNOSIS — J301 Allergic rhinitis due to pollen: Secondary | ICD-10-CM | POA: Diagnosis not present

## 2024-01-12 DIAGNOSIS — J3089 Other allergic rhinitis: Secondary | ICD-10-CM | POA: Diagnosis not present

## 2024-01-12 DIAGNOSIS — R052 Subacute cough: Secondary | ICD-10-CM | POA: Diagnosis not present

## 2024-01-22 DIAGNOSIS — J3089 Other allergic rhinitis: Secondary | ICD-10-CM | POA: Diagnosis not present

## 2024-01-22 DIAGNOSIS — J301 Allergic rhinitis due to pollen: Secondary | ICD-10-CM | POA: Diagnosis not present

## 2024-01-22 DIAGNOSIS — J3081 Allergic rhinitis due to animal (cat) (dog) hair and dander: Secondary | ICD-10-CM | POA: Diagnosis not present

## 2024-02-05 DIAGNOSIS — J3081 Allergic rhinitis due to animal (cat) (dog) hair and dander: Secondary | ICD-10-CM | POA: Diagnosis not present

## 2024-02-05 DIAGNOSIS — J301 Allergic rhinitis due to pollen: Secondary | ICD-10-CM | POA: Diagnosis not present

## 2024-02-05 DIAGNOSIS — J3089 Other allergic rhinitis: Secondary | ICD-10-CM | POA: Diagnosis not present

## 2024-02-09 DIAGNOSIS — J3089 Other allergic rhinitis: Secondary | ICD-10-CM | POA: Diagnosis not present

## 2024-02-09 DIAGNOSIS — J301 Allergic rhinitis due to pollen: Secondary | ICD-10-CM | POA: Diagnosis not present

## 2024-02-09 DIAGNOSIS — J3081 Allergic rhinitis due to animal (cat) (dog) hair and dander: Secondary | ICD-10-CM | POA: Diagnosis not present

## 2024-02-12 DIAGNOSIS — L821 Other seborrheic keratosis: Secondary | ICD-10-CM | POA: Diagnosis not present

## 2024-02-12 DIAGNOSIS — L814 Other melanin hyperpigmentation: Secondary | ICD-10-CM | POA: Diagnosis not present

## 2024-02-12 DIAGNOSIS — D225 Melanocytic nevi of trunk: Secondary | ICD-10-CM | POA: Diagnosis not present

## 2024-02-12 DIAGNOSIS — K130 Diseases of lips: Secondary | ICD-10-CM | POA: Diagnosis not present

## 2024-02-16 DIAGNOSIS — J301 Allergic rhinitis due to pollen: Secondary | ICD-10-CM | POA: Diagnosis not present

## 2024-02-16 DIAGNOSIS — J3081 Allergic rhinitis due to animal (cat) (dog) hair and dander: Secondary | ICD-10-CM | POA: Diagnosis not present

## 2024-02-16 DIAGNOSIS — J3089 Other allergic rhinitis: Secondary | ICD-10-CM | POA: Diagnosis not present

## 2024-02-23 DIAGNOSIS — J3081 Allergic rhinitis due to animal (cat) (dog) hair and dander: Secondary | ICD-10-CM | POA: Diagnosis not present

## 2024-02-23 DIAGNOSIS — C20 Malignant neoplasm of rectum: Secondary | ICD-10-CM | POA: Diagnosis not present

## 2024-02-23 DIAGNOSIS — J301 Allergic rhinitis due to pollen: Secondary | ICD-10-CM | POA: Diagnosis not present

## 2024-02-23 DIAGNOSIS — Z933 Colostomy status: Secondary | ICD-10-CM | POA: Diagnosis not present

## 2024-02-23 DIAGNOSIS — J3089 Other allergic rhinitis: Secondary | ICD-10-CM | POA: Diagnosis not present

## 2024-03-01 DIAGNOSIS — J3089 Other allergic rhinitis: Secondary | ICD-10-CM | POA: Diagnosis not present

## 2024-03-01 DIAGNOSIS — J3081 Allergic rhinitis due to animal (cat) (dog) hair and dander: Secondary | ICD-10-CM | POA: Diagnosis not present

## 2024-03-01 DIAGNOSIS — J301 Allergic rhinitis due to pollen: Secondary | ICD-10-CM | POA: Diagnosis not present

## 2024-03-08 DIAGNOSIS — J301 Allergic rhinitis due to pollen: Secondary | ICD-10-CM | POA: Diagnosis not present

## 2024-03-08 DIAGNOSIS — J3081 Allergic rhinitis due to animal (cat) (dog) hair and dander: Secondary | ICD-10-CM | POA: Diagnosis not present

## 2024-03-08 DIAGNOSIS — J3089 Other allergic rhinitis: Secondary | ICD-10-CM | POA: Diagnosis not present

## 2024-03-15 DIAGNOSIS — J3081 Allergic rhinitis due to animal (cat) (dog) hair and dander: Secondary | ICD-10-CM | POA: Diagnosis not present

## 2024-03-15 DIAGNOSIS — J301 Allergic rhinitis due to pollen: Secondary | ICD-10-CM | POA: Diagnosis not present

## 2024-03-15 DIAGNOSIS — J3089 Other allergic rhinitis: Secondary | ICD-10-CM | POA: Diagnosis not present

## 2024-03-16 ENCOUNTER — Encounter: Payer: Self-pay | Admitting: Internal Medicine

## 2024-03-16 ENCOUNTER — Ambulatory Visit: Payer: BC Managed Care – PPO | Admitting: Internal Medicine

## 2024-03-16 VITALS — BP 118/72 | HR 84 | Resp 16 | Ht 61.5 in | Wt 216.0 lb

## 2024-03-16 DIAGNOSIS — R519 Headache, unspecified: Secondary | ICD-10-CM

## 2024-03-16 DIAGNOSIS — R7301 Impaired fasting glucose: Secondary | ICD-10-CM | POA: Diagnosis not present

## 2024-03-16 DIAGNOSIS — Z933 Colostomy status: Secondary | ICD-10-CM

## 2024-03-16 DIAGNOSIS — G4710 Hypersomnia, unspecified: Secondary | ICD-10-CM

## 2024-03-16 DIAGNOSIS — E781 Pure hyperglyceridemia: Secondary | ICD-10-CM | POA: Diagnosis not present

## 2024-03-16 DIAGNOSIS — F5112 Insufficient sleep syndrome: Secondary | ICD-10-CM

## 2024-03-16 DIAGNOSIS — Z6839 Body mass index (BMI) 39.0-39.9, adult: Secondary | ICD-10-CM

## 2024-03-16 DIAGNOSIS — K435 Parastomal hernia without obstruction or  gangrene: Secondary | ICD-10-CM

## 2024-03-16 DIAGNOSIS — K760 Fatty (change of) liver, not elsewhere classified: Secondary | ICD-10-CM

## 2024-03-16 DIAGNOSIS — E66812 Obesity, class 2: Secondary | ICD-10-CM

## 2024-03-16 DIAGNOSIS — Z85048 Personal history of other malignant neoplasm of rectum, rectosigmoid junction, and anus: Secondary | ICD-10-CM

## 2024-03-16 LAB — HEMOGLOBIN A1C: Hgb A1c MFr Bld: 5.6 % (ref 4.6–6.5)

## 2024-03-16 LAB — COMPREHENSIVE METABOLIC PANEL WITH GFR
ALT: 28 U/L (ref 0–35)
AST: 19 U/L (ref 0–37)
Albumin: 4 g/dL (ref 3.5–5.2)
Alkaline Phosphatase: 73 U/L (ref 39–117)
BUN: 18 mg/dL (ref 6–23)
CO2: 27 meq/L (ref 19–32)
Calcium: 9 mg/dL (ref 8.4–10.5)
Chloride: 104 meq/L (ref 96–112)
Creatinine, Ser: 0.82 mg/dL (ref 0.40–1.20)
GFR: 92.08 mL/min (ref >60.00)
Glucose, Bld: 94 mg/dL (ref 70–99)
Potassium: 3.9 meq/L (ref 3.5–5.1)
Sodium: 136 meq/L (ref 135–145)
Total Bilirubin: 0.4 mg/dL (ref 0.2–1.2)
Total Protein: 6.7 g/dL (ref 6.0–8.3)

## 2024-03-16 LAB — LIPID PANEL
Cholesterol: 192 mg/dL (ref 0–200)
HDL: 59 mg/dL (ref >39.00)
LDL Cholesterol: 83 mg/dL (ref 0–99)
NonHDL: 133.08
Total CHOL/HDL Ratio: 3
Triglycerides: 252 mg/dL — ABNORMAL HIGH (ref 0.0–149.0)
VLDL: 50.4 mg/dL — ABNORMAL HIGH (ref 0.0–40.0)

## 2024-03-16 LAB — LDL CHOLESTEROL, DIRECT: Direct LDL: 79 mg/dL

## 2024-03-16 LAB — SEDIMENTATION RATE: Sed Rate: 12 mm/h (ref 0–20)

## 2024-03-16 MED ORDER — ESTRADIOL 1 MG PO TABS: 1.0000 mg | ORAL_TABLET | Freq: Every day | ORAL | 1 refills | Status: DC

## 2024-03-16 MED ORDER — DICYCLOMINE HCL 10 MG PO CAPS: 10.0000 mg | ORAL_CAPSULE | Freq: Two times a day (BID) | ORAL | 5 refills | Status: AC

## 2024-03-16 NOTE — Assessment & Plan Note (Signed)
 Seconcdry to narcolepsy managed with adderall  twice daily

## 2024-03-16 NOTE — Assessment & Plan Note (Addendum)
 Managed with  short acting methylphenidate  .twice daily   No changes to regimen today. Refills given

## 2024-03-16 NOTE — Assessment & Plan Note (Signed)
 Bitemporal  occurring 2/week for the last month, since starting allergy injections   Screening for vasculitis negative.   Given her history of rectal CA,  will consider imaging brain  if frequency increases  Lab Results  Component Value Date   ESRSEDRATE 12 03/16/2024

## 2024-03-16 NOTE — Assessment & Plan Note (Signed)
 Body mass index is 40.15 kg/m. She has trouble suppressing appetite and eating junk food and is requesting pharmacotherapy . She is taking adderall  for ADD and management of daytime sleepiness which may indicate recurrence of OSA which she had as a child.  .  Patient did not tolerate trial of Wegovy due to persistent nausea.

## 2024-03-16 NOTE — Progress Notes (Signed)
 Subjective:  Patient ID: Rachael Cowan, female    DOB: [birth year]  Age: 38 y.o. MRN: [id number]  CC: The primary encounter diagnosis was Hypertriglyceridemia. Diagnoses of Impaired fasting glucose, S/P colostomy (HCC), Recurrent headache, Hepatic steatosis, History of rectal cancer, Class 2 severe obesity due to excess calories with serious comorbidity and body mass index (BMI) of 39.0 to 39.9 in adult [facility]), Parastomal hernia without obstruction or gangrene, Primary hypersomnolence disorder, Hypersomnia, and Bilateral headache were also pertinent to this visit.   HPI Rachael Cowan presents for follow up  Chief Complaint  Patient presents with   Medical Management of Chronic Issues   Narcolepsy :   she is using adderall  twice daily,  concentrating well,  and sleeping well   Obesity:  she has been exercising more frequently but having dietary indiscretions frequently    S/p diverting colostomy. In 2013 for management of rectal ca..   Requesting referral to [facility] for reversal of colostomy  RECENT INCREASE IN HEADACHES for the  last several weeks.  . started receiving allergy shots every Tuesday  for the last 2  months  since testing positive. since  and gets a headache afterward. ,  has been averaging 1-2 nights per week .  bilateral temples location,, occasional nausea with vomiting.  No vision changes .   Doesn't drink enough  water.  Last night 's headache was severe but resolved with sleep  Seeing dermatology for rash , using steroid cream    Outpatient Medications Prior to Visit  Medication Sig Dispense Refill   hydrocortisone 2.5 % ointment Apply topically.     ADDERALL  20 MG tablet Take 1 tablet (20 mg total) by mouth 2 (two) times daily. 60 tablet 0   amphetamine -dextroamphetamine  (ADDERALL ) 20 MG tablet Take 1 tablet (20 mg total) by mouth 2 (two) times daily. 60 tablet 0   amphetamine -dextroamphetamine  (ADDERALL ) 20 MG tablet Take 1 tablet (20 mg total) by mouth 2 (two) times  daily. 60 tablet 0   amphetamine -dextroamphetamine  (ADDERALL ) 20 MG tablet Take 1 tablet (20 mg total) by mouth 2 (two) times daily. 60 tablet 0   Docusate Calcium (STOOL SOFTENER PO) Take 1 capsule by mouth daily as needed (constiaption).     EPINEPHrine  0.3 mg/0.3 mL IJ SOAJ injection Inject 0.3 mg into the muscle as needed.     fexofenadine (ALLEGRA ODT) 30 MG disintegrating tablet Take 30 mg by mouth daily.     mometasone (NASONEX) 50 MCG/ACT nasal spray Place 2 sprays into the nose daily.     ondansetron  (ZOFRAN -ODT) 4 MG disintegrating tablet Take 1 tablet (4 mg total) by mouth every 8 (eight) hours as needed for nausea or vomiting. 20 tablet 0   triamcinolone  cream (KENALOG ) 0.1 % APPLY TO AFFECTED AREA TWICE A DAY (Patient taking differently: Apply 1 Application topically daily as needed.) 30 g 0   dicyclomine  (BENTYL ) 10 MG capsule Take 1 capsule (10 mg total) by mouth 2 (two) times daily. 60 capsule 5   estradiol  (ESTRACE ) 1 MG tablet Take 1 tablet (1 mg total) by mouth daily. 90 tablet 1   No facility-administered medications prior to visit.    Review of Systems;  Patient denies  fevers, malaise, unintentional weight loss,  eye pain, sinus congestion and sinus pain, sore throat, dysphagia,  hemoptysis , cough, dyspnea, wheezing, chest pain, palpitations, orthopnea, edema, abdominal pain, nausea, melena, diarrhea, constipation, flank pain, dysuria, hematuria, urinary  Frequency, nocturia, numbness, tingling, seizures,  Focal weakness, Loss of  consciousness,  Tremor, insomnia, depression, anxiety, and suicidal ideation.      Objective:  BP 118/72   Pulse 84   Resp 16   Ht 5' 1.5 (1.562 m)   Wt 216 lb (98 kg)   LMP 10/25/2011   SpO2 99%   BMI 40.15 kg/m   BP Readings from Last 3 Encounters:  03/16/24 118/72  09/15/23 130/84  03/25/23 126/68    Wt Readings from Last 3 Encounters:  03/16/24 216 lb (98 kg)  09/15/23 212 lb 12.8 oz (96.5 kg)  03/25/23 209 lb 12.8 oz (95.2  kg)    Physical Exam Vitals reviewed.  Constitutional:      General: She is not in acute distress.    Appearance: Normal appearance. She is normal weight. She is not ill-appearing, toxic-appearing or diaphoretic.  HENT:     Head: Normocephalic.   Eyes:     General: No scleral icterus.       Right eye: No discharge.        Left eye: No discharge.     Conjunctiva/sclera: Conjunctivae normal.    Cardiovascular:     Rate and Rhythm: Normal rate and regular rhythm.     Heart sounds: Normal heart sounds.  Pulmonary:     Effort: Pulmonary effort is normal. No respiratory distress.     Breath sounds: Normal breath sounds.   Musculoskeletal:        General: Normal range of motion.   Skin:    General: Skin is warm and dry.   Neurological:     General: No focal deficit present.     Mental Status: She is alert and oriented to person, place, and time. Mental status is at baseline.   Psychiatric:        Mood and Affect: Mood normal.        Behavior: Behavior normal.        Thought Content: Thought content normal.        Judgment: Judgment normal.    Lab Results  Component Value Date   HGBA1C 5.6 03/16/2024   HGBA1C 5.7 09/10/2023   HGBA1C 5.7 (H) 08/20/2022    Lab Results  Component Value Date   CREATININE 0.82 03/16/2024   CREATININE 0.89 09/10/2023   CREATININE 0.82 12/09/2022    Lab Results  Component Value Date   WBC 5.2 09/10/2023   HGB 13.4 09/10/2023   HCT 40.4 09/10/2023   PLT 206.0 09/10/2023   GLUCOSE 94 03/16/2024   CHOL 192 03/16/2024   TRIG 252.0 (H) 03/16/2024   HDL 59.00 03/16/2024   LDLDIRECT 79.0 03/16/2024   LDLCALC 83 03/16/2024   ALT 28 03/16/2024   AST 19 03/16/2024   NA 136 03/16/2024   K 3.9 03/16/2024   CL 104 03/16/2024   CREATININE 0.82 03/16/2024   BUN 18 03/16/2024   CO2 27 03/16/2024   TSH 3.49 09/10/2023   HGBA1C 5.6 03/16/2024    US  RT BREAST BX W LOC DEV 1ST LESION IMG BX SPEC US  GUIDE Addendum Date:  05/12/2023 ADDENDUM REPORT: 05/12/2023 08:14 ADDENDUM: PATHOLOGY revealed: Site 1. Breast, right, needle core biopsy, 10 o'clock, 10 cmfn, heart clip- BENIGN BREAST TISSUE WITH NODULAR AREAS OF DENSE FIBROUS STROMA.- NEGATIVE FOR ATYPIA AND MALIGNANCY. Pathology results are CONCORDANT with imaging findings, per Dr. Anitra Barn. PATHOLOGY revealed: Site 2. Breast, right, needle core biopsy, 6 o'clock, 3 cmfn, coil clip- BENIGN BREAST TISSUE WITH FIBROADENOMATOID CHANGE.- NEGATIVE FOR ATYPIA AND MALIGNANCY. Pathology results are CONCORDANT with imaging  findings, per Dr. Anitra Barn Pathology results and recommendations below were discussed with patient by telephone on 05/11/2023. Patient reported biopsy site within normal limits with slight tenderness at the site. Post biopsy care instructions were reviewed, questions were answered and my direct phone number was provided to patient. Patient was instructed to call [facility] if any concerns or questions arise related to the biopsy. RECOMMENDATION: Patient instructed to resume annual bilateral screening mammogram due July 2025. Pathology results reported by Ladonna Pickup RN on 05/11/2023. Electronically Signed   By: Anitra Barn M.D.   On: 05/12/2023 08:14   Result Date: 05/12/2023 CLINICAL DATA:  Patient presents for ultrasound-guided core biopsy RIGHT breast masses. EXAM: ULTRASOUND GUIDED RIGHT BREAST CORE NEEDLE BIOPSY x2 COMPARISON:  Previous exam(s). PROCEDURE: I met with the patient and we discussed the procedure of ultrasound-guided biopsy, including benefits and alternatives. We discussed the high likelihood of a successful procedure. We discussed the risks of the procedure, including infection, bleeding, tissue injury, clip migration, and inadequate sampling. Informed written consent was given. The usual time-out protocol was performed immediately prior to the procedure. Site 1: 10 o'clock RIGHT breast. Lesion quadrant: UPPER OUTER  QUADRANT, heart clip Using sterile technique and lidocaine  and lidocaine  with epinephrine  as local anesthetic, under direct ultrasound visualization, a 14 gauge spring-loaded device was used to perform biopsy of mass in the 10 o'clock location of the RIGHT breast 10 centimeters from the nipple using a inferior to superior approach. At the conclusion of the procedure heart tissue marker clip was deployed into the biopsy cavity. Site 2: 6 o'clock RIGHT breast. Lesion QUADRANT: LOWER central RIGHT breast, coil clip Using sterile technique and lidocaine  and lidocaine  with epinephrine  as local anesthetic, under direct ultrasound visualization, a 14 gauge spring-loaded device was used to perform biopsy of mass in the 6 o'clock location of the RIGHT breast using a LATERAL to MEDIAL approach. At the conclusion of the procedure coil tissue marker clip was deployed into the biopsy cavity. Follow-up 2-view mammogram was performed and dictated separately. IMPRESSION: Ultrasound guided biopsy of 2 masses in the RIGHT breast. No apparent complications. Electronically Signed: By: Anitra Barn M.D. On: 05/08/2023 08:58   US  RT BREAST BX W LOC DEV EA ADD LESION IMG BX SPEC US  GUIDE Addendum Date: 05/12/2023 ADDENDUM REPORT: 05/12/2023 08:14 ADDENDUM: PATHOLOGY revealed: Site 1. Breast, right, needle core biopsy, 10 o'clock, 10 cmfn, heart clip- BENIGN BREAST TISSUE WITH NODULAR AREAS OF DENSE FIBROUS STROMA.- NEGATIVE FOR ATYPIA AND MALIGNANCY. Pathology results are CONCORDANT with imaging findings, per Dr. Anitra Barn. PATHOLOGY revealed: Site 2. Breast, right, needle core biopsy, 6 o'clock, 3 cmfn, coil clip- BENIGN BREAST TISSUE WITH FIBROADENOMATOID CHANGE.- NEGATIVE FOR ATYPIA AND MALIGNANCY. Pathology results are CONCORDANT with imaging findings, per Dr. Anitra Barn Pathology results and recommendations below were discussed with patient by telephone on 05/11/2023. Patient reported biopsy site within normal limits  with slight tenderness at the site. Post biopsy care instructions were reviewed, questions were answered and my direct phone number was provided to patient. Patient was instructed to call [facility] if any concerns or questions arise related to the biopsy. RECOMMENDATION: Patient instructed to resume annual bilateral screening mammogram due July 2025. Pathology results reported by Ladonna Pickup RN on 05/11/2023. Electronically Signed   By: Anitra Barn M.D.   On: 05/12/2023 08:14   Result Date: 05/12/2023 CLINICAL DATA:  Patient presents for ultrasound-guided core biopsy RIGHT breast masses. EXAM: ULTRASOUND GUIDED RIGHT BREAST CORE NEEDLE  BIOPSY x2 COMPARISON:  Previous exam(s). PROCEDURE: I met with the patient and we discussed the procedure of ultrasound-guided biopsy, including benefits and alternatives. We discussed the high likelihood of a successful procedure. We discussed the risks of the procedure, including infection, bleeding, tissue injury, clip migration, and inadequate sampling. Informed written consent was given. The usual time-out protocol was performed immediately prior to the procedure. Site 1: 10 o'clock RIGHT breast. Lesion quadrant: UPPER OUTER QUADRANT, heart clip Using sterile technique and lidocaine  and lidocaine  with epinephrine  as local anesthetic, under direct ultrasound visualization, a 14 gauge spring-loaded device was used to perform biopsy of mass in the 10 o'clock location of the RIGHT breast 10 centimeters from the nipple using a inferior to superior approach. At the conclusion of the procedure heart tissue marker clip was deployed into the biopsy cavity. Site 2: 6 o'clock RIGHT breast. Lesion QUADRANT: LOWER central RIGHT breast, coil clip Using sterile technique and lidocaine  and lidocaine  with epinephrine  as local anesthetic, under direct ultrasound visualization, a 14 gauge spring-loaded device was used to perform biopsy of mass in the 6 o'clock location of the  RIGHT breast using a LATERAL to MEDIAL approach. At the conclusion of the procedure coil tissue marker clip was deployed into the biopsy cavity. Follow-up 2-view mammogram was performed and dictated separately. IMPRESSION: Ultrasound guided biopsy of 2 masses in the RIGHT breast. No apparent complications. Electronically Signed: By: Anitra Barn M.D. On: 05/08/2023 08:58   MM CLIP PLACEMENT RIGHT Result Date: 05/08/2023 CLINICAL DATA:  Status post ultrasound-guided core biopsy of 2 RIGHT breast masses. EXAM: 3D DIAGNOSTIC RIGHT MAMMOGRAM POST ULTRASOUND BIOPSY x2 COMPARISON:  Previous exam(s). FINDINGS: 3D Mammographic images were obtained following ultrasound guided biopsy of mass the 10 o'clock location of the RIGHT breast and placement of a heart shaped clip. The biopsy marking clip is in expected position at the site of biopsy. Following biopsy of mass in the 6 o'clock location of the RIGHT breast, a coil shaped clip was placed and is identified in the expected location. IMPRESSION: Tissue marker clips are in the expected locations after biopsy. Final Assessment: Post Procedure Mammograms for Marker Placement Electronically Signed   By: Anitra Barn M.D.   On: 05/08/2023 09:04    Assessment & Plan:  .Hypertriglyceridemia -     Lipid panel -     LDL cholesterol, direct -     Comprehensive metabolic panel with GFR  Impaired fasting glucose -     Hemoglobin A1c  S/P colostomy (HCC) -     Ambulatory referral to General Surgery  Recurrent headache -     Sedimentation rate  Hepatic steatosis Assessment & Plan: Presumed by ultrasound changes and negative serologies to rule out autoimmune causes of hepatitis.  Current liver enzymes are  normalized  all modifiable risk factors including obesity, screening for diabetes and hyperlipidemia have been addressed   Lab Results  Component Value Date   ALT 28 03/16/2024   AST 19 03/16/2024   ALKPHOS 73 03/16/2024   BILITOT 0.4 03/16/2024       History of rectal cancer Assessment & Plan:  stage IIIB (T4N1M0) rectal cancer s/p neoadjuvant radiation and 5FU (completed on 03/07/2012) followed by rectosigmoidectomy, hysterectomy and oophorectomy on 05/13/2012 at [facility].  She has annual CEAs and CT scans by Oncology.      Class 2 severe obesity due to excess calories with serious comorbidity and body mass index (BMI) of 39.0 to 39.9 in adult [facility]) Assessment & Plan: Body  mass index is 40.15 kg/m. She has trouble suppressing appetite and eating junk food and is requesting pharmacotherapy . She is taking adderall  for ADD and management of daytime sleepiness which may indicate recurrence of OSA which she had as a child.  .  Patient did not tolerate trial of Wegovy due to persistent nausea.    Parastomal hernia without obstruction or gangrene Assessment & Plan: Noted on recent CT Abd/pelvis during workup for abdominal pain now resolved.  H/o partial colectomy and colostomy done remotely at [facility], patient has seen local Gen Surg, surgery deferred but now requesting referral to outpatient ostomy clinic at [facility]    Primary hypersomnolence disorder Assessment & Plan: Managed with  short acting methylphenidate  .twice daily   No changes to regimen today. Refills given   Hypersomnia Assessment & Plan: Seconcdry to narcolepsy managed with adderall  twice daily    Bilateral headache Assessment & Plan: Bitemporal  occurring 2/week for the last month, since starting allergy injections   Screening for vasculitis negative.   Given her history of rectal CA,  will consider imaging brain  if frequency increases  Lab Results  Component Value Date   ESRSEDRATE 12 03/16/2024      Other orders -     Dicyclomine  HCl; Take 1 capsule (10 mg total) by mouth 2 (two) times daily.  Dispense: 60 capsule; Refill: 5 -     Estradiol ; Take 1 tablet (1 mg total) by mouth daily.  Dispense: 90 tablet; Refill: 1     I spent 34 minutes on the day of  this face to face encounter reviewing patient's  most recent visit with oncology,   prior relevant surgical and non surgical procedures, recent  labs and imaging studies, counseling on weight management,  reviewing the assessment and plan with patient, and post visit ordering and reviewing of  diagnostics and therapeutics with patient  .   Follow-up: No follow-ups on file.   Thersia Flax, MD

## 2024-03-16 NOTE — Assessment & Plan Note (Signed)
 stage IIIB (T4N1M0) rectal cancer s/p neoadjuvant radiation and 5FU (completed on 03/07/2012) followed by rectosigmoidectomy, hysterectomy and oophorectomy on 05/13/2012 at [facility].  She has annual CEAs and CT scans by Oncology.

## 2024-03-16 NOTE — Patient Instructions (Addendum)
 REFERRAL TO [facility]    You might want to try using Relaxium for insomnia  (as seen on TV commercials) . It is available through [employer] and contains all natural supplements:  Melatonin 5 mg  Chamomile 25 mg Passionflower extract 75 mg GABA 100 mg Ashwaganda extract 125 mg Magnesium  citrate, glycinate, oxide (100 mg)  L tryptophan 500 mg Valerest (proprietary  ingredient ; probably valeria root extract)

## 2024-03-16 NOTE — Assessment & Plan Note (Addendum)
 Noted on recent CT Abd/pelvis during workup for abdominal pain now resolved.  H/o partial colectomy and colostomy done remotely at [facility], patient has seen local Gen Surg, surgery deferred but now requesting referral to outpatient ostomy clinic at [facility]

## 2024-03-16 NOTE — Assessment & Plan Note (Signed)
 Presumed by ultrasound changes and negative serologies to rule out autoimmune causes of hepatitis.  Current liver enzymes are  normalized  all modifiable risk factors including obesity, screening for diabetes and hyperlipidemia have been addressed   Lab Results  Component Value Date   ALT 28 03/16/2024   AST 19 03/16/2024   ALKPHOS 73 03/16/2024   BILITOT 0.4 03/16/2024

## 2024-03-18 ENCOUNTER — Ambulatory Visit: Payer: Self-pay | Admitting: Internal Medicine

## 2024-03-22 DIAGNOSIS — J3089 Other allergic rhinitis: Secondary | ICD-10-CM | POA: Diagnosis not present

## 2024-03-22 DIAGNOSIS — J301 Allergic rhinitis due to pollen: Secondary | ICD-10-CM | POA: Diagnosis not present

## 2024-03-22 DIAGNOSIS — J3081 Allergic rhinitis due to animal (cat) (dog) hair and dander: Secondary | ICD-10-CM | POA: Diagnosis not present

## 2024-03-29 DIAGNOSIS — J301 Allergic rhinitis due to pollen: Secondary | ICD-10-CM | POA: Diagnosis not present

## 2024-03-29 DIAGNOSIS — J3089 Other allergic rhinitis: Secondary | ICD-10-CM | POA: Diagnosis not present

## 2024-03-29 DIAGNOSIS — J3081 Allergic rhinitis due to animal (cat) (dog) hair and dander: Secondary | ICD-10-CM | POA: Diagnosis not present

## 2024-04-04 ENCOUNTER — Telehealth: Payer: Self-pay

## 2024-04-04 NOTE — Telephone Encounter (Signed)
 Copied from CRM [id number]. Topic: General - Other >> Apr 04, 2024 10:40 AM Ernestene P wrote: Reason for CRM: [employer] checking status for clinical notes request that was fax over on 07/2 -

## 2024-04-05 DIAGNOSIS — J3089 Other allergic rhinitis: Secondary | ICD-10-CM | POA: Diagnosis not present

## 2024-04-05 DIAGNOSIS — J3081 Allergic rhinitis due to animal (cat) (dog) hair and dander: Secondary | ICD-10-CM | POA: Diagnosis not present

## 2024-04-05 DIAGNOSIS — J301 Allergic rhinitis due to pollen: Secondary | ICD-10-CM | POA: Diagnosis not present

## 2024-04-05 NOTE — Telephone Encounter (Signed)
 Clinical notes have been faxed

## 2024-04-06 ENCOUNTER — Telehealth: Payer: Self-pay | Admitting: Internal Medicine

## 2024-04-06 MED ORDER — AMPHETAMINE-DEXTROAMPHETAMINE 20 MG PO TABS: 20.0000 mg | ORAL_TABLET | Freq: Two times a day (BID) | ORAL | 0 refills | Status: DC

## 2024-04-06 NOTE — Telephone Encounter (Signed)
 Patient called, left VM to return the call to the office to speak to NT as note states she needs appt for further refills on her medication

## 2024-04-06 NOTE — Telephone Encounter (Signed)
 The patient called back returning a call from NT. I transferred her to [place] with NT

## 2024-04-06 NOTE — Telephone Encounter (Signed)
 Copied from CRM [id number]. Topic: Clinical - Medication Refill >> Apr 06, 2024  2:29 PM Franky GRADE wrote: Medication: amphetamine -dextroamphetamine  (ADDERALL ) 20 MG tablet [[id number]]  Has the patient contacted their pharmacy? Yes, they asked patient to contact the ordering provider.  (Agent: If no, request that the patient contact the pharmacy for the refill. If patient does not wish to contact the pharmacy document the reason why and proceed with request.) (Agent: If yes, when and what did the pharmacy advise?)  This is the patient's preferred pharmacy:  [id number] [place], [place] - [address] [place] [address] Phone: [phone] Fax: [phone]  Is this the correct pharmacy for this prescription? Yes If no, delete pharmacy and type the correct one.   Has the prescription been filled recently? No  Is the patient out of the medication? Yes  Has the patient been seen for an appointment in the last year OR does the patient have an upcoming appointment? Yes  Can we respond through MyChart? Yes  Agent: Please be advised that Rx refills may take up to 3 business days. We ask that you follow-up with your pharmacy.

## 2024-04-06 NOTE — Telephone Encounter (Signed)
 Spoke with pharmacy and they stated that they only received 1 rx.

## 2024-04-06 NOTE — Telephone Encounter (Signed)
 Adderall   Refilled: 12/16/2023 Last OV: 03/16/2024 Next OV: 09/16/2024

## 2024-04-07 MED ORDER — AMPHETAMINE-DEXTROAMPHETAMINE 20 MG PO TABS: 20.0000 mg | ORAL_TABLET | Freq: Two times a day (BID) | ORAL | 0 refills | Status: DC

## 2024-04-07 NOTE — Addendum Note (Signed)
 Addended by: MARYLYNN VERNEITA CROME on: 04/07/2024 01:17 PM   Modules accepted: Orders

## 2024-04-11 DIAGNOSIS — C20 Malignant neoplasm of rectum: Secondary | ICD-10-CM | POA: Diagnosis not present

## 2024-04-11 DIAGNOSIS — Z933 Colostomy status: Secondary | ICD-10-CM | POA: Diagnosis not present

## 2024-04-12 DIAGNOSIS — J3089 Other allergic rhinitis: Secondary | ICD-10-CM | POA: Diagnosis not present

## 2024-04-12 DIAGNOSIS — J3081 Allergic rhinitis due to animal (cat) (dog) hair and dander: Secondary | ICD-10-CM | POA: Diagnosis not present

## 2024-04-12 DIAGNOSIS — J301 Allergic rhinitis due to pollen: Secondary | ICD-10-CM | POA: Diagnosis not present

## 2024-04-15 DIAGNOSIS — K435 Parastomal hernia without obstruction or  gangrene: Secondary | ICD-10-CM | POA: Diagnosis not present

## 2024-04-15 DIAGNOSIS — Z433 Encounter for attention to colostomy: Secondary | ICD-10-CM | POA: Diagnosis not present

## 2024-04-19 DIAGNOSIS — J3089 Other allergic rhinitis: Secondary | ICD-10-CM | POA: Diagnosis not present

## 2024-04-19 DIAGNOSIS — J3081 Allergic rhinitis due to animal (cat) (dog) hair and dander: Secondary | ICD-10-CM | POA: Diagnosis not present

## 2024-04-19 DIAGNOSIS — J301 Allergic rhinitis due to pollen: Secondary | ICD-10-CM | POA: Diagnosis not present

## 2024-04-26 DIAGNOSIS — J3089 Other allergic rhinitis: Secondary | ICD-10-CM | POA: Diagnosis not present

## 2024-04-26 DIAGNOSIS — J3081 Allergic rhinitis due to animal (cat) (dog) hair and dander: Secondary | ICD-10-CM | POA: Diagnosis not present

## 2024-04-26 DIAGNOSIS — J301 Allergic rhinitis due to pollen: Secondary | ICD-10-CM | POA: Diagnosis not present

## 2024-05-03 DIAGNOSIS — J3081 Allergic rhinitis due to animal (cat) (dog) hair and dander: Secondary | ICD-10-CM | POA: Diagnosis not present

## 2024-05-03 DIAGNOSIS — J3089 Other allergic rhinitis: Secondary | ICD-10-CM | POA: Diagnosis not present

## 2024-05-03 DIAGNOSIS — J301 Allergic rhinitis due to pollen: Secondary | ICD-10-CM | POA: Diagnosis not present

## 2024-05-06 DIAGNOSIS — J301 Allergic rhinitis due to pollen: Secondary | ICD-10-CM | POA: Diagnosis not present

## 2024-05-06 DIAGNOSIS — J3081 Allergic rhinitis due to animal (cat) (dog) hair and dander: Secondary | ICD-10-CM | POA: Diagnosis not present

## 2024-05-06 DIAGNOSIS — J3089 Other allergic rhinitis: Secondary | ICD-10-CM | POA: Diagnosis not present

## 2024-05-17 DIAGNOSIS — J3081 Allergic rhinitis due to animal (cat) (dog) hair and dander: Secondary | ICD-10-CM | POA: Diagnosis not present

## 2024-05-17 DIAGNOSIS — J3089 Other allergic rhinitis: Secondary | ICD-10-CM | POA: Diagnosis not present

## 2024-05-17 DIAGNOSIS — J301 Allergic rhinitis due to pollen: Secondary | ICD-10-CM | POA: Diagnosis not present

## 2024-05-24 DIAGNOSIS — J3089 Other allergic rhinitis: Secondary | ICD-10-CM | POA: Diagnosis not present

## 2024-05-24 DIAGNOSIS — J301 Allergic rhinitis due to pollen: Secondary | ICD-10-CM | POA: Diagnosis not present

## 2024-05-24 DIAGNOSIS — J3081 Allergic rhinitis due to animal (cat) (dog) hair and dander: Secondary | ICD-10-CM | POA: Diagnosis not present

## 2024-05-27 DIAGNOSIS — J3089 Other allergic rhinitis: Secondary | ICD-10-CM | POA: Diagnosis not present

## 2024-05-27 DIAGNOSIS — J3081 Allergic rhinitis due to animal (cat) (dog) hair and dander: Secondary | ICD-10-CM | POA: Diagnosis not present

## 2024-05-27 DIAGNOSIS — R052 Subacute cough: Secondary | ICD-10-CM | POA: Diagnosis not present

## 2024-05-27 DIAGNOSIS — H1045 Other chronic allergic conjunctivitis: Secondary | ICD-10-CM | POA: Diagnosis not present

## 2024-05-27 DIAGNOSIS — J301 Allergic rhinitis due to pollen: Secondary | ICD-10-CM | POA: Diagnosis not present

## 2024-05-31 DIAGNOSIS — J301 Allergic rhinitis due to pollen: Secondary | ICD-10-CM | POA: Diagnosis not present

## 2024-05-31 DIAGNOSIS — J3081 Allergic rhinitis due to animal (cat) (dog) hair and dander: Secondary | ICD-10-CM | POA: Diagnosis not present

## 2024-05-31 DIAGNOSIS — J3089 Other allergic rhinitis: Secondary | ICD-10-CM | POA: Diagnosis not present

## 2024-06-06 ENCOUNTER — Other Ambulatory Visit: Payer: Self-pay | Admitting: Internal Medicine

## 2024-06-06 MED ORDER — ADDERALL 20 MG PO TABS: 20.0000 mg | ORAL_TABLET | Freq: Two times a day (BID) | ORAL | 0 refills | Status: DC

## 2024-06-06 NOTE — Telephone Encounter (Signed)
 Refilled: 04/07/2024 Last OV: 03/16/2024 Next OV: 09/16/2024

## 2024-06-06 NOTE — Telephone Encounter (Unsigned)
 Copied from CRM #[id number]. Topic: Clinical - Medication Refill >> Jun 06, 2024 12:49 PM Roselie C wrote: Medication:  amphetamine -dextroamphetamine  (ADDERALL ) 20 MG tablet   Has the patient contacted their pharmacy? Yes (Agent: If no, request that the patient contact the pharmacy for the refill. If patient does not wish to contact the pharmacy document the reason why and proceed with request.) (Agent: If yes, when and what did the pharmacy advise?)  This is the patient's preferred pharmacy:  [id number] [place], [place] - [address] [place] [address] Phone: [phone] Fax: [phone]  Is this the correct pharmacy for this prescription? Yes If no, delete pharmacy and type the correct one.   Has the prescription been filled recently? Yes  Is the patient out of the medication? Yes  Has the patient been seen for an appointment in the last year OR does the patient have an upcoming appointment? Yes  Can we respond through MyChart? Yes  Agent: Please be advised that Rx refills may take up to 3 business days. We ask that you follow-up with your pharmacy.

## 2024-06-07 DIAGNOSIS — J301 Allergic rhinitis due to pollen: Secondary | ICD-10-CM | POA: Diagnosis not present

## 2024-06-07 DIAGNOSIS — J3081 Allergic rhinitis due to animal (cat) (dog) hair and dander: Secondary | ICD-10-CM | POA: Diagnosis not present

## 2024-06-07 DIAGNOSIS — J3089 Other allergic rhinitis: Secondary | ICD-10-CM | POA: Diagnosis not present

## 2024-06-12 ENCOUNTER — Encounter: Payer: Self-pay | Admitting: Internal Medicine

## 2024-06-13 ENCOUNTER — Other Ambulatory Visit: Payer: Self-pay | Admitting: Internal Medicine

## 2024-06-13 MED ORDER — AMPHETAMINE-DEXTROAMPHETAMINE 20 MG PO TABS: 20.0000 mg | ORAL_TABLET | Freq: Two times a day (BID) | ORAL | 0 refills | Status: DC

## 2024-06-13 NOTE — Telephone Encounter (Signed)
  Last Visit: 03/16/2024 Next Visit: 09/16/2024   Please Advise

## 2024-06-14 DIAGNOSIS — C20 Malignant neoplasm of rectum: Secondary | ICD-10-CM | POA: Diagnosis not present

## 2024-06-14 DIAGNOSIS — Z933 Colostomy status: Secondary | ICD-10-CM | POA: Diagnosis not present

## 2024-06-24 DIAGNOSIS — J3089 Other allergic rhinitis: Secondary | ICD-10-CM | POA: Diagnosis not present

## 2024-06-24 DIAGNOSIS — J4520 Mild intermittent asthma, uncomplicated: Secondary | ICD-10-CM | POA: Diagnosis not present

## 2024-06-24 DIAGNOSIS — H1045 Other chronic allergic conjunctivitis: Secondary | ICD-10-CM | POA: Diagnosis not present

## 2024-06-24 DIAGNOSIS — J301 Allergic rhinitis due to pollen: Secondary | ICD-10-CM | POA: Diagnosis not present

## 2024-06-24 DIAGNOSIS — J3081 Allergic rhinitis due to animal (cat) (dog) hair and dander: Secondary | ICD-10-CM | POA: Diagnosis not present

## 2024-07-18 ENCOUNTER — Telehealth: Payer: Self-pay | Admitting: Internal Medicine

## 2024-07-18 NOTE — Telephone Encounter (Unsigned)
 Copied from CRM #[id number]. Topic: Clinical - Medication Refill >> Jul 18, 2024 10:34 AM Vena HERO wrote: Medication: ADDERALL  20 MG tablet  Has the patient contacted their pharmacy? Yes (Agent: If no, request that the patient contact the pharmacy for the refill. If patient does not wish to contact the pharmacy document the reason why and proceed with request.) (Agent: If yes, when and what did the pharmacy advise?) No refills  This is the patient's preferred pharmacy:  [id number] [place], [place] - [address] [place] [address] Phone: [phone] Fax: [phone]  Is this the correct pharmacy for this prescription? Yes If no, delete pharmacy and type the correct one.   Has the prescription been filled recently? No  Is the patient out of the medication? No  Has the patient been seen for an appointment in the last year OR does the patient have an upcoming appointment? Yes  Can we respond through MyChart? Yes  Agent: Please be advised that Rx refills may take up to 3 business days. We ask that you follow-up with your pharmacy.

## 2024-07-18 NOTE — Telephone Encounter (Signed)
 Refilled: 06/06/2024 Last OV: 03/16/2024 Next OV: 09/16/2024

## 2024-07-19 DIAGNOSIS — J3089 Other allergic rhinitis: Secondary | ICD-10-CM | POA: Diagnosis not present

## 2024-07-19 DIAGNOSIS — J301 Allergic rhinitis due to pollen: Secondary | ICD-10-CM | POA: Diagnosis not present

## 2024-07-19 DIAGNOSIS — J3081 Allergic rhinitis due to animal (cat) (dog) hair and dander: Secondary | ICD-10-CM | POA: Diagnosis not present

## 2024-07-19 MED ORDER — ADDERALL 20 MG PO TABS: 20.0000 mg | ORAL_TABLET | Freq: Two times a day (BID) | ORAL | 0 refills | Status: DC

## 2024-07-22 ENCOUNTER — Other Ambulatory Visit: Payer: Self-pay | Admitting: Internal Medicine

## 2024-07-22 MED ORDER — ADDERALL 20 MG PO TABS: 20.0000 mg | ORAL_TABLET | Freq: Two times a day (BID) | ORAL | 0 refills | Status: DC

## 2024-07-22 NOTE — Telephone Encounter (Unsigned)
 Copied from CRM [id number]. Topic: Clinical - Prescription Issue >> Jul 22, 2024 11:36 AM Deaijah H wrote: Reason for CRM: Patient called in due to incorrect script being sent to pharmacy, she is not needing the non-generic Adderall  sent, she needs amphetamine -dextroamphetamine  (ADDERALL ) 20 MG tablet (GENERIC) sent in, but would like to know if she can pick up 2nd fill from 9/15 3 month supply that was sent or if a new supply would have to be sent in for the month pf October if not. Please call [phone]

## 2024-07-22 NOTE — Telephone Encounter (Signed)
PT NOTIFIED VIA MY CHART

## 2024-07-25 ENCOUNTER — Other Ambulatory Visit: Payer: Self-pay | Admitting: Internal Medicine

## 2024-07-25 MED ORDER — AMPHETAMINE-DEXTROAMPHETAMINE 20 MG PO TABS: 20.0000 mg | ORAL_TABLET | Freq: Two times a day (BID) | ORAL | 0 refills | Status: DC

## 2024-07-25 NOTE — Telephone Encounter (Unsigned)
 Copied from CRM [id number]. Topic: Clinical - Prescription Issue >> Jul 25, 2024  1:54 PM Rachael Cow[place] wrote: Patient said that she needs the Generic Aderrall because her insurance does not cover Brand Names -please contact patient

## 2024-07-26 NOTE — Telephone Encounter (Signed)
 LMTCB. Please let pt know that the generic adderrall has been sent in.

## 2024-08-04 ENCOUNTER — Ambulatory Visit: Admission: RE | Admit: 2024-08-04 | Discharge: 2024-08-04 | Disposition: A | Discharge status: Home / Self Care

## 2024-08-04 VITALS — BP 129/87 | HR 97 | Temp 98.0°F | Resp 18

## 2024-08-04 DIAGNOSIS — J0100 Acute maxillary sinusitis, unspecified: Secondary | ICD-10-CM

## 2024-08-04 MED ORDER — SULFAMETHOXAZOLE-TRIMETHOPRIM 800-160 MG PO TABS: 1.0000 | ORAL_TABLET | Freq: Two times a day (BID) | ORAL | 0 refills | Status: AC

## 2024-08-04 NOTE — ED Triage Notes (Signed)
 Patient to Urgent Care with complaints of nasal congestion/ facial pressure/ green mucus production. Low grade fevers at the beginning of illness.   Symptoms x10 days.   Meds: Liquid decongestant

## 2024-08-04 NOTE — Discharge Instructions (Addendum)
Take the Bactrim as directed.  Follow up with your primary care provider if your symptoms are not improving.    

## 2024-08-04 NOTE — ED Provider Notes (Signed)
 Rachael Cowan    CSN: [id number] Arrival date & time: 08/04/24  1853      History   Chief Complaint Chief Complaint  Patient presents with   Nasal Congestion    This began Sunday, October 26th. I had a what I thought was a basic virus. Had a low grade fever the following Monday, October 27th in the evening. No fever since. I think I now have a sinus infection. Current: green mucus & sinus pressure. - Entered by patient    HPI Rachael Cowan is a 38 y.o. female.  Patient presents with 2-week history of congestion, sinus pressure, sinus pain, yellow-green nasal mucus.  She reports low-grade fever at the onset of her symptoms but none in the last week and a half.  Mild occasional cough.  No wheezing or shortness of breath.  No OTC medications taken today.  The history is provided by the patient and medical records.    Past Medical History:  Diagnosis Date   Allergy    Asthma    exercise induced/allergy induced   Bowel obstruction (HCC) 2024   possible?- being evaluated bi GI/sx   Colon cancer (HCC) 12/2011   Rectal Cancer   Colon polyp    Gallstones    Hypersomnolence disorder 2005   managed with metidate   Obesity    Rectal cancer (HCC) 05/13/2012   Resection with colostomy and hysterectomy(preventative)   Sleep apnea 2005   resolved with ENT surgery,  Wilmer Hasten    Patient Active Problem List   Diagnosis Date Noted   Bilateral headache 03/16/2024   History of benign breast biopsy 09/15/2023   Parastomal hernia without obstruction or gangrene 04/07/2022   Hypertriglyceridemia 08/05/2020   Encounter for monitoring estrogen replacement therapy following surgical menopause 07/15/2019   S/P colostomy (HCC) 01/30/2019   Educated about COVID-19 virus infection 01/30/2019   Abnormal physical evaluation 06/17/2017   S/P cholecystectomy 09/10/2016   History of hematuria 07/19/2016   Hepatic steatosis 07/19/2016   B12 deficiency 06/23/2013   Obesity 06/23/2013    Other and unspecified hyperlipidemia 06/23/2013   Menopause 05/13/2012   Hypersomnia 05/07/2012   Asthma 05/07/2012   History of rectal cancer 01/13/2012    Past Surgical History:  Procedure Laterality Date   ABDOMINAL HYSTERECTOMY  05/13/2012   ARTHROSCOPIC REPAIR ACL  2006   right knee Dr Honora at [facility]   BREAST BIOPSY Right 05/08/2023   2 areas/path pending   BREAST BIOPSY Right 05/08/2023   US  RT BREAST BX W LOC DEV EA ADD LESION IMG BX SPEC US  GUIDE 05/08/2023 [facility]   BREAST BIOPSY Right 05/08/2023   US  RT BREAST BX W LOC DEV 1ST LESION IMG BX SPEC US  GUIDE 05/08/2023 [facility]   CHOLECYSTECTOMY N/A 07/21/2016   Procedure: LAPAROSCOPIC CHOLECYSTECTOMY CONVERTED TO OPEN;  Surgeon: Aloysius Plant, MD;  Location: [facility];  Service: General;  Laterality: N/A;   CHOLECYSTECTOMY  07/21/2016   COLON SURGERY  05/13/2012   COLONOSCOPY WITH PROPOFOL  N/A 12/14/2015   Procedure: COLONOSCOPY WITH PROPOFOL ;  Surgeon: Lamar ONEIDA Holmes, MD;  Location: [facility] ENDOSCOPY;  Service: Endoscopy;  Laterality: N/A;   Colostomy Placement  2013   HERNIA REPAIR  07/21/2016   surgical hernia was repaired during my gall bladder removal.   PORTA CATH REMOVAL N/A 11/06/2016   Procedure: Pat Cath Removal;  Surgeon: Selinda GORMAN Gu, MD;  Location: [facility] INVASIVE CV LAB;  Service: Cardiovascular;  Laterality: N/A;   TONSILLECTOMY AND ADENOIDECTOMY  2005  UMBILICAL HERNIA REPAIR  06/2016    OB History   No obstetric history on file.      Home Medications    Prior to Admission medications   Medication Sig Start Date End Date Taking? Authorizing Provider  levocetirizine (XYZAL ) 5 MG tablet Take 5 mg by mouth every evening.   Yes [provider]  montelukast  (SINGULAIR ) 10 MG tablet 1 tablet Orally Once a day; Duration: 30 days 01/12/24  Yes [provider]  sulfamethoxazole -trimethoprim (BACTRIM DS) 800-160 MG tablet Take 1 tablet by mouth 2 (two) times daily for 7 days.  08/04/24 08/11/24 Yes Corlis Burnard DEL, NP  ADDERALL  20 MG tablet Take 1 tablet (20 mg total) by mouth 2 (two) times daily. 07/22/24   Marylynn Verneita CROME, MD  amphetamine -dextroamphetamine  (ADDERALL ) 20 MG tablet Take 1 tablet (20 mg total) by mouth 2 (two) times daily. 07/25/24 08/24/24  Marylynn Verneita CROME, MD  amphetamine -dextroamphetamine  (ADDERALL ) 20 MG tablet Take 1 tablet (20 mg total) by mouth 2 (two) times daily. 07/25/24 08/24/24  Marylynn Verneita CROME, MD  dicyclomine  (BENTYL ) 10 MG capsule Take 1 capsule (10 mg total) by mouth 2 (two) times daily. 03/16/24   Tullo, Teresa L, MD  Docusate Calcium (STOOL SOFTENER PO) Take 1 capsule by mouth daily as needed (constiaption).    [provider]  EPINEPHrine  0.3 mg/0.3 mL IJ SOAJ injection Inject 0.3 mg into the muscle as needed.    [provider]  estradiol  (ESTRACE ) 1 MG tablet Take 1 tablet (1 mg total) by mouth daily. 03/16/24   Tullo, Teresa L, MD  fexofenadine (ALLEGRA ODT) 30 MG disintegrating tablet Take 30 mg by mouth daily. Patient not taking: Reported on 08/04/2024    [provider]  hydrocortisone 2.5 % ointment Apply topically. 02/12/24   [provider]  mometasone (NASONEX) 50 MCG/ACT nasal spray Place 2 sprays into the nose daily.    [provider]  ondansetron  (ZOFRAN -ODT) 4 MG disintegrating tablet Take 1 tablet (4 mg total) by mouth every 8 (eight) hours as needed for nausea or vomiting. 10/06/23   Tullo, Teresa L, MD  triamcinolone  cream (KENALOG ) 0.1 % APPLY TO AFFECTED AREA TWICE A DAY Patient taking differently: Apply 1 Application topically daily as needed. 02/09/20   Marylynn Verneita CROME, MD    Family History Family History  Problem Relation Age of Onset   Alcohol abuse Mother    Obesity Mother    Prostate cancer Father    Diverticulitis Father    Alcoholism Father    Alcohol abuse Father    Anxiety disorder Father    Cancer Father    Depression Father    Breast cancer Maternal  Grandmother    Skin cancer Maternal Grandmother    Heart attack Maternal Grandfather    Diabetes Maternal Grandfather    Hyperlipidemia Paternal Grandfather    Hypertension Paternal Grandfather    ADD / ADHD Brother    Alcohol abuse Sister    Anxiety disorder Sister    Stomach cancer Neg Hx    Esophageal cancer Neg Hx    Colon cancer Neg Hx    Colon polyps Neg Hx    Rectal cancer Neg Hx     Social History Social History   Tobacco Use   Smoking status: Never   Smokeless tobacco: Never  Vaping Use   Vaping status: Never Used  Substance Use Topics   Alcohol use: Yes    Alcohol/week: 2.0 standard drinks of alcohol  Types: 2 Glasses of wine per week    Comment: ocassional   Drug use: No     Allergies   Augmentin  [amoxicillin -pot clavulanate]   Review of Systems Review of Systems  Constitutional:  Negative for chills and fever.  HENT:  Positive for congestion, postnasal drip, sinus pressure and sinus pain. Negative for ear pain and sore throat.   Respiratory:  Positive for cough. Negative for shortness of breath.      Physical Exam Triage Vital Signs ED Triage Vitals  Encounter Vitals Group     BP 08/04/24 1927 129/87     Girls Systolic BP Percentile --      Girls Diastolic BP Percentile --      Boys Systolic BP Percentile --      Boys Diastolic BP Percentile --      Pulse Rate 08/04/24 1927 97     Resp 08/04/24 1927 18     Temp 08/04/24 1927 98 F (36.7 C)     Temp src --      SpO2 08/04/24 1927 98 %     Weight --      Height --      Head Circumference --      Peak Flow --      Pain Score 08/04/24 1903 0     Pain Loc --      Pain Education --      Exclude from Growth Chart --    No data found.  Updated Vital Signs BP 129/87   Pulse 97   Temp 98 F (36.7 C)   Resp 18   LMP 10/25/2011   SpO2 98%   Visual Acuity Right Eye Distance:   Left Eye Distance:   Bilateral Distance:    Right Eye Near:   Left Eye Near:    Bilateral Near:      Physical Exam Constitutional:      General: She is not in acute distress. HENT:     Right Ear: Tympanic membrane normal.     Left Ear: Tympanic membrane normal.     Nose: Congestion present.     Mouth/Throat:     Mouth: Mucous membranes are moist.     Pharynx: Oropharynx is clear.  Cardiovascular:     Rate and Rhythm: Normal rate and regular rhythm.     Heart sounds: Normal heart sounds.  Pulmonary:     Effort: Pulmonary effort is normal. No respiratory distress.     Breath sounds: Normal breath sounds.  Neurological:     Mental Status: She is alert.      UC Treatments / Results  Labs (all labs ordered are listed, but only abnormal results are displayed) Labs Reviewed - No data to display  EKG   Radiology No results found.  Procedures Procedures (including critical care time)  Medications Ordered in UC Medications - No data to display  Initial Impression / Assessment and Plan / UC Course  I have reviewed the triage vital signs and the nursing notes.  Pertinent labs & imaging results that were available during my care of the patient were reviewed by me and considered in my medical decision making (see chart for details).    Acute sinusitis.  Afebrile and vital signs are stable.  Lungs are clear and O2 sat is 98% on room air.  Patient has been symptomatic for 2 weeks.  She is allergic to Augmentin .  Treating today with Bactrim.  Education provided on sinus infection.  Instructed her  to follow-up with her PCP if she is not improving.  She agrees to plan of care. Final Clinical Impressions(s) / UC Diagnoses   Final diagnoses:  Acute non-recurrent maxillary sinusitis     Discharge Instructions      Take the Bactrim as directed.  Follow-up with your primary care provider if your symptoms are not improving.      ED Prescriptions     Medication Sig Dispense Auth. Provider   sulfamethoxazole -trimethoprim (BACTRIM DS) 800-160 MG tablet Take 1 tablet by mouth  2 (two) times daily for 7 days. 14 tablet Corlis Burnard DEL, NP      PDMP not reviewed this encounter.   Corlis Burnard DEL, NP 08/04/24 [phone]

## 2024-08-23 DIAGNOSIS — J3081 Allergic rhinitis due to animal (cat) (dog) hair and dander: Secondary | ICD-10-CM | POA: Diagnosis not present

## 2024-08-23 DIAGNOSIS — J301 Allergic rhinitis due to pollen: Secondary | ICD-10-CM | POA: Diagnosis not present

## 2024-08-23 DIAGNOSIS — J3089 Other allergic rhinitis: Secondary | ICD-10-CM | POA: Diagnosis not present

## 2024-09-12 DIAGNOSIS — C20 Malignant neoplasm of rectum: Secondary | ICD-10-CM | POA: Diagnosis not present

## 2024-09-12 DIAGNOSIS — Z933 Colostomy status: Secondary | ICD-10-CM | POA: Diagnosis not present

## 2024-09-16 ENCOUNTER — Ambulatory Visit: Admitting: Internal Medicine

## 2024-09-16 VITALS — BP 118/80 | HR 97 | Ht 61.5 in | Wt 214.2 lb

## 2024-09-16 DIAGNOSIS — J1183 Influenza due to unidentified influenza virus with otitis media: Secondary | ICD-10-CM | POA: Diagnosis not present

## 2024-09-16 DIAGNOSIS — E781 Pure hyperglyceridemia: Secondary | ICD-10-CM

## 2024-09-16 DIAGNOSIS — G4710 Hypersomnia, unspecified: Secondary | ICD-10-CM | POA: Diagnosis not present

## 2024-09-16 DIAGNOSIS — Z0000 Encounter for general adult medical examination without abnormal findings: Secondary | ICD-10-CM

## 2024-09-16 DIAGNOSIS — Z90710 Acquired absence of both cervix and uterus: Secondary | ICD-10-CM | POA: Diagnosis not present

## 2024-09-16 DIAGNOSIS — K760 Fatty (change of) liver, not elsewhere classified: Secondary | ICD-10-CM

## 2024-09-16 DIAGNOSIS — Z0001 Encounter for general adult medical examination with abnormal findings: Secondary | ICD-10-CM

## 2024-09-16 DIAGNOSIS — K435 Parastomal hernia without obstruction or  gangrene: Secondary | ICD-10-CM | POA: Diagnosis not present

## 2024-09-16 DIAGNOSIS — Z6839 Body mass index (BMI) 39.0-39.9, adult: Secondary | ICD-10-CM

## 2024-09-16 DIAGNOSIS — E66812 Obesity, class 2: Secondary | ICD-10-CM | POA: Diagnosis not present

## 2024-09-16 DIAGNOSIS — E538 Deficiency of other specified B group vitamins: Secondary | ICD-10-CM | POA: Diagnosis not present

## 2024-09-16 MED ORDER — LEVOFLOXACIN 500 MG PO TABS: 500.0000 mg | ORAL_TABLET | Freq: Every day | ORAL | 0 refills | Status: AC

## 2024-09-16 MED ORDER — ESTRADIOL 1 MG PO TABS: 1.0000 mg | ORAL_TABLET | Freq: Every day | ORAL | 1 refills | Status: AC

## 2024-09-16 MED ORDER — AMPHETAMINE-DEXTROAMPHETAMINE 20 MG PO TABS: 20.0000 mg | ORAL_TABLET | Freq: Two times a day (BID) | ORAL | 0 refills | Status: AC

## 2024-09-16 MED ORDER — PREDNISONE 10 MG PO TABS: ORAL_TABLET | ORAL | 0 refills | Status: AC

## 2024-09-16 NOTE — Assessment & Plan Note (Signed)

## 2024-09-16 NOTE — Progress Notes (Unsigned)
 Patient ID: Rachael Cowan, female    DOB: [birth year]  Age: 38 y.o. MRN: [id number]  The patient is here for annual preventive examination and management of other chronic and acute problems.   The risk factors are reflected in the social history.   The roster of all physicians providing medical care to patient - is listed in the Snapshot section of the chart.   Activities of daily living:  The patient is 100% independent in all ADLs: dressing, toileting, feeding as well as independent mobility   Home safety : The patient has smoke detectors in the home. They wear seatbelts.  There are no unsecured firearms at home. There is no violence in the home.    There is no risks for hepatitis, STDs or HIV. There is no   history of blood transfusion. They have no travel history to infectious disease endemic areas of the world.   The patient has seen their dentist in the last six month. They have seen their eye doctor in the last year. The patinet  denies slight hearing difficulty with regard to whispered voices and some television programs.  They have deferred audiologic testing in the last year.  They do not  have excessive sun exposure. Discussed the need for sun protection: hats, long sleeves and use of sunscreen if there is significant sun exposure.    Diet: the importance of a healthy diet is discussed. They do have a healthy diet.   The benefits of regular aerobic exercise were discussed. The patient  exercises  3 to 5 days per week  for  60 minutes.    Depression screen: there are no signs or vegative symptoms of depression- irritability, change in appetite, anhedonia, sadness/tearfullness.   The following portions of the patient's history were reviewed and updated as appropriate: allergies, current medications, past family history, past medical history,  past surgical history, past social history  and problem list.   Visual acuity was not assessed per patient preference since the patient has  regular follow up with an  ophthalmologist. Hearing and body mass index were assessed and reviewed.    During the course of the visit the patient was educated and counseled about appropriate screening and preventive services including : fall prevention , diabetes screening, nutrition counseling, colorectal cancer screening, and recommended immunizations.    Chief Complaint:   1) persistent sinus congestion and pain following infection with acute influenza   2) parastoma hernia: referred by wound ostomy rn to Rachael Cowan in [place]   3) allerlgic rhinitis:  receiving monthly injections    Review of Symptoms  Patient denies headache, fevers, malaise, unintentional weight loss, skin rash, eye pain, sinus congestion and sinus pain, sore throat, dysphagia,  hemoptysis , cough, dyspnea, wheezing, chest pain, palpitations, orthopnea, edema, abdominal pain, nausea, melena, diarrhea, constipation, flank pain, dysuria, hematuria, urinary  Frequency, nocturia, numbness, tingling, seizures,  Focal weakness, Loss of consciousness,  Tremor, insomnia, depression, anxiety, and suicidal ideation.    Physical Exam:  LMP 10/25/2011    Physical Exam  Assessment and Plan: There are no diagnoses linked to this encounter.  No follow-ups on file.  [name], MD

## 2024-09-16 NOTE — Assessment & Plan Note (Signed)
 Empiric treatment with prednisone , levaquin , and topical decongestants

## 2024-09-16 NOTE — Patient Instructions (Signed)
 I am treating you for sinusitis/otitis which is a complication from your viral infection due to  persistent sinus congestion.   I am prescribing an antibiotic (levaquin) and a prednisone  taper  To manage the infection and the inflammation in your ear/sinuses.   I also advise use of the following OTC meds to help with your other symptoms.   Use  Afrin nasal spray twice  daily for 3 days,  then once daily for 2 days  then stop   flush your sinuses twice daily with Simply Saline (do over the sink because if you do it right you will spit out globs of mucus)  Gargle with salt water as needed for sore throat.

## 2024-09-18 NOTE — Assessment & Plan Note (Signed)
 Seconcdry to narcolepsy managed with adderall  twice daily

## 2024-09-18 NOTE — Assessment & Plan Note (Signed)
 Noted on recent CT Abd/pelvis during workup for abdominal pain now resolved.  H/o partial colectomy and colostomy done remotely at [facility], patient has been referred to hernia specialist at [facility], but  has deferred treatment  for now

## 2024-09-18 NOTE — Assessment & Plan Note (Signed)
 Presumed by ultrasound changes and negative serologies to rule out autoimmune causes of hepatitis.  Current liver enzymes are  normalized  all modifiable risk factors including obesity, screening for diabetes and hyperlipidemia have been addressed .    Fibrosis 4 Score = .63 (Low risk)        Interpretation for patients with NAFLD          <1.30       -  F0-F1 (Low risk)          1.30-2.67 -  Indeterminate           >2.67      -  F3-F4 (High risk)     Validated for ages 41-65      Score is based on outdated labs. ALT, AST, and platelets should all be measured within the last 6 months for an accurate FIB-4 Score   Lab Results  Component Value Date   ALT 28 03/16/2024   AST 19 03/16/2024   ALKPHOS 73 03/16/2024   BILITOT 0.4 03/16/2024

## 2025-03-17 ENCOUNTER — Ambulatory Visit: Admitting: Internal Medicine
